# Patient Record
Sex: Female | Born: 1950
Health system: Southern US, Community
[De-identification: ages and names within clinical notes are randomized; demographics above are authoritative.]

## PROBLEM LIST (undated history)

## (undated) DIAGNOSIS — Z973 Presence of spectacles and contact lenses: Secondary | ICD-10-CM

## (undated) DIAGNOSIS — J84112 Idiopathic pulmonary fibrosis: Secondary | ICD-10-CM

## (undated) DIAGNOSIS — E039 Hypothyroidism, unspecified: Secondary | ICD-10-CM

## (undated) DIAGNOSIS — M1990 Unspecified osteoarthritis, unspecified site: Secondary | ICD-10-CM

## (undated) DIAGNOSIS — F419 Anxiety disorder, unspecified: Secondary | ICD-10-CM

## (undated) HISTORY — PX: OTHER SURGICAL HISTORY: SHX169

---

## 2001-06-20 HISTORY — PX: VAGINAL HYSTERECTOMY: SUR661

## 2015-08-25 DIAGNOSIS — Z1231 Encounter for screening mammogram for malignant neoplasm of breast: Secondary | ICD-10-CM | POA: Diagnosis not present

## 2016-04-11 DIAGNOSIS — Z23 Encounter for immunization: Secondary | ICD-10-CM | POA: Diagnosis not present

## 2016-05-25 DIAGNOSIS — Z23 Encounter for immunization: Secondary | ICD-10-CM | POA: Diagnosis not present

## 2016-05-25 DIAGNOSIS — Z0000 Encounter for general adult medical examination without abnormal findings: Secondary | ICD-10-CM | POA: Diagnosis not present

## 2016-05-25 DIAGNOSIS — F329 Major depressive disorder, single episode, unspecified: Secondary | ICD-10-CM | POA: Diagnosis not present

## 2016-05-25 DIAGNOSIS — M5030 Other cervical disc degeneration, unspecified cervical region: Secondary | ICD-10-CM | POA: Diagnosis not present

## 2016-05-25 DIAGNOSIS — E7800 Pure hypercholesterolemia, unspecified: Secondary | ICD-10-CM | POA: Diagnosis not present

## 2016-05-25 DIAGNOSIS — F411 Generalized anxiety disorder: Secondary | ICD-10-CM | POA: Diagnosis not present

## 2016-05-25 DIAGNOSIS — E039 Hypothyroidism, unspecified: Secondary | ICD-10-CM | POA: Diagnosis not present

## 2016-05-25 DIAGNOSIS — Z6826 Body mass index (BMI) 26.0-26.9, adult: Secondary | ICD-10-CM | POA: Diagnosis not present

## 2016-08-01 DIAGNOSIS — H6692 Otitis media, unspecified, left ear: Secondary | ICD-10-CM | POA: Diagnosis not present

## 2016-08-01 DIAGNOSIS — H7292 Unspecified perforation of tympanic membrane, left ear: Secondary | ICD-10-CM | POA: Diagnosis not present

## 2016-08-05 DIAGNOSIS — E7800 Pure hypercholesterolemia, unspecified: Secondary | ICD-10-CM | POA: Diagnosis not present

## 2016-09-01 ENCOUNTER — Ambulatory Visit (INDEPENDENT_AMBULATORY_CARE_PROVIDER_SITE_OTHER): Payer: Medicare Other | Admitting: Otolaryngology

## 2016-09-01 DIAGNOSIS — H6983 Other specified disorders of Eustachian tube, bilateral: Secondary | ICD-10-CM

## 2016-09-01 DIAGNOSIS — H903 Sensorineural hearing loss, bilateral: Secondary | ICD-10-CM

## 2017-03-21 DIAGNOSIS — Z1231 Encounter for screening mammogram for malignant neoplasm of breast: Secondary | ICD-10-CM | POA: Diagnosis not present

## 2017-04-05 DIAGNOSIS — Z23 Encounter for immunization: Secondary | ICD-10-CM | POA: Diagnosis not present

## 2017-04-26 DIAGNOSIS — R194 Change in bowel habit: Secondary | ICD-10-CM | POA: Diagnosis not present

## 2017-04-26 DIAGNOSIS — M25552 Pain in left hip: Secondary | ICD-10-CM | POA: Diagnosis not present

## 2017-04-26 DIAGNOSIS — M25551 Pain in right hip: Secondary | ICD-10-CM | POA: Diagnosis not present

## 2017-04-26 DIAGNOSIS — R1084 Generalized abdominal pain: Secondary | ICD-10-CM | POA: Diagnosis not present

## 2017-04-26 DIAGNOSIS — E039 Hypothyroidism, unspecified: Secondary | ICD-10-CM | POA: Diagnosis not present

## 2017-05-05 DIAGNOSIS — K921 Melena: Secondary | ICD-10-CM | POA: Diagnosis not present

## 2017-05-31 DIAGNOSIS — M76899 Other specified enthesopathies of unspecified lower limb, excluding foot: Secondary | ICD-10-CM | POA: Insufficient documentation

## 2017-05-31 DIAGNOSIS — M160 Bilateral primary osteoarthritis of hip: Secondary | ICD-10-CM | POA: Diagnosis not present

## 2017-05-31 DIAGNOSIS — M7061 Trochanteric bursitis, right hip: Secondary | ICD-10-CM | POA: Diagnosis not present

## 2017-05-31 DIAGNOSIS — M25552 Pain in left hip: Secondary | ICD-10-CM | POA: Diagnosis not present

## 2017-05-31 DIAGNOSIS — M25551 Pain in right hip: Secondary | ICD-10-CM | POA: Diagnosis not present

## 2017-07-14 DIAGNOSIS — M7062 Trochanteric bursitis, left hip: Secondary | ICD-10-CM | POA: Diagnosis not present

## 2017-07-14 DIAGNOSIS — M7061 Trochanteric bursitis, right hip: Secondary | ICD-10-CM | POA: Diagnosis not present

## 2017-08-30 DIAGNOSIS — M25551 Pain in right hip: Secondary | ICD-10-CM | POA: Diagnosis not present

## 2017-08-30 DIAGNOSIS — M160 Bilateral primary osteoarthritis of hip: Secondary | ICD-10-CM | POA: Diagnosis not present

## 2017-08-30 DIAGNOSIS — M7062 Trochanteric bursitis, left hip: Secondary | ICD-10-CM | POA: Diagnosis not present

## 2017-08-30 DIAGNOSIS — M5410 Radiculopathy, site unspecified: Secondary | ICD-10-CM | POA: Diagnosis not present

## 2017-08-30 DIAGNOSIS — M25552 Pain in left hip: Secondary | ICD-10-CM | POA: Diagnosis not present

## 2017-08-30 DIAGNOSIS — M7061 Trochanteric bursitis, right hip: Secondary | ICD-10-CM | POA: Diagnosis not present

## 2017-10-18 DIAGNOSIS — M25552 Pain in left hip: Secondary | ICD-10-CM | POA: Diagnosis not present

## 2017-10-18 DIAGNOSIS — M7061 Trochanteric bursitis, right hip: Secondary | ICD-10-CM | POA: Diagnosis not present

## 2017-10-18 DIAGNOSIS — M25551 Pain in right hip: Secondary | ICD-10-CM | POA: Diagnosis not present

## 2017-10-18 DIAGNOSIS — M160 Bilateral primary osteoarthritis of hip: Secondary | ICD-10-CM | POA: Diagnosis not present

## 2017-10-30 DIAGNOSIS — K5900 Constipation, unspecified: Secondary | ICD-10-CM | POA: Diagnosis not present

## 2017-10-30 DIAGNOSIS — Z780 Asymptomatic menopausal state: Secondary | ICD-10-CM | POA: Diagnosis not present

## 2017-10-30 DIAGNOSIS — E039 Hypothyroidism, unspecified: Secondary | ICD-10-CM | POA: Diagnosis not present

## 2017-10-30 DIAGNOSIS — F418 Other specified anxiety disorders: Secondary | ICD-10-CM | POA: Diagnosis not present

## 2017-10-30 DIAGNOSIS — E7800 Pure hypercholesterolemia, unspecified: Secondary | ICD-10-CM | POA: Diagnosis not present

## 2017-10-30 DIAGNOSIS — G479 Sleep disorder, unspecified: Secondary | ICD-10-CM | POA: Diagnosis not present

## 2017-10-30 DIAGNOSIS — M25552 Pain in left hip: Secondary | ICD-10-CM | POA: Diagnosis not present

## 2017-10-30 DIAGNOSIS — Z1159 Encounter for screening for other viral diseases: Secondary | ICD-10-CM | POA: Diagnosis not present

## 2017-10-30 DIAGNOSIS — Z23 Encounter for immunization: Secondary | ICD-10-CM | POA: Diagnosis not present

## 2017-10-30 DIAGNOSIS — Z13228 Encounter for screening for other metabolic disorders: Secondary | ICD-10-CM | POA: Diagnosis not present

## 2017-10-30 DIAGNOSIS — M25551 Pain in right hip: Secondary | ICD-10-CM | POA: Diagnosis not present

## 2017-10-30 DIAGNOSIS — Z130 Encounter for screening for diseases of the blood and blood-forming organs and certain disorders involving the immune mechanism: Secondary | ICD-10-CM | POA: Diagnosis not present

## 2017-11-16 DIAGNOSIS — R0602 Shortness of breath: Secondary | ICD-10-CM | POA: Diagnosis not present

## 2017-11-16 DIAGNOSIS — R05 Cough: Secondary | ICD-10-CM | POA: Diagnosis not present

## 2017-11-16 DIAGNOSIS — J40 Bronchitis, not specified as acute or chronic: Secondary | ICD-10-CM | POA: Diagnosis not present

## 2017-11-16 DIAGNOSIS — H10023 Other mucopurulent conjunctivitis, bilateral: Secondary | ICD-10-CM | POA: Diagnosis not present

## 2017-11-16 DIAGNOSIS — R079 Chest pain, unspecified: Secondary | ICD-10-CM | POA: Diagnosis not present

## 2017-11-16 DIAGNOSIS — R062 Wheezing: Secondary | ICD-10-CM | POA: Diagnosis not present

## 2017-11-29 DIAGNOSIS — M160 Bilateral primary osteoarthritis of hip: Secondary | ICD-10-CM | POA: Diagnosis not present

## 2017-11-29 DIAGNOSIS — Z6825 Body mass index (BMI) 25.0-25.9, adult: Secondary | ICD-10-CM | POA: Diagnosis not present

## 2017-11-29 DIAGNOSIS — M25551 Pain in right hip: Secondary | ICD-10-CM | POA: Diagnosis not present

## 2017-11-29 DIAGNOSIS — E039 Hypothyroidism, unspecified: Secondary | ICD-10-CM | POA: Diagnosis not present

## 2017-11-29 DIAGNOSIS — G4700 Insomnia, unspecified: Secondary | ICD-10-CM | POA: Diagnosis not present

## 2017-11-29 DIAGNOSIS — R079 Chest pain, unspecified: Secondary | ICD-10-CM | POA: Diagnosis not present

## 2017-11-29 DIAGNOSIS — M7061 Trochanteric bursitis, right hip: Secondary | ICD-10-CM | POA: Diagnosis not present

## 2017-11-29 DIAGNOSIS — M7062 Trochanteric bursitis, left hip: Secondary | ICD-10-CM | POA: Diagnosis not present

## 2017-11-29 DIAGNOSIS — F329 Major depressive disorder, single episode, unspecified: Secondary | ICD-10-CM | POA: Diagnosis not present

## 2017-11-29 DIAGNOSIS — R942 Abnormal results of pulmonary function studies: Secondary | ICD-10-CM | POA: Diagnosis not present

## 2017-12-14 DIAGNOSIS — R918 Other nonspecific abnormal finding of lung field: Secondary | ICD-10-CM | POA: Diagnosis not present

## 2018-01-08 DIAGNOSIS — R911 Solitary pulmonary nodule: Secondary | ICD-10-CM | POA: Diagnosis not present

## 2018-01-08 DIAGNOSIS — R942 Abnormal results of pulmonary function studies: Secondary | ICD-10-CM | POA: Diagnosis not present

## 2018-01-12 DIAGNOSIS — R0989 Other specified symptoms and signs involving the circulatory and respiratory systems: Secondary | ICD-10-CM | POA: Diagnosis not present

## 2018-01-12 DIAGNOSIS — R0602 Shortness of breath: Secondary | ICD-10-CM | POA: Diagnosis not present

## 2018-01-12 LAB — PULMONARY FUNCTION TEST

## 2018-01-29 DIAGNOSIS — M7061 Trochanteric bursitis, right hip: Secondary | ICD-10-CM | POA: Diagnosis not present

## 2018-03-12 DIAGNOSIS — L03116 Cellulitis of left lower limb: Secondary | ICD-10-CM | POA: Diagnosis not present

## 2018-03-12 DIAGNOSIS — Z23 Encounter for immunization: Secondary | ICD-10-CM | POA: Diagnosis not present

## 2018-03-12 DIAGNOSIS — S81802A Unspecified open wound, left lower leg, initial encounter: Secondary | ICD-10-CM | POA: Diagnosis not present

## 2018-03-19 DIAGNOSIS — E039 Hypothyroidism, unspecified: Secondary | ICD-10-CM | POA: Diagnosis not present

## 2018-03-19 DIAGNOSIS — R739 Hyperglycemia, unspecified: Secondary | ICD-10-CM | POA: Diagnosis not present

## 2018-03-19 DIAGNOSIS — R942 Abnormal results of pulmonary function studies: Secondary | ICD-10-CM | POA: Diagnosis not present

## 2018-03-23 DIAGNOSIS — E039 Hypothyroidism, unspecified: Secondary | ICD-10-CM | POA: Diagnosis not present

## 2018-03-23 DIAGNOSIS — Z23 Encounter for immunization: Secondary | ICD-10-CM | POA: Diagnosis not present

## 2018-03-23 DIAGNOSIS — F329 Major depressive disorder, single episode, unspecified: Secondary | ICD-10-CM | POA: Diagnosis not present

## 2018-03-23 DIAGNOSIS — R942 Abnormal results of pulmonary function studies: Secondary | ICD-10-CM | POA: Diagnosis not present

## 2018-03-23 DIAGNOSIS — G4700 Insomnia, unspecified: Secondary | ICD-10-CM | POA: Diagnosis not present

## 2018-03-23 DIAGNOSIS — Z6825 Body mass index (BMI) 25.0-25.9, adult: Secondary | ICD-10-CM | POA: Diagnosis not present

## 2018-03-29 DIAGNOSIS — M7062 Trochanteric bursitis, left hip: Secondary | ICD-10-CM | POA: Diagnosis not present

## 2018-03-29 DIAGNOSIS — M7061 Trochanteric bursitis, right hip: Secondary | ICD-10-CM | POA: Diagnosis not present

## 2018-05-08 DIAGNOSIS — E039 Hypothyroidism, unspecified: Secondary | ICD-10-CM | POA: Diagnosis not present

## 2018-05-27 DIAGNOSIS — M7062 Trochanteric bursitis, left hip: Secondary | ICD-10-CM | POA: Insufficient documentation

## 2018-05-27 DIAGNOSIS — M7061 Trochanteric bursitis, right hip: Secondary | ICD-10-CM | POA: Insufficient documentation

## 2018-05-28 DIAGNOSIS — M7062 Trochanteric bursitis, left hip: Secondary | ICD-10-CM | POA: Diagnosis not present

## 2018-05-28 DIAGNOSIS — M1611 Unilateral primary osteoarthritis, right hip: Secondary | ICD-10-CM | POA: Diagnosis not present

## 2018-05-28 DIAGNOSIS — M7061 Trochanteric bursitis, right hip: Secondary | ICD-10-CM | POA: Diagnosis not present

## 2018-05-28 DIAGNOSIS — M169 Osteoarthritis of hip, unspecified: Secondary | ICD-10-CM | POA: Insufficient documentation

## 2018-06-22 NOTE — H&P (Signed)
TOTAL HIP ADMISSION H&P  Patient is admitted for right total hip arthroplasty.  Subjective:  Chief Complaint: right hip pain  HPI: Debra Jimenez, 68 y.o. female, has a history of pain and functional disability in the right hip(s) due to arthritis and patient has failed non-surgical conservative treatments for greater than 12 weeks to include NSAID's and/or analgesics and activity modification.  Onset of symptoms was gradual starting 1 years ago with gradually worsening course since that time.The patient noted no past surgery on the right hip(s).  Patient currently rates pain in the right hip at 10 out of 10 with activity. Patient has worsening of pain with activity and weight bearing and pain that interfers with activities of daily living.  BILATERAL HIP X-RAY images were ordered, obtained, and interpreted on 05/28/2018 by Dr. Charlann Boxer. Images demonstrate no fracture, no dislocation and advanced degenerative changes with complete loss of joint space in the right hip with periarticular osteophytes and subchondral cystic changes. Her left hip joint space is relatively better preserved consistent with her examination.  This condition presents safety issues increasing the risk of falls.  There is no current active infection.  There are no active problems to display for this patient.  History reviewed. No pertinent past medical history.  History reviewed. No pertinent surgical history.  No current facility-administered medications for this encounter.    Current Outpatient Medications  Medication Sig Dispense Refill Last Dose  . escitalopram (LEXAPRO) 10 MG tablet Take 10 mg by mouth daily.     Marland Kitchen ibuprofen (ADVIL,MOTRIN) 800 MG tablet Take 800 mg by mouth 2 (two) times daily as needed for moderate pain.     Marland Kitchen levothyroxine (SYNTHROID, LEVOTHROID) 88 MCG tablet Take 88 mcg by mouth daily before breakfast.     . zolpidem (AMBIEN) 10 MG tablet Take 10 mg by mouth at bedtime.      No Known Allergies    Social History   Tobacco Use  . Smoking status: Not on file  Substance Use Topics  . Alcohol use: Not on file    History reviewed. No pertinent family history.   ROS  Objective:  Physical Exam  Vital signs in last 24 hours: BP: 132/85 mmHg Pulse: 72 bpm  Labs:   There is no height or weight on file to calculate BMI.   Imaging Review Plain radiographs demonstrate severe degenerative joint disease of the right hip(s). The bone quality appears to be adequate for age and reported activity level.    Preoperative templating of the joint replacement has been completed, documented, and submitted to the Operating Room personnel in order to optimize intra-operative equipment management.     Assessment/Plan:  End stage arthritis, right hip(s)  The patient history, physical examination, clinical judgement of the provider and imaging studies are consistent with end stage degenerative joint disease of the right hip(s) and total hip arthroplasty is deemed medically necessary. The treatment options including medical management, injection therapy, arthroscopy and arthroplasty were discussed at length. The risks and benefits of total hip arthroplasty were presented and reviewed. The risks due to aseptic loosening, infection, stiffness, dislocation/subluxation,  thromboembolic complications and other imponderables were discussed.  The patient acknowledged the explanation, agreed to proceed with the plan and consent was signed. Patient is being admitted for inpatient treatment for surgery, pain control, PT, OT, prophylactic antibiotics, VTE prophylaxis, progressive ambulation and ADL's and discharge planning.The patient is planning to be discharged home.  Dennie Bible, PA-C Orthopedic Surgery [facility] [place]

## 2018-06-25 DIAGNOSIS — Z6825 Body mass index (BMI) 25.0-25.9, adult: Secondary | ICD-10-CM | POA: Diagnosis not present

## 2018-06-25 DIAGNOSIS — H6092 Unspecified otitis externa, left ear: Secondary | ICD-10-CM | POA: Diagnosis not present

## 2018-07-02 DIAGNOSIS — Z1231 Encounter for screening mammogram for malignant neoplasm of breast: Secondary | ICD-10-CM | POA: Diagnosis not present

## 2018-07-09 ENCOUNTER — Encounter: Payer: Self-pay | Admitting: Student

## 2018-07-11 ENCOUNTER — Encounter (HOSPITAL_COMMUNITY): Payer: Self-pay

## 2018-07-11 ENCOUNTER — Encounter (HOSPITAL_COMMUNITY)
Admission: RE | Admit: 2018-07-11 | Discharge: 2018-07-11 | Disposition: A | Discharge status: Home / Self Care | Payer: Medicare Other | Source: Ambulatory Visit | Attending: Orthopedic Surgery | Admitting: Orthopedic Surgery

## 2018-07-11 ENCOUNTER — Other Ambulatory Visit: Payer: Self-pay

## 2018-07-11 DIAGNOSIS — Z01812 Encounter for preprocedural laboratory examination: Secondary | ICD-10-CM | POA: Diagnosis not present

## 2018-07-11 HISTORY — DX: Hypothyroidism, unspecified: E03.9

## 2018-07-11 HISTORY — DX: Presence of spectacles and contact lenses: Z97.3

## 2018-07-11 HISTORY — DX: Unspecified osteoarthritis, unspecified site: M19.90

## 2018-07-11 LAB — SURGICAL PCR SCREEN
MRSA, PCR: NEGATIVE
Staphylococcus aureus: POSITIVE — AB

## 2018-07-11 LAB — CBC
HCT: 40 % (ref 36.0–46.0)
Hemoglobin: 12.8 g/dL (ref 12.0–15.0)
MCH: 30.3 pg (ref 26.0–34.0)
MCHC: 32 g/dL (ref 30.0–36.0)
MCV: 94.6 fL (ref 80.0–100.0)
Platelets: 259 10*3/uL (ref 150–400)
RBC: 4.23 MIL/uL (ref 3.87–5.11)
RDW: 12.2 % (ref 11.5–15.5)
WBC: 8.5 10*3/uL (ref 4.0–10.5)
nRBC: 0 % (ref 0.0–0.2)

## 2018-07-11 LAB — ABO/RH: ABO/RH(D): A NEG

## 2018-07-11 NOTE — Patient Instructions (Signed)
Debra Jimenez  Feb 20, 1951    Your procedure is scheduled on:  07-17-2018   Report to [facility] Main  Entrance,  Report to admitting at  11:45 AM    Call this number if you have problems the morning of surgery [phone]        Remember: Do not eat food After Midnight.   Clear liquid diet from midnight until 8:15 AM day of surgery.  Nothing by mouth after 8:15 AM including water,candy,gum,mints.  BRUSH YOUR TEETH MORNING OF SURGERY AND RINSE YOUR MOUTH OUT       Take these medicines the morning of surgery with A SIP OF WATER:   Levothyroxine,  Escitalopram (lexapro)                                    You may not have any metal on your body including hair pins and               piercings  Do not wear jewelry, make-up, lotions, powders or perfumes, deodorant              Do not wear nail polish.  Do not shave  48 hours prior to surgery.                Do not bring valuables to the hospital. [facility] IS NOT             RESPONSIBLE   FOR VALUABLES.  Contacts, dentures or bridgework may not be worn into surgery.  Leave suitcase in the car. After surgery it may be brought to your room.    _____________________________________________________________________      CLEAR LIQUID DIET   Foods Allowed                                                                     Foods Excluded  Coffee and tea, regular and decaf                             liquids that you cannot  Plain Jell-O in any flavor                                             see through such as: Fruit ices (not with fruit pulp)                                     milk, soups, orange juice  Iced Popsicles                                    All solid food Carbonated beverages, regular and diet  Cranberry, grape and apple juices Sports drinks like Gatorade Lightly seasoned clear broth or consume(fat free) Sugar, honey syrup  Sample  Menu Breakfast                                Lunch                                     Supper Cranberry juice                    Beef broth                            Chicken broth Jell-O                                     Grape juice                           Apple juice Coffee or tea                        Jell-O                                      Popsicle                                                Coffee or tea                        Coffee or tea  _____________________________________________________________________            [facility] - Preparing for Surgery Before surgery, you can play an important role.  Because skin is not sterile, your skin needs to be as free of germs as possible.  You can reduce the number of germs on your skin by washing with CHG (chlorahexidine gluconate) soap before surgery.  CHG is an antiseptic cleaner which kills germs and bonds with the skin to continue killing germs even after washing. Please DO NOT use if you have an allergy to CHG or antibacterial soaps.  If your skin becomes reddened/irritated stop using the CHG and inform your nurse when you arrive at Short Stay. Do not shave (including legs and underarms) for at least 48 hours prior to the first CHG shower.  You may shave your face/neck. Please follow these instructions carefully:  1.  Shower with CHG Soap the night before surgery and the  morning of Surgery.  2.  If you choose to wash your hair, wash your hair first as usual with your  normal  shampoo.  3.  After you shampoo, rinse your hair and body thoroughly to remove the  shampoo.                            4.  Use CHG as you would any other liquid soap.  You can apply chg  directly  to the skin and wash                       Gently with a scrungie or clean washcloth.  5.  Apply the CHG Soap to your body ONLY FROM THE NECK DOWN.   Do not use on face/ open                           Wound or open sores. Avoid contact with eyes, ears mouth  and genitals (private parts).                       Wash face,  Genitals (private parts) with your normal soap.             6.  Wash thoroughly, paying special attention to the area where your surgery  will be performed.  7.  Thoroughly rinse your body with warm water from the neck down.  8.  DO NOT shower/wash with your normal soap after using and rinsing off  the CHG Soap.             9.  Pat yourself dry with a clean towel.            10.  Wear clean pajamas.            11.  Place clean sheets on your bed the night of your first shower and do not  sleep with pets. Day of Surgery : Do not apply any lotions/deodorants the morning of surgery.  Please wear clean clothes to the hospital/surgery center.  FAILURE TO FOLLOW THESE INSTRUCTIONS MAY RESULT IN THE CANCELLATION OF YOUR SURGERY PATIENT SIGNATURE_________________________________  NURSE SIGNATURE__________________________________  ________________________________________________________________________   Debra Jimenez  An incentive spirometer is a tool that can help keep your lungs clear and active. This tool measures how well you are filling your lungs with each breath. Taking long deep breaths may help reverse or decrease the chance of developing breathing (pulmonary) problems (especially infection) following:  A long period of time when you are unable to move or be active. BEFORE THE PROCEDURE   If the spirometer includes an indicator to show your best effort, your nurse or respiratory therapist will set it to a desired goal.  If possible, sit up straight or lean slightly forward. Try not to slouch.  Hold the incentive spirometer in an upright position. INSTRUCTIONS FOR USE  1. Sit on the edge of your bed if possible, or sit up as far as you can in bed or on a chair. 2. Hold the incentive spirometer in an upright position. 3. Breathe out normally. 4. Place the mouthpiece in your mouth and seal your lips tightly around  it. 5. Breathe in slowly and as deeply as possible, raising the piston or the ball toward the top of the column. 6. Hold your breath for 3-5 seconds or for as long as possible. Allow the piston or ball to fall to the bottom of the column. 7. Remove the mouthpiece from your mouth and breathe out normally. 8. Rest for a few seconds and repeat Steps 1 through 7 at least 10 times every 1-2 hours when you are awake. Take your time and take a few normal breaths between deep breaths. 9. The spirometer may include an indicator to show your best effort. Use the indicator as a goal to work toward during each repetition. 10. After each  set of 10 deep breaths, practice coughing to be sure your lungs are clear. If you have an incision (the cut made at the time of surgery), support your incision when coughing by placing a pillow or rolled up towels firmly against it. Once you are able to get out of bed, walk around indoors and cough well. You may stop using the incentive spirometer when instructed by your caregiver.  RISKS AND COMPLICATIONS  Take your time so you do not get dizzy or light-headed.  If you are in pain, you may need to take or ask for pain medication before doing incentive spirometry. It is harder to take a deep breath if you are having pain. AFTER USE  Rest and breathe slowly and easily.  It can be helpful to keep track of a log of your progress. Your caregiver can provide you with a simple table to help with this. If you are using the spirometer at home, follow these instructions: SEEK MEDICAL CARE IF:   You are having difficultly using the spirometer.  You have trouble using the spirometer as often as instructed.  Your pain medication is not giving enough relief while using the spirometer.  You develop fever of 100.5 F (38.1 C) or higher. SEEK IMMEDIATE MEDICAL CARE IF:   You cough up bloody sputum that had not been present before.  You develop fever of 102 F (38.9 C) or  greater.  You develop worsening pain at or near the incision site. MAKE SURE YOU:   Understand these instructions.  Will watch your condition.  Will get help right away if you are not doing well or get worse. Document Released: 10/17/2006 Document Revised: 08/29/2011 Document Reviewed: 12/18/2006 ExitCare Patient Information 2014 [employer], [place].   ________________________________________________________________________  WHAT IS A BLOOD TRANSFUSION? Blood Transfusion Information  A transfusion is the replacement of blood or some of its parts. Blood is made up of multiple cells which provide different functions.  Red blood cells carry oxygen and are used for blood loss replacement.  White blood cells fight against infection.  Platelets control bleeding.  Plasma helps clot blood.  Other blood products are available for specialized needs, such as hemophilia or other clotting disorders. BEFORE THE TRANSFUSION  Who gives blood for transfusions?   Healthy volunteers who are fully evaluated to make sure their blood is safe. This is blood bank blood. Transfusion therapy is the safest it has ever been in the practice of medicine. Before blood is taken from a donor, a complete history is taken to make sure that person has no history of diseases nor engages in risky social behavior (examples are intravenous drug use or sexual activity with multiple partners). The donor's travel history is screened to minimize risk of transmitting infections, such as malaria. The donated blood is tested for signs of infectious diseases, such as HIV and hepatitis. The blood is then tested to be sure it is compatible with you in order to minimize the chance of a transfusion reaction. If you or a relative donates blood, this is often done in anticipation of surgery and is not appropriate for emergency situations. It takes many days to process the donated blood. RISKS AND COMPLICATIONS Although transfusion therapy  is very safe and saves many lives, the main dangers of transfusion include:   Getting an infectious disease.  Developing a transfusion reaction. This is an allergic reaction to something in the blood you were given. Every precaution is taken to prevent this. The decision to have  a blood transfusion has been considered carefully by your caregiver before blood is given. Blood is not given unless the benefits outweigh the risks. AFTER THE TRANSFUSION  Right after receiving a blood transfusion, you will usually feel much better and more energetic. This is especially true if your red blood cells have gotten low (anemic). The transfusion raises the level of the red blood cells which carry oxygen, and this usually causes an energy increase.  The nurse administering the transfusion will monitor you carefully for complications. HOME CARE INSTRUCTIONS  No special instructions are needed after a transfusion. You may find your energy is better. Speak with your caregiver about any limitations on activity for underlying diseases you may have. SEEK MEDICAL CARE IF:   Your condition is not improving after your transfusion.  You develop redness or irritation at the intravenous (IV) site. SEEK IMMEDIATE MEDICAL CARE IF:  Any of the following symptoms occur over the next 12 hours:  Shaking chills.  You have a temperature by mouth above 102 F (38.9 C), not controlled by medicine.  Chest, back, or muscle pain.  People around you feel you are not acting correctly or are confused.  Shortness of breath or difficulty breathing.  Dizziness and fainting.  You get a rash or develop hives.  You have a decrease in urine output.  Your urine turns a dark color or changes to pink, red, or brown. Any of the following symptoms occur over the next 10 days:  You have a temperature by mouth above 102 F (38.9 C), not controlled by medicine.  Shortness of breath.  Weakness after normal activity.  The white  part of the eye turns yellow (jaundice).  You have a decrease in the amount of urine or are urinating less often.  Your urine turns a dark color or changes to pink, red, or brown. Document Released: 06/03/2000 Document Revised: 08/29/2011 Document Reviewed: 01/21/2008 [facility] Patient Information 2014 [place], [place].  _______________________________________________________________________

## 2018-07-12 NOTE — Progress Notes (Signed)
PCR result routed to dr Charlann Boxer in epic.

## 2018-07-17 ENCOUNTER — Observation Stay (HOSPITAL_COMMUNITY): Payer: Medicare Other

## 2018-07-17 ENCOUNTER — Inpatient Hospital Stay (HOSPITAL_COMMUNITY): Payer: Medicare Other | Admitting: Certified Registered"

## 2018-07-17 ENCOUNTER — Other Ambulatory Visit: Payer: Self-pay

## 2018-07-17 ENCOUNTER — Observation Stay (HOSPITAL_COMMUNITY)
Admission: RE | Admit: 2018-07-17 | Discharge: 2018-07-18 | Disposition: A | Discharge status: Home / Self Care | Payer: Medicare Other | Attending: Orthopedic Surgery | Admitting: Orthopedic Surgery

## 2018-07-17 ENCOUNTER — Inpatient Hospital Stay (HOSPITAL_COMMUNITY): Payer: Medicare Other

## 2018-07-17 ENCOUNTER — Encounter (HOSPITAL_COMMUNITY): Payer: Self-pay | Admitting: *Deleted

## 2018-07-17 ENCOUNTER — Inpatient Hospital Stay (HOSPITAL_COMMUNITY): Payer: Medicare Other | Admitting: Physician Assistant

## 2018-07-17 ENCOUNTER — Encounter (HOSPITAL_COMMUNITY)
Admission: RE | Disposition: A | Discharge status: Home / Self Care | Payer: Self-pay | Source: Home / Self Care | Attending: Orthopedic Surgery

## 2018-07-17 DIAGNOSIS — E039 Hypothyroidism, unspecified: Secondary | ICD-10-CM | POA: Diagnosis not present

## 2018-07-17 DIAGNOSIS — Z79890 Hormone replacement therapy (postmenopausal): Secondary | ICD-10-CM | POA: Diagnosis not present

## 2018-07-17 DIAGNOSIS — Z79899 Other long term (current) drug therapy: Secondary | ICD-10-CM | POA: Insufficient documentation

## 2018-07-17 DIAGNOSIS — Z419 Encounter for procedure for purposes other than remedying health state, unspecified: Secondary | ICD-10-CM

## 2018-07-17 DIAGNOSIS — Z87891 Personal history of nicotine dependence: Secondary | ICD-10-CM | POA: Diagnosis not present

## 2018-07-17 DIAGNOSIS — Z96641 Presence of right artificial hip joint: Secondary | ICD-10-CM

## 2018-07-17 DIAGNOSIS — M1611 Unilateral primary osteoarthritis, right hip: Secondary | ICD-10-CM | POA: Diagnosis not present

## 2018-07-17 DIAGNOSIS — Z96643 Presence of artificial hip joint, bilateral: Secondary | ICD-10-CM | POA: Diagnosis not present

## 2018-07-17 DIAGNOSIS — Z471 Aftercare following joint replacement surgery: Secondary | ICD-10-CM | POA: Diagnosis not present

## 2018-07-17 DIAGNOSIS — Z96649 Presence of unspecified artificial hip joint: Secondary | ICD-10-CM

## 2018-07-17 HISTORY — PX: TOTAL HIP ARTHROPLASTY: SHX124

## 2018-07-17 LAB — TYPE AND SCREEN
ABO/RH(D): A NEG
Antibody Screen: NEGATIVE

## 2018-07-17 SURGERY — ARTHROPLASTY, HIP, TOTAL, ANTERIOR APPROACH
Anesthesia: Monitor Anesthesia Care | Site: Hip | Laterality: Right

## 2018-07-17 MED ORDER — HYDROMORPHONE HCL 1 MG/ML IJ SOLN
INTRAMUSCULAR | Status: AC
  Filled 2018-07-17: qty 1

## 2018-07-17 MED ORDER — ACETAMINOPHEN 500 MG PO TABS
1000.0000 mg | ORAL_TABLET | Freq: Once | ORAL | Status: AC
  Administered 2018-07-17: 1000 mg via ORAL
  Filled 2018-07-17: qty 2

## 2018-07-17 MED ORDER — METOCLOPRAMIDE HCL 5 MG/ML IJ SOLN: 5.0000 mg | Freq: Three times a day (TID) | INTRAMUSCULAR | Status: DC | PRN

## 2018-07-17 MED ORDER — SODIUM CHLORIDE 0.9 % IV SOLN
INTRAVENOUS | Status: DC
  Administered 2018-07-17 – 2018-07-18 (×2): via INTRAVENOUS

## 2018-07-17 MED ORDER — DEXAMETHASONE SODIUM PHOSPHATE 10 MG/ML IJ SOLN
10.0000 mg | Freq: Once | INTRAMUSCULAR | Status: AC
  Administered 2018-07-17: 10 mg via INTRAVENOUS

## 2018-07-17 MED ORDER — PROPOFOL 10 MG/ML IV BOLUS
INTRAVENOUS | Status: AC
  Filled 2018-07-17: qty 40

## 2018-07-17 MED ORDER — SODIUM CHLORIDE 0.9 % IR SOLN
Status: DC | PRN
  Administered 2018-07-17: 1000 mL

## 2018-07-17 MED ORDER — FENTANYL CITRATE (PF) 100 MCG/2ML IJ SOLN
INTRAMUSCULAR | Status: DC | PRN
  Administered 2018-07-17 (×2): 25 ug via INTRAVENOUS

## 2018-07-17 MED ORDER — LACTATED RINGERS IV SOLN
INTRAVENOUS | Status: DC
  Administered 2018-07-17 (×3): via INTRAVENOUS

## 2018-07-17 MED ORDER — STERILE WATER FOR IRRIGATION IR SOLN
Status: DC | PRN
  Administered 2018-07-17: 2000 mL

## 2018-07-17 MED ORDER — METOCLOPRAMIDE HCL 5 MG PO TABS: 5.0000 mg | ORAL_TABLET | Freq: Three times a day (TID) | ORAL | Status: DC | PRN

## 2018-07-17 MED ORDER — CEFAZOLIN SODIUM-DEXTROSE 2-4 GM/100ML-% IV SOLN
2.0000 g | Freq: Four times a day (QID) | INTRAVENOUS | Status: AC
  Administered 2018-07-17 – 2018-07-18 (×2): 2 g via INTRAVENOUS
  Filled 2018-07-17 (×2): qty 100

## 2018-07-17 MED ORDER — ONDANSETRON HCL 4 MG/2ML IJ SOLN: 4.0000 mg | Freq: Four times a day (QID) | INTRAMUSCULAR | Status: DC | PRN

## 2018-07-17 MED ORDER — ZOLPIDEM TARTRATE 5 MG PO TABS
5.0000 mg | ORAL_TABLET | Freq: Every day | ORAL | Status: DC
  Administered 2018-07-17: 5 mg via ORAL
  Filled 2018-07-17: qty 1

## 2018-07-17 MED ORDER — METHOCARBAMOL 500 MG PO TABS
500.0000 mg | ORAL_TABLET | Freq: Four times a day (QID) | ORAL | Status: DC | PRN
  Administered 2018-07-17: 500 mg via ORAL
  Filled 2018-07-17: qty 1

## 2018-07-17 MED ORDER — ONDANSETRON HCL 4 MG PO TABS: 4.0000 mg | ORAL_TABLET | Freq: Four times a day (QID) | ORAL | Status: DC | PRN

## 2018-07-17 MED ORDER — DEXAMETHASONE SODIUM PHOSPHATE 10 MG/ML IJ SOLN: INTRAMUSCULAR | Status: DC | PRN

## 2018-07-17 MED ORDER — HYDROMORPHONE HCL 1 MG/ML IJ SOLN
0.5000 mg | INTRAMUSCULAR | Status: DC | PRN
  Administered 2018-07-17: 1 mg via INTRAVENOUS
  Filled 2018-07-17: qty 1

## 2018-07-17 MED ORDER — BISACODYL 10 MG RE SUPP: 10.0000 mg | Freq: Every day | RECTAL | Status: DC | PRN

## 2018-07-17 MED ORDER — MAGNESIUM CITRATE PO SOLN: 1.0000 | Freq: Once | ORAL | Status: DC | PRN

## 2018-07-17 MED ORDER — DEXAMETHASONE SODIUM PHOSPHATE 10 MG/ML IJ SOLN
10.0000 mg | Freq: Once | INTRAMUSCULAR | Status: AC
  Administered 2018-07-18: 10 mg via INTRAVENOUS
  Filled 2018-07-17: qty 1

## 2018-07-17 MED ORDER — ACETAMINOPHEN 325 MG PO TABS: 325.0000 mg | ORAL_TABLET | Freq: Four times a day (QID) | ORAL | Status: DC | PRN

## 2018-07-17 MED ORDER — METHOCARBAMOL 500 MG IVPB - SIMPLE MED
INTRAVENOUS | Status: AC
  Administered 2018-07-17: 500 mg via INTRAVENOUS
  Filled 2018-07-17: qty 50

## 2018-07-17 MED ORDER — MIDAZOLAM HCL 2 MG/2ML IJ SOLN
INTRAMUSCULAR | Status: DC | PRN
  Administered 2018-07-17: 2 mg via INTRAVENOUS

## 2018-07-17 MED ORDER — HYDROCODONE-ACETAMINOPHEN 5-325 MG PO TABS
1.0000 | ORAL_TABLET | ORAL | Status: DC | PRN
  Administered 2018-07-17 – 2018-07-18 (×5): 2 via ORAL
  Filled 2018-07-17 (×5): qty 2

## 2018-07-17 MED ORDER — PROPOFOL 500 MG/50ML IV EMUL
INTRAVENOUS | Status: DC | PRN
  Administered 2018-07-17: 50 ug/kg/min via INTRAVENOUS

## 2018-07-17 MED ORDER — METHOCARBAMOL 500 MG IVPB - SIMPLE MED
500.0000 mg | Freq: Four times a day (QID) | INTRAVENOUS | Status: DC | PRN
  Administered 2018-07-17: 500 mg via INTRAVENOUS
  Filled 2018-07-17: qty 50

## 2018-07-17 MED ORDER — CEFAZOLIN SODIUM-DEXTROSE 2-4 GM/100ML-% IV SOLN
2.0000 g | INTRAVENOUS | Status: AC
  Administered 2018-07-17: 2 g via INTRAVENOUS
  Filled 2018-07-17: qty 100

## 2018-07-17 MED ORDER — HYDROCODONE-ACETAMINOPHEN 7.5-325 MG PO TABS: 1.0000 | ORAL_TABLET | ORAL | Status: DC | PRN

## 2018-07-17 MED ORDER — PROPOFOL 10 MG/ML IV BOLUS
INTRAVENOUS | Status: DC | PRN
  Administered 2018-07-17 (×2): 20 mg via INTRAVENOUS

## 2018-07-17 MED ORDER — DOCUSATE SODIUM 100 MG PO CAPS
100.0000 mg | ORAL_CAPSULE | Freq: Two times a day (BID) | ORAL | Status: DC
  Administered 2018-07-17 – 2018-07-18 (×2): 100 mg via ORAL
  Filled 2018-07-17 (×2): qty 1

## 2018-07-17 MED ORDER — ONDANSETRON HCL 4 MG/2ML IJ SOLN
INTRAMUSCULAR | Status: AC
  Filled 2018-07-17: qty 2

## 2018-07-17 MED ORDER — CELECOXIB 200 MG PO CAPS
200.0000 mg | ORAL_CAPSULE | Freq: Two times a day (BID) | ORAL | Status: DC
  Administered 2018-07-17 – 2018-07-18 (×2): 200 mg via ORAL
  Filled 2018-07-17 (×2): qty 1

## 2018-07-17 MED ORDER — ALUM & MAG HYDROXIDE-SIMETH 200-200-20 MG/5ML PO SUSP: 15.0000 mL | ORAL | Status: DC | PRN

## 2018-07-17 MED ORDER — MENTHOL 3 MG MT LOZG: 1.0000 | LOZENGE | OROMUCOSAL | Status: DC | PRN

## 2018-07-17 MED ORDER — SODIUM CHLORIDE 0.9 % IV SOLN
INTRAVENOUS | Status: DC | PRN
  Administered 2018-07-17: 5 ug/min via INTRAVENOUS

## 2018-07-17 MED ORDER — POLYETHYLENE GLYCOL 3350 17 G PO PACK
17.0000 g | PACK | Freq: Two times a day (BID) | ORAL | Status: DC
  Administered 2018-07-18: 17 g via ORAL
  Filled 2018-07-17 (×2): qty 1

## 2018-07-17 MED ORDER — ONDANSETRON HCL 4 MG/2ML IJ SOLN
INTRAMUSCULAR | Status: DC | PRN
  Administered 2018-07-17: 4 mg via INTRAVENOUS

## 2018-07-17 MED ORDER — TRANEXAMIC ACID-NACL 1000-0.7 MG/100ML-% IV SOLN
1000.0000 mg | INTRAVENOUS | Status: AC
  Administered 2018-07-17: 1000 mg via INTRAVENOUS
  Filled 2018-07-17: qty 100

## 2018-07-17 MED ORDER — PHENOL 1.4 % MT LIQD
1.0000 | OROMUCOSAL | Status: DC | PRN
  Filled 2018-07-17: qty 177

## 2018-07-17 MED ORDER — CHLORHEXIDINE GLUCONATE 4 % EX LIQD: 60.0000 mL | Freq: Once | CUTANEOUS | Status: DC

## 2018-07-17 MED ORDER — FERROUS SULFATE 325 (65 FE) MG PO TABS
325.0000 mg | ORAL_TABLET | Freq: Three times a day (TID) | ORAL | Status: DC
  Administered 2018-07-18 (×2): 325 mg via ORAL
  Filled 2018-07-17 (×2): qty 1

## 2018-07-17 MED ORDER — LEVOTHYROXINE SODIUM 88 MCG PO TABS
88.0000 ug | ORAL_TABLET | Freq: Every day | ORAL | Status: DC
  Administered 2018-07-18: 88 ug via ORAL
  Filled 2018-07-17: qty 1

## 2018-07-17 MED ORDER — TRANEXAMIC ACID-NACL 1000-0.7 MG/100ML-% IV SOLN
1000.0000 mg | Freq: Once | INTRAVENOUS | Status: AC
  Administered 2018-07-17: 1000 mg via INTRAVENOUS
  Filled 2018-07-17: qty 100

## 2018-07-17 MED ORDER — HYDROMORPHONE HCL 1 MG/ML IJ SOLN
0.2500 mg | INTRAMUSCULAR | Status: DC | PRN
  Administered 2018-07-17: 0.5 mg via INTRAVENOUS

## 2018-07-17 MED ORDER — PHENYLEPHRINE HCL 10 MG/ML IJ SOLN
INTRAMUSCULAR | Status: DC | PRN
  Administered 2018-07-17: 60 ug via INTRAVENOUS

## 2018-07-17 MED ORDER — MIDAZOLAM HCL 2 MG/2ML IJ SOLN
INTRAMUSCULAR | Status: AC
  Filled 2018-07-17: qty 2

## 2018-07-17 MED ORDER — ASPIRIN 81 MG PO CHEW
81.0000 mg | CHEWABLE_TABLET | Freq: Two times a day (BID) | ORAL | Status: DC
  Administered 2018-07-17 – 2018-07-18 (×2): 81 mg via ORAL
  Filled 2018-07-17 (×2): qty 1

## 2018-07-17 MED ORDER — LIDOCAINE 2% (20 MG/ML) 5 ML SYRINGE
INTRAMUSCULAR | Status: DC | PRN
  Administered 2018-07-17: 40 mg via INTRAVENOUS

## 2018-07-17 MED ORDER — ESCITALOPRAM OXALATE 10 MG PO TABS
10.0000 mg | ORAL_TABLET | Freq: Every day | ORAL | Status: DC
  Administered 2018-07-18: 10 mg via ORAL
  Filled 2018-07-17: qty 1

## 2018-07-17 MED ORDER — DIPHENHYDRAMINE HCL 12.5 MG/5ML PO ELIX: 12.5000 mg | ORAL_SOLUTION | ORAL | Status: DC | PRN

## 2018-07-17 MED ORDER — FENTANYL CITRATE (PF) 100 MCG/2ML IJ SOLN
INTRAMUSCULAR | Status: AC
  Filled 2018-07-17: qty 2

## 2018-07-17 SURGICAL SUPPLY — 42 items
BAG ZIPLOCK 12X15 (MISCELLANEOUS) ×3 IMPLANT
BLADE SAG 18X100X1.27 (BLADE) ×3 IMPLANT
BLADE SURG SZ10 CARB STEEL (BLADE) ×6 IMPLANT
COVER PERINEAL POST (MISCELLANEOUS) ×3 IMPLANT
COVER SURGICAL LIGHT HANDLE (MISCELLANEOUS) ×3 IMPLANT
CUP ACETBLR 52 OD PINNACLE (Hips) ×3 IMPLANT
DERMABOND ADVANCED (GAUZE/BANDAGES/DRESSINGS) ×2
DERMABOND ADVANCED .7 DNX12 (GAUZE/BANDAGES/DRESSINGS) ×1 IMPLANT
DRAPE STERI IOBAN 125X83 (DRAPES) ×3 IMPLANT
DRAPE U-SHAPE 47X51 STRL (DRAPES) ×6 IMPLANT
DRESSING AQUACEL AG SP 3.5X10 (GAUZE/BANDAGES/DRESSINGS) ×1 IMPLANT
DRSG AQUACEL AG SP 3.5X10 (GAUZE/BANDAGES/DRESSINGS) ×3
DURAPREP 26ML APPLICATOR (WOUND CARE) ×3 IMPLANT
ELECT REM PT RETURN 15FT ADLT (MISCELLANEOUS) ×3 IMPLANT
ELIMINATOR HOLE APEX DEPUY (Hips) ×3 IMPLANT
GLOVE BIO SURGEON STRL SZ 6.5 (GLOVE) ×2 IMPLANT
GLOVE BIO SURGEONS STRL SZ 6.5 (GLOVE) ×1
GLOVE BIOGEL PI IND STRL 6.5 (GLOVE) ×1 IMPLANT
GLOVE BIOGEL PI IND STRL 7.5 (GLOVE) ×4 IMPLANT
GLOVE BIOGEL PI IND STRL 8.5 (GLOVE) ×1 IMPLANT
GLOVE BIOGEL PI INDICATOR 6.5 (GLOVE) ×2
GLOVE BIOGEL PI INDICATOR 7.5 (GLOVE) ×8
GLOVE BIOGEL PI INDICATOR 8.5 (GLOVE) ×2
GLOVE ECLIPSE 8.0 STRL XLNG CF (GLOVE) ×6 IMPLANT
GLOVE ORTHO TXT STRL SZ7.5 (GLOVE) ×3 IMPLANT
GLOVE SURG SS PI 7.0 STRL IVOR (GLOVE) ×3 IMPLANT
GOWN STRL REUS W/TWL 2XL LVL3 (GOWN DISPOSABLE) ×3 IMPLANT
GOWN STRL REUS W/TWL LRG LVL3 (GOWN DISPOSABLE) ×9 IMPLANT
HEAD CERAMIC 36 PLUS5 (Hips) ×3 IMPLANT
HOLDER FOLEY CATH W/STRAP (MISCELLANEOUS) ×3 IMPLANT
LINER NEUTRAL 52X36MM PLUS 4 (Liner) ×3 IMPLANT
PACK ANTERIOR HIP CUSTOM (KITS) ×3 IMPLANT
SCREW PINN CAN BONE 6.5MMX15MM (Screw) ×3 IMPLANT
STEM FEM SZ3 STD ACTIS (Stem) ×3 IMPLANT
SUT MNCRL AB 4-0 PS2 18 (SUTURE) ×3 IMPLANT
SUT STRATAFIX 0 PDS 27 VIOLET (SUTURE) ×3
SUT VIC AB 1 CT1 36 (SUTURE) ×9 IMPLANT
SUT VIC AB 2-0 CT1 27 (SUTURE) ×4
SUT VIC AB 2-0 CT1 TAPERPNT 27 (SUTURE) ×2 IMPLANT
SUTURE STRATFX 0 PDS 27 VIOLET (SUTURE) ×1 IMPLANT
TRAY FOLEY MTR SLVR 14FR STAT (SET/KITS/TRAYS/PACK) ×3 IMPLANT
YANKAUER SUCT BULB TIP 10FT TU (MISCELLANEOUS) ×3 IMPLANT

## 2018-07-17 NOTE — Anesthesia Postprocedure Evaluation (Signed)
Anesthesia Post Note  Patient: Debra Jimenez  Procedure(s) Performed: TOTAL HIP ARTHROPLASTY ANTERIOR APPROACH (Right Hip)     Patient location during evaluation: PACU Anesthesia Type: MAC and Spinal Level of consciousness: oriented and awake and alert Pain management: pain level controlled Vital Signs Assessment: post-procedure vital signs reviewed and stable Respiratory status: spontaneous breathing, respiratory function stable and patient connected to nasal cannula oxygen Cardiovascular status: blood pressure returned to baseline and stable Postop Assessment: no headache, no backache, no apparent nausea or vomiting, spinal receding and patient able to bend at knees Anesthetic complications: no    Last Vitals:  Vitals:   07/17/18 1545 07/17/18 1600  BP: 116/68 131/73  Pulse: 79 70  Resp: 17 16  Temp: 36.8 C 36.7 C  SpO2: 100% 99%    Last Pain:  Vitals:   07/17/18 1600  TempSrc: Oral  PainSc: 0-No pain                 Melven Stockard,W. EDMOND

## 2018-07-17 NOTE — Transfer of Care (Signed)
Immediate Anesthesia Transfer of Care Note  Patient: [name]  Procedure(s) Performed: TOTAL HIP ARTHROPLASTY ANTERIOR APPROACH (Right Hip)  Patient Location: PACU  Anesthesia Type:MAC and Spinal  Level of Consciousness: awake, alert , oriented and patient cooperative  Airway & Oxygen Therapy: Patient Spontanous Breathing and Patient connected to face mask oxygen  Post-op Assessment: Report given to RN and Post -op Vital signs reviewed and stable  Post vital signs: Reviewed and stable  Last Vitals:  Vitals Value Taken Time  BP 129/85 07/17/2018  3:04 PM  Temp    Pulse 96 07/17/2018  3:07 PM  Resp 18 07/17/2018  3:07 PM  SpO2 100 % 07/17/2018  3:07 PM  Vitals shown include unvalidated device data.  Last Pain:  Vitals:   07/17/18 1154  TempSrc: Oral  PainSc: 7       Patients Stated Pain Goal: 3 (62/13/08 6578)  Complications: No apparent anesthesia complications

## 2018-07-17 NOTE — Discharge Instructions (Signed)

## 2018-07-17 NOTE — Anesthesia Preprocedure Evaluation (Addendum)
Anesthesia Evaluation  Patient identified by MRN, date of birth, ID band Patient awake    Reviewed: Allergy & Precautions, H&P , NPO status , Patient's Chart, lab work & pertinent test results  Airway Mallampati: II  TM Distance: >3 FB Neck ROM: Full    Dental no notable dental hx. (+) Teeth Intact, Dental Advisory Given   Pulmonary neg pulmonary ROS, former smoker,    Pulmonary exam normal breath sounds clear to auscultation       Cardiovascular negative cardio ROS   Rhythm:Regular Rate:Normal     Neuro/Psych negative neurological ROS  negative psych ROS   GI/Hepatic negative GI ROS, Neg liver ROS,   Endo/Other  Hypothyroidism   Renal/GU negative Renal ROS  negative genitourinary   Musculoskeletal  (+) Arthritis , Osteoarthritis,    Abdominal   Peds  Hematology negative hematology ROS (+)   Anesthesia Other Findings   Reproductive/Obstetrics negative OB ROS                            Anesthesia Physical Anesthesia Plan  ASA: II  Anesthesia Plan: Spinal and MAC   Post-op Pain Management:    Induction: Intravenous  PONV Risk Score and Plan: 3 and Ondansetron, Dexamethasone, Propofol infusion and Midazolam  Airway Management Planned: Simple Face Mask  Additional Equipment:   Intra-op Plan:   Post-operative Plan:   Informed Consent: I have reviewed the patients History and Physical, chart, labs and discussed the procedure including the risks, benefits and alternatives for the proposed anesthesia with the patient or authorized representative who has indicated his/her understanding and acceptance.     Dental advisory given  Plan Discussed with: CRNA  Anesthesia Plan Comments:         Anesthesia Quick Evaluation

## 2018-07-17 NOTE — Op Note (Signed)
NAME:  Debra MinksLinda Arambula                ACCOUNT NO.: [id number]      MEDICAL RECORD NO.: [id number]      FACILITY:  [facility]      PHYSICIAN:  Shelda PalMatthew D [employer] Southers  DATE OF BIRTH:  [birth year]     DATE OF PROCEDURE:  07/17/2018                                 OPERATIVE REPORT         PREOPERATIVE DIAGNOSIS: Right  hip osteoarthritis.      POSTOPERATIVE DIAGNOSIS:  Right hip osteoarthritis.      PROCEDURE:  Right total hip replacement through an anterior approach   utilizing DePuy THR system, component size 52mm pinnacle cup, a size 36+4 neutral   Altrex liner, a size 3 standard Actis stem with a 36+5 delta ceramic   ball.      SURGEON:  Madlyn FrankelMatthew D. Charlann Boxerlin, M.D.      ASSISTANT:  Lanney GinsMatthew Babish, PA-C     ANESTHESIA:  Spinal.      SPECIMENS:  None.      COMPLICATIONS:  None.      BLOOD LOSS:  300 cc     DRAINS:  None.      INDICATION OF THE PROCEDURE:  Debra MinksLinda Jimenez is a 68 y.o. female who had   presented to office for evaluation of right hip pain.  Radiographs revealed   progressive degenerative changes with bone-on-bone   articulation of the  hip joint, including subchondral cystic changes and osteophytes.  The patient had painful limited range of   motion significantly affecting their overall quality of life and function.  The patient was failing to    respond to conservative measures including medications and/or injections and activity modification and at this point was ready   to proceed with more definitive measures.  Consent was obtained for   benefit of pain relief.  Specific risks of infection, DVT, component   failure, dislocation, neurovascular injury, and need for revision surgery were reviewed in the office as well discussion of   the anterior versus posterior approach were reviewed.     PROCEDURE IN DETAIL:  The patient was brought to operative theater.   Once adequate anesthesia, preoperative antibiotics, 2 gm of Ancef, 1 gm of Tranexamic Acid, and  10 mg of Decadron were administered, the patient was positioned supine on the [employer] table.  Once the patient was safely positioned with adequate padding of boney prominences we predraped out the hip, and used fluoroscopy to confirm orientation of the pelvis.      The right hip was then prepped and draped from proximal iliac crest to   mid thigh with a shower curtain technique.      Time-out was performed identifying the patient, planned procedure, and the appropriate extremity.     An incision was then made 2 cm lateral to the   anterior superior iliac spine extending over the orientation of the   tensor fascia lata muscle and sharp dissection was carried down to the   fascia of the muscle.      The fascia was then incised.  The muscle belly was identified and swept   laterally and retractor placed along the superior neck.  Following   cauterization of the circumflex vessels and removing some pericapsular  fat, a second cobra retractor was placed on the inferior neck.  A T-capsulotomy was made along the line of the   superior neck to the trochanteric fossa, then extended proximally and   distally.  Tag sutures were placed and the retractors were then placed   intracapsular.  We then identified the trochanteric fossa and   orientation of my neck cut and then made a neck osteotomy with the femur on traction.  The femoral   head was removed without difficulty or complication.  Traction was let   off and retractors were placed posterior and anterior around the   acetabulum.      The labrum and foveal tissue were debrided.  I began reaming with a 46 mm   reamer and reamed up to 51 mm reamer with good bony bed preparation and a 52 mm  cup was chosen.  The final 52 mm Pinnacle cup was then impacted under fluoroscopy to confirm the depth of penetration and orientation with respect to   Abduction and forward flexion.  A screw was placed into the ilium followed by the hole eliminator.  The  final   36+4 neutral Altrex liner was impacted with good visualized rim fit.  The cup was positioned anatomically within the acetabular portion of the pelvis.      At this point, the femur was rolled to 100 degrees.  Further capsule was   released off the inferior aspect of the femoral neck.  I then   released the superior capsule proximally.  With the leg in a neutral position the hook was placed laterally   along the femur under the vastus lateralis origin and elevated manually and then held in position using the hook attachment on the bed.  The leg was then extended and adducted with the leg rolled to 100   degrees of external rotation.  Retractors were placed along the medial calcar and posteriorly over the greater trochanter.  Once the proximal femur was fully   exposed, I used a box osteotome to set orientation.  I then began   broaching with the starting chili pepper broach and passed this by hand and then broached up to 3.  With the 3 broach in place I chose a standard offset neck and did several trial reductions.  The offset was appropriate, leg lengths   appeared to be equal best matched with the +5 head ball trial confirmed radiographically.   Given these findings, I went ahead and dislocated the hip, repositioned all   retractors and positioned the right hip in the extended and abducted position.  The final 3 standard Actis femoral stem was   chosen and it was impacted down to the level of neck cut.  Based on this   and the trial reductions, a final 36+5 delta ceramic ball was chosen and   impacted onto a clean and dry trunnion, and the hip was reduced.  The   hip had been irrigated throughout the case again at this point.  I did   reapproximate the superior capsular leaflet to the anterior leaflet   using #1 Vicryl.  The fascia of the   tensor fascia lata muscle was then reapproximated using #1 Vicryl and #0 Stratafix sutures.  The   remaining wound was closed with 2-0 Vicryl and  running 4-0 Monocryl.   The hip was cleaned, dried, and dressed sterilely using Dermabond and   Aquacel dressing.  The patient was then brought   to recovery room  in stable condition tolerating the procedure well.    Lanney Gins, PA-C was present for the entirety of the case involved from   preoperative positioning, perioperative retractor management, general   facilitation of the case, as well as primary wound closure as assistant.            Madlyn Frankel Charlann Boxer, M.D.        07/17/2018 1:50 PM

## 2018-07-17 NOTE — Anesthesia Procedure Notes (Signed)
Spinal  Patient location during procedure: OR Start time: 07/17/2018 1:34 PM End time: 07/17/2018 1:39 PM Staffing Anesthesiologist: Gaynelle Adu, MD Resident/CRNA: Wilder Glade, CRNA Performed: resident/CRNA  Preanesthetic Checklist Completed: patient identified, site marked, surgical consent, pre-op evaluation, timeout performed, IV checked, risks and benefits discussed and monitors and equipment checked Spinal Block Patient position: sitting Prep: ChloraPrep, site prepped and draped, DuraPrep and alcohol swabs Patient monitoring: heart rate, cardiac monitor, continuous pulse ox and blood pressure Approach: midline Location: L4-5 Injection technique: single-shot Needle Needle type: Pencan  Needle gauge: 24 G Needle length: 10 cm Needle insertion depth: 7.5 cm Assessment Sensory level: T10 Additional Notes Risks benefits alternatives and plan discussed with patient and MD CRNA all questions answered patient verbalizes desire to proceed as planned, to OR in stretcher positioned on stretcher for SAB MD Sampson Goon present throughout the entire block, sterile procedure prep drape and glove with FM worn throughout, choraprep applied and allowed to dry for 3 minutes, lidocaine 1% MPF 4 mL, L4-5 pencan 24G via introducer one attempt brief, successful positive CSF placed 1.70mL or 12.5 mg of 0.75% spinal bupivicaine easily barbotage at beginning and end negative paresthesia, block finished and immediately positioned on back and patient verbalizes legs getting "tingly and warm" and decreased motor, sedation initiated and patient transferred to OR bed easily with head maintained midline neutral throughout.

## 2018-07-17 NOTE — Interval H&P Note (Signed)
History and Physical Interval Note:  07/17/2018 12:34 PM  Debra MinksLinda Boffa  has presented today for surgery, with the diagnosis of Right hip osteoarthritis  The various methods of treatment have been discussed with the patient and family. After consideration of risks, benefits and other options for treatment, the patient has consented to  Procedure(s) with comments: TOTAL HIP ARTHROPLASTY ANTERIOR APPROACH (Right) - 70 mins as a surgical intervention .  The patient's history has been reviewed, patient examined, no change in status, stable for surgery.  I have reviewed the patient's chart and labs.  Questions were answered to the patient's satisfaction.     Shelda PalMatthew D [place] Pelto

## 2018-07-18 ENCOUNTER — Other Ambulatory Visit (HOSPITAL_COMMUNITY): Payer: Self-pay | Admitting: Orthopedic Surgery

## 2018-07-18 ENCOUNTER — Encounter (HOSPITAL_COMMUNITY): Payer: Self-pay | Admitting: Orthopedic Surgery

## 2018-07-18 DIAGNOSIS — M1611 Unilateral primary osteoarthritis, right hip: Secondary | ICD-10-CM | POA: Diagnosis not present

## 2018-07-18 DIAGNOSIS — Z79899 Other long term (current) drug therapy: Secondary | ICD-10-CM | POA: Diagnosis not present

## 2018-07-18 DIAGNOSIS — E039 Hypothyroidism, unspecified: Secondary | ICD-10-CM | POA: Diagnosis not present

## 2018-07-18 DIAGNOSIS — Z79890 Hormone replacement therapy (postmenopausal): Secondary | ICD-10-CM | POA: Diagnosis not present

## 2018-07-18 DIAGNOSIS — Z87891 Personal history of nicotine dependence: Secondary | ICD-10-CM | POA: Diagnosis not present

## 2018-07-18 LAB — CBC
HCT: 34.1 % — ABNORMAL LOW (ref 36.0–46.0)
Hemoglobin: 10.6 g/dL — ABNORMAL LOW (ref 12.0–15.0)
MCH: 29.4 pg (ref 26.0–34.0)
MCHC: 31.1 g/dL (ref 30.0–36.0)
MCV: 94.7 fL (ref 80.0–100.0)
Platelets: 226 10*3/uL (ref 150–400)
RBC: 3.6 MIL/uL — ABNORMAL LOW (ref 3.87–5.11)
RDW: 12 % (ref 11.5–15.5)
WBC: 10.6 10*3/uL — ABNORMAL HIGH (ref 4.0–10.5)
nRBC: 0 % (ref 0.0–0.2)

## 2018-07-18 LAB — BASIC METABOLIC PANEL
Anion gap: 7 (ref 5–15)
BUN: 12 mg/dL (ref 8–23)
CO2: 25 mmol/L (ref 22–32)
Calcium: 9.3 mg/dL (ref 8.9–10.3)
Chloride: 104 mmol/L (ref 98–111)
Creatinine, Ser: 0.64 mg/dL (ref 0.44–1.00)
GFR calc Af Amer: >60 mL/min (ref >60)
GFR calc non Af Amer: >60 mL/min (ref >60)
Glucose, Bld: 178 mg/dL — ABNORMAL HIGH (ref 70–99)
POTASSIUM: 4 mmol/L (ref 3.5–5.1)
Sodium: 136 mmol/L (ref 135–145)

## 2018-07-18 MED ORDER — METHOCARBAMOL 500 MG PO TABS: 500.0000 mg | ORAL_TABLET | Freq: Four times a day (QID) | ORAL | 0 refills | Status: DC | PRN

## 2018-07-18 MED ORDER — ASPIRIN 81 MG PO CHEW: 81.0000 mg | CHEWABLE_TABLET | Freq: Two times a day (BID) | ORAL | 0 refills | Status: AC

## 2018-07-18 MED ORDER — HYDROCODONE-ACETAMINOPHEN 7.5-325 MG PO TABS: 1.0000 | ORAL_TABLET | ORAL | 0 refills | Status: DC | PRN

## 2018-07-18 MED ORDER — FERROUS SULFATE 325 (65 FE) MG PO TABS: 325.0000 mg | ORAL_TABLET | Freq: Three times a day (TID) | ORAL | 3 refills | Status: DC

## 2018-07-18 MED ORDER — POLYETHYLENE GLYCOL 3350 17 G PO PACK: 17.0000 g | PACK | Freq: Two times a day (BID) | ORAL | 0 refills | Status: DC

## 2018-07-18 MED ORDER — DOCUSATE SODIUM 100 MG PO CAPS: 100.0000 mg | ORAL_CAPSULE | Freq: Two times a day (BID) | ORAL | 0 refills | Status: DC

## 2018-07-18 NOTE — Progress Notes (Signed)
     Subjective: 1 Day Post-Op Procedure(s) (LRB): TOTAL HIP ARTHROPLASTY ANTERIOR APPROACH (Right)   Patient reports pain as mild, pain controlled. No events throughout the night.  Feels that she is doing well and ready to improve.  Ready to be discharged home.    Objective:   VITALS:   Vitals:   07/18/18 0120 07/18/18 0458  BP: 127/81 129/67  Pulse: 80 76  Resp: 20 16  Temp: 97.8 F (36.6 C) 97.9 F (36.6 C)  SpO2: 100% 96%    Dorsiflexion/Plantar flexion intact Incision: dressing C/D/I No cellulitis present Compartment soft  LABS Recent Labs    07/18/18 0552  HGB 10.6*  HCT 34.1*  WBC 10.6*  PLT 226    Recent Labs    07/18/18 0552  NA 136  K 4.0  BUN 12  CREATININE 0.64  GLUCOSE 178*     Assessment/Plan: 1 Day Post-Op Procedure(s) (LRB): TOTAL HIP ARTHROPLASTY ANTERIOR APPROACH (Right) Foley cath d/c'ed Advance diet Up with therapy D/C IV fluids Discharge home Follow up in 2 weeks at [facility]). Follow up with OLIN,Nyron Mozer D in 2 weeks.  Contact information:  [facility]) [address] [place] [address] [phone]         Anastasio Auerbach. Artha Chiasson   PAC  07/18/2018, 8:31 AM

## 2018-07-18 NOTE — Progress Notes (Signed)
Physical Therapy Treatment Patient Details Name: Debra Jimenez MRN: [id number] DOB: 1951-01-20 Today's Date: 07/18/2018    History of Present Illness Pt s/p R THR    PT Comments    Pt progressing well with mobility and eager for dc home.  Spouse present to review stairs, shower transfers, lower body dressing, car transfers and home therex program with progression and written instruction provided.   Follow Up Recommendations  Follow surgeon's recommendation for DC plan and follow-up therapies     Equipment Recommendations  None recommended by PT    Recommendations for Other Services       Precautions / Restrictions Precautions Precautions: Fall Restrictions Weight Bearing Restrictions: No RLE Weight Bearing: Weight bearing as tolerated    Mobility  Bed Mobility               General bed mobility comments: Pt up in chair and requests back to same.  Pt reports min difficulty with OOB transfers this am  Transfers Overall transfer level: Needs assistance Equipment used: Rolling walker (2 wheeled) Transfers: Sit to/from Stand Sit to Stand: Min guard;Supervision         General transfer comment: cues for LE management and use of UEs to self assist  Ambulation/Gait Ambulation/Gait assistance: Min guard;Supervision Gait Distance (Feet): 100 Feet Assistive device: Rolling walker (2 wheeled) Gait Pattern/deviations: Step-to pattern;Step-through pattern;Shuffle;Trunk flexed Gait velocity: decr   General Gait Details: cues for posture, position from RW and initial sequence   Stairs Stairs: Yes Stairs assistance: Min assist Stair Management: No rails;Step to pattern;Forwards;With walker Number of Stairs: 2 General stair comments: cues for sequence and foot/RW placement.  RW at top of stairs and pt utilizing RW and door frame to ascend   Wheelchair Mobility    Modified Rankin (Stroke Patients Only)       Balance Overall balance assessment: Mild deficits  observed, not formally tested                                          Cognition Arousal/Alertness: Awake/alert Behavior During Therapy: WFL for tasks assessed/performed Overall Cognitive Status: Within Functional Limits for tasks assessed                                        Exercises Total Joint Exercises Ankle Circles/Pumps: AROM;Both;20 reps;Supine Quad Sets: AROM;Both;10 reps;Supine Heel Slides: AAROM;Right;20 reps;Supine Hip ABduction/ADduction: AAROM;Right;15 reps;Supine Long Arc Quad: AROM;Right;10 reps;Supine    General Comments        Pertinent Vitals/Pain Pain Assessment: 0-10 Pain Score: 4  Pain Location: R hip Pain Descriptors / Indicators: Aching;Sore Pain Intervention(s): Limited activity within patient's tolerance;Monitored during session;Premedicated before session;Ice applied    Home Living Family/patient expects to be discharged to:: Private residence Living Arrangements: Spouse/significant other Available Help at Discharge: Family Type of Home: House Home Access: Stairs to enter Entrance Stairs-Rails: None Home Layout: One level Home Equipment: Environmental consultant - 2 wheels;Bedside commode      Prior Function Level of Independence: Independent          PT Goals (current goals can now be found in the care plan section) Acute Rehab PT Goals Patient Stated Goal: Regain IND PT Goal Formulation: With patient Time For Goal Achievement: 07/25/18 Potential to Achieve Goals: Good Progress towards PT goals: Progressing toward goals  Frequency    7X/week      PT Plan Current plan remains appropriate    Co-evaluation              AM-PAC PT "6 Clicks" Mobility   Outcome Measure  Help needed turning from your back to your side while in a flat bed without using bedrails?: A Little Help needed moving from lying on your back to sitting on the side of a flat bed without using bedrails?: A Little Help needed moving  to and from a bed to a chair (including a wheelchair)?: A Little Help needed standing up from a chair using your arms (e.g., wheelchair or bedside chair)?: A Little Help needed to walk in hospital room?: A Little Help needed climbing 3-5 steps with a railing? : A Little 6 Click Score: 18    End of Session Equipment Utilized During Treatment: Gait belt Activity Tolerance: Patient tolerated treatment well Patient left: in chair;with call bell/phone within reach;with family/visitor present Nurse Communication: Mobility status PT Visit Diagnosis: Difficulty in walking, not elsewhere classified (R26.2)     Time: 7867-6720 PT Time Calculation (min) (ACUTE ONLY): 38 min  Charges:  $Gait Training: 8-22 mins $Therapeutic Exercise: 8-22 mins $Therapeutic Activity: 8-22 mins                     Mauro Kaufmann PT Acute Rehabilitation Services Pager [phone] Office [phone]    Debra Jimenez 07/18/2018, 1:03 PM

## 2018-07-18 NOTE — Evaluation (Signed)
Physical Therapy Evaluation Patient Details Name: Debra Jimenez MRN: [id number] DOB: [birth year] Today's Date: 07/18/2018   History of Present Illness  Pt s/p R THR  Clinical Impression  Pt s/p R THR and presents with decreased R LE strength/ROM and post op pain limiting functional mobility.  Pt should progress to dc home with family assist.    Follow Up Recommendations Follow surgeon's recommendation for DC plan and follow-up therapies    Equipment Recommendations  None recommended by PT    Recommendations for Other Services       Precautions / Restrictions Precautions Precautions: Fall Restrictions Weight Bearing Restrictions: No RLE Weight Bearing: Weight bearing as tolerated      Mobility  Bed Mobility               General bed mobility comments: Pt up in chair and requests back to same.  Pt reports min difficulty with OOB transfers this am  Transfers Overall transfer level: Needs assistance Equipment used: Rolling walker (2 wheeled) Transfers: Sit to/from Stand Sit to Stand: Min assist;Min guard         General transfer comment: cues for LE management and use of UEs to self assist  Ambulation/Gait Ambulation/Gait assistance: Min assist;Min guard Gait Distance (Feet): 111 Feet Assistive device: Rolling walker (2 wheeled) Gait Pattern/deviations: Step-to pattern;Step-through pattern;Shuffle;Trunk flexed Gait velocity: decr   General Gait Details: cues for posture, position from RW and initial sequence  Stairs            Wheelchair Mobility    Modified Rankin (Stroke Patients Only)       Balance Overall balance assessment: Mild deficits observed, not formally tested                                           Pertinent Vitals/Pain Pain Assessment: 0-10 Pain Score: 5  Pain Location: R hip Pain Descriptors / Indicators: Aching;Sore Pain Intervention(s): Limited activity within patient's tolerance;Monitored during  session;Premedicated before session;Ice applied    Home Living Family/patient expects to be discharged to:: Private residence Living Arrangements: Spouse/significant other Available Help at Discharge: Family Type of Home: House Home Access: Stairs to enter Entrance Stairs-Rails: None Entrance Stairs-Number of Steps: 2 Home Layout: One level Home Equipment: Environmental consultant - 2 wheels;Bedside commode      Prior Function Level of Independence: Independent               Hand Dominance        Extremity/Trunk Assessment   Upper Extremity Assessment Upper Extremity Assessment: Overall WFL for tasks assessed    Lower Extremity Assessment Lower Extremity Assessment: RLE deficits/detail RLE Deficits / Details: 2+/5 strength at hip with AAROM at hip to 85 flex and 20 abd    Cervical / Trunk Assessment Cervical / Trunk Assessment: Normal  Communication   Communication: No difficulties  Cognition Arousal/Alertness: Awake/alert Behavior During Therapy: WFL for tasks assessed/performed Overall Cognitive Status: Within Functional Limits for tasks assessed                                        General Comments      Exercises Total Joint Exercises Ankle Circles/Pumps: AROM;Both;20 reps;Supine Quad Sets: AROM;Both;10 reps;Supine Heel Slides: AAROM;Right;20 reps;Supine Hip ABduction/ADduction: AAROM;Right;15 reps;Supine Long Arc Quad: AROM;Right;10 reps;Supine   Assessment/Plan  PT Assessment Patient needs continued PT services  PT Problem List Decreased strength;Decreased range of motion;Decreased activity tolerance;Decreased mobility;Decreased knowledge of use of DME;Pain       PT Treatment Interventions DME instruction;Gait training;Stair training;Functional mobility training;Therapeutic activities;Therapeutic exercise;Patient/family education    PT Goals (Current goals can be found in the Care Plan section)  Acute Rehab PT Goals Patient Stated Goal:  Regain IND PT Goal Formulation: With patient Time For Goal Achievement: 07/25/18 Potential to Achieve Goals: Good    Frequency 7X/week   Barriers to discharge        Co-evaluation               AM-PAC PT "6 Clicks" Mobility  Outcome Measure Help needed turning from your back to your side while in a flat bed without using bedrails?: A Little Help needed moving from lying on your back to sitting on the side of a flat bed without using bedrails?: A Little Help needed moving to and from a bed to a chair (including a wheelchair)?: A Little Help needed standing up from a chair using your arms (e.g., wheelchair or bedside chair)?: A Little Help needed to walk in hospital room?: A Little Help needed climbing 3-5 steps with a railing? : A Little 6 Click Score: 18    End of Session Equipment Utilized During Treatment: Gait belt Activity Tolerance: Patient tolerated treatment well Patient left: in chair;with call bell/phone within reach;with family/visitor present Nurse Communication: Mobility status PT Visit Diagnosis: Difficulty in walking, not elsewhere classified (R26.2)    Time: 6384-6659 PT Time Calculation (min) (ACUTE ONLY): 30 min   Charges:   PT Evaluation $PT Eval Low Complexity: 1 Low PT Treatments $Therapeutic Exercise: 8-22 mins        Mauro Kaufmann PT Acute Rehabilitation Services Pager [phone] Office [phone]   Debra Jimenez 07/18/2018, 12:56 PM

## 2018-07-19 NOTE — Discharge Summary (Signed)
Physician Discharge Summary  Patient ID: Debra Jimenez MRN: [id number] DOB/AGE: 1951-03-09 68 y.o.  Admit date: 07/17/2018 Discharge date: 07/18/2018   Procedures:  Procedure(s) (LRB): TOTAL HIP ARTHROPLASTY ANTERIOR APPROACH (Right)  Attending Physician:  Dr. Durene RomansMatthew Olin   Admission Diagnoses:   Right hip primary OA / pain  Discharge Diagnoses:  Principal Problem:   S/P right THA, AA  Past Medical History:  Diagnosis Date  . Hypothyroidism   . OA (osteoarthritis)    RIGHT HIP AND LEFT HIP  . Wears glasses     HPI:     Debra MinksLinda Jimenez, 68 y.o. female, has a history of pain and functional disability in the right hip(s) due to arthritis and patient has failed non-surgical conservative treatments for greater than 12 weeks to include NSAID's and/or analgesics and activity modification.  Onset of symptoms was gradual starting 1 years ago with gradually worsening course since that time.The patient noted no past surgery on the right hip(s).  Patient currently rates pain in the right hip at 10 out of 10 with activity. Patient has worsening of pain with activity and weight bearing and pain that interfers with activities of daily living.  BILATERAL HIP X-RAY images were ordered, obtained, and interpreted on 05/28/2018 by Dr. Charlann Boxerlin. Images demonstrate no fracture, no dislocation and advanced degenerative changes with complete loss of joint space in the right hip with periarticular osteophytes and subchondral cystic changes. Her left hip joint space is relatively better preserved consistent with her examination.  This condition presents safety issues increasing the risk of falls.  There is no current active infection.  PCP: Royann ShiversSkillman, Katherine E, PA-C   Discharged Condition: good  Hospital Course:  Patient underwent the above stated procedure on 07/17/2018. Patient tolerated the procedure well and brought to the recovery room in good condition and subsequently to the floor.  POD #1 BP: 129/67  ; Pulse: 76 ; Temp: 97.9 F (36.6 C) ; Resp: 16 Patient reports pain as mild, pain controlled. No events throughout the night.  Feels that she is doing well and ready to improve.  Ready to be discharged home.   LABS  Basename    HGB     10.6  HCT     34.1    Discharge Exam: General appearance: alert, cooperative and no distress Extremities: Homans sign is negative, no sign of DVT, no edema, redness or tenderness in the calves or thighs and no ulcers, gangrene or trophic changes  Disposition:  Home with follow up in 2 weeks   Follow-up Information    Durene Romanslin, Teyton Pattillo, MD. Schedule an appointment as soon as possible for a visit in 2 week(s).   Specialty:  Orthopedic Surgery Contact information: [address] [place] [id number] [phone]           Discharge Instructions    Call MD / Call 911   Complete by:  As directed    If you experience chest pain or shortness of breath, CALL 911 and be transported to the hospital emergency room.  If you develope a fever above 101 F, pus (white drainage) or increased drainage or redness at the wound, or calf pain, call your surgeon's office.   Change dressing   Complete by:  As directed    Maintain surgical dressing until follow up in the clinic. If the edges start to pull up, may reinforce with tape. If the dressing is no longer working, may remove and cover with gauze and tape, but must  keep the area dry and clean.  Call with any questions or concerns.   Constipation Prevention   Complete by:  As directed    Drink plenty of fluids.  Prune juice may be helpful.  You may use a stool softener, such as Colace (over the counter) 100 mg twice a day.  Use MiraLax (over the counter) for constipation as needed.   Diet - low sodium heart healthy   Complete by:  As directed    Discharge instructions   Complete by:  As directed    Maintain surgical dressing until follow up in the clinic. If the edges start to pull up, may reinforce  with tape. If the dressing is no longer working, may remove and cover with gauze and tape, but must keep the area dry and clean.  Follow up in 2 weeks at [facility]. Call with any questions or concerns.   Increase activity slowly as tolerated   Complete by:  As directed    Weight bearing as tolerated with assist device (walker, cane, etc) as directed, use it as long as suggested by your surgeon or therapist, typically at least 4-6 weeks.   TED hose   Complete by:  As directed    Use stockings (TED hose) for 2 weeks on both leg(s).  You may remove them at night for sleeping.      Allergies as of 07/18/2018   No Known Allergies     Medication List    STOP taking these medications   ibuprofen 800 MG tablet Commonly known as:  ADVIL,MOTRIN     TAKE these medications   aspirin 81 MG chewable tablet Commonly known as:  ASPIRIN CHILDRENS Chew 1 tablet (81 mg total) by mouth 2 (two) times daily for 30 days. Take for 4 weeks, then resume regular dose.   docusate sodium 100 MG capsule Commonly known as:  COLACE Take 1 capsule (100 mg total) by mouth 2 (two) times daily.   escitalopram 10 MG tablet Commonly known as:  LEXAPRO Take 10 mg by mouth daily.   ferrous sulfate 325 (65 FE) MG tablet Commonly known as:  FERROUSUL Take 1 tablet (325 mg total) by mouth 3 (three) times daily with meals.   HYDROcodone-acetaminophen 7.5-325 MG tablet Commonly known as:  NORCO Take 1-2 tablets by mouth every 4 (four) hours as needed for moderate pain.   levothyroxine 88 MCG tablet Commonly known as:  SYNTHROID, LEVOTHROID Take 88 mcg by mouth daily before breakfast.   methocarbamol 500 MG tablet Commonly known as:  ROBAXIN Take 1 tablet (500 mg total) by mouth every 6 (six) hours as needed for muscle spasms.   polyethylene glycol packet Commonly known as:  MIRALAX / GLYCOLAX Take 17 g by mouth 2 (two) times daily.   zolpidem 10 MG tablet Commonly known as:  AMBIEN Take 10 mg  by mouth at bedtime.            Discharge Care Instructions  (From admission, onward)         Start     Ordered   07/18/18 0000  Change dressing    Comments:  Maintain surgical dressing until follow up in the clinic. If the edges start to pull up, may reinforce with tape. If the dressing is no longer working, may remove and cover with gauze and tape, but must keep the area dry and clean.  Call with any questions or concerns.   07/18/18 3710  Signed: Anastasio Auerbach. Teller Wakefield   PA-C  07/19/2018, 8:16 AM

## 2018-08-30 DIAGNOSIS — Z471 Aftercare following joint replacement surgery: Secondary | ICD-10-CM | POA: Diagnosis not present

## 2018-08-30 DIAGNOSIS — Z96641 Presence of right artificial hip joint: Secondary | ICD-10-CM | POA: Diagnosis not present

## 2019-01-21 ENCOUNTER — Other Ambulatory Visit: Payer: Self-pay

## 2019-02-07 DIAGNOSIS — Z6823 Body mass index (BMI) 23.0-23.9, adult: Secondary | ICD-10-CM | POA: Diagnosis not present

## 2019-02-07 DIAGNOSIS — F329 Major depressive disorder, single episode, unspecified: Secondary | ICD-10-CM | POA: Diagnosis not present

## 2019-02-07 DIAGNOSIS — Z1389 Encounter for screening for other disorder: Secondary | ICD-10-CM | POA: Diagnosis not present

## 2019-02-07 DIAGNOSIS — E039 Hypothyroidism, unspecified: Secondary | ICD-10-CM | POA: Diagnosis not present

## 2019-02-07 DIAGNOSIS — G4700 Insomnia, unspecified: Secondary | ICD-10-CM | POA: Diagnosis not present

## 2019-02-07 DIAGNOSIS — Z1331 Encounter for screening for depression: Secondary | ICD-10-CM | POA: Diagnosis not present

## 2019-02-07 DIAGNOSIS — L819 Disorder of pigmentation, unspecified: Secondary | ICD-10-CM | POA: Diagnosis not present

## 2019-03-05 DIAGNOSIS — Z23 Encounter for immunization: Secondary | ICD-10-CM | POA: Diagnosis not present

## 2019-04-10 DIAGNOSIS — L232 Allergic contact dermatitis due to cosmetics: Secondary | ICD-10-CM | POA: Diagnosis not present

## 2019-04-10 DIAGNOSIS — R21 Rash and other nonspecific skin eruption: Secondary | ICD-10-CM | POA: Diagnosis not present

## 2019-07-10 IMAGING — DX DG PORTABLE PELVIS
1 series · 1 of 1 positions shown · non-contrast
Comparison: Intraoperative C-arm views.

CLINICAL DATA: 67-year-old female post right hip replacement.
Initial encounter.

EXAM:
PORTABLE PELVIS 1-2 VIEWS

[pelvis ap]
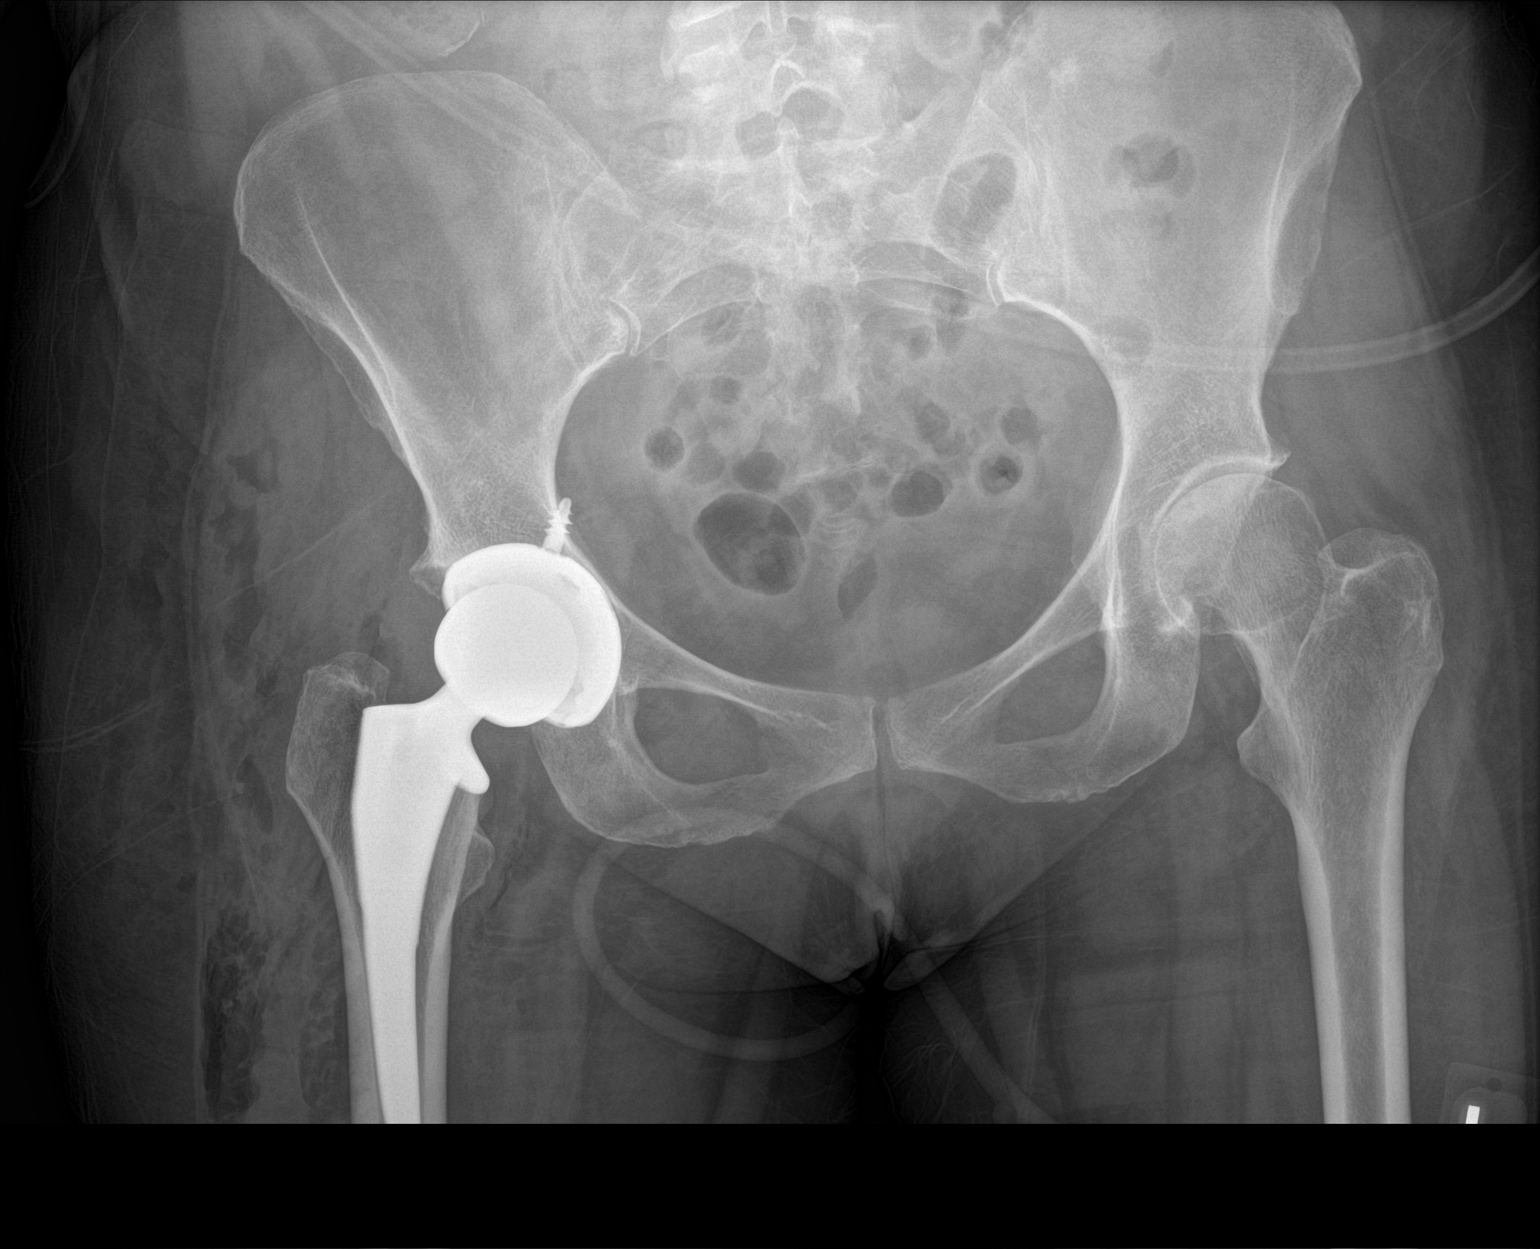

[1 of 1 positions shown; findings below may reference images not displayed]

FINDINGS: Post total right hip replacement. The acetabular screw breaches the
cortex entering the pelvis. Otherwise right hip replacement without
complication noted on frontal imaging.

Left hip joint degenerative changes.
IMPRESSION: 1. Post total right hip replacement. The acetabular screw breaches
the cortex entering the pelvis.
2. Left hip joint degenerative changes.

These results will be called to the ordering clinician or
representative by the Radiologist Assistant, and communication
documented in the PACS or zVision Dashboard.

## 2019-07-11 DIAGNOSIS — Z23 Encounter for immunization: Secondary | ICD-10-CM | POA: Diagnosis not present

## 2019-07-29 DIAGNOSIS — H16223 Keratoconjunctivitis sicca, not specified as Sjogren's, bilateral: Secondary | ICD-10-CM | POA: Diagnosis not present

## 2019-07-29 DIAGNOSIS — H524 Presbyopia: Secondary | ICD-10-CM | POA: Diagnosis not present

## 2019-07-29 DIAGNOSIS — H04123 Dry eye syndrome of bilateral lacrimal glands: Secondary | ICD-10-CM | POA: Diagnosis not present

## 2019-07-29 DIAGNOSIS — H43393 Other vitreous opacities, bilateral: Secondary | ICD-10-CM | POA: Diagnosis not present

## 2019-08-12 ENCOUNTER — Ambulatory Visit: Payer: Medicare Other

## 2019-08-12 ENCOUNTER — Ambulatory Visit: Payer: Medicare Other | Admitting: Pulmonary Disease

## 2019-08-16 DIAGNOSIS — Z23 Encounter for immunization: Secondary | ICD-10-CM | POA: Diagnosis not present

## 2019-08-29 DIAGNOSIS — F329 Major depressive disorder, single episode, unspecified: Secondary | ICD-10-CM | POA: Diagnosis not present

## 2019-08-29 DIAGNOSIS — M25552 Pain in left hip: Secondary | ICD-10-CM | POA: Diagnosis not present

## 2019-08-29 DIAGNOSIS — R079 Chest pain, unspecified: Secondary | ICD-10-CM | POA: Diagnosis not present

## 2019-09-10 DIAGNOSIS — R079 Chest pain, unspecified: Secondary | ICD-10-CM | POA: Diagnosis not present

## 2019-09-10 DIAGNOSIS — R0602 Shortness of breath: Secondary | ICD-10-CM | POA: Diagnosis not present

## 2019-09-16 DIAGNOSIS — Z6824 Body mass index (BMI) 24.0-24.9, adult: Secondary | ICD-10-CM | POA: Diagnosis not present

## 2019-09-16 DIAGNOSIS — R030 Elevated blood-pressure reading, without diagnosis of hypertension: Secondary | ICD-10-CM | POA: Diagnosis not present

## 2019-09-16 DIAGNOSIS — F329 Major depressive disorder, single episode, unspecified: Secondary | ICD-10-CM | POA: Diagnosis not present

## 2019-09-16 DIAGNOSIS — R942 Abnormal results of pulmonary function studies: Secondary | ICD-10-CM | POA: Diagnosis not present

## 2019-09-16 DIAGNOSIS — E039 Hypothyroidism, unspecified: Secondary | ICD-10-CM | POA: Diagnosis not present

## 2019-09-19 DIAGNOSIS — F419 Anxiety disorder, unspecified: Secondary | ICD-10-CM | POA: Diagnosis not present

## 2019-09-19 DIAGNOSIS — I10 Essential (primary) hypertension: Secondary | ICD-10-CM | POA: Diagnosis not present

## 2019-10-07 DIAGNOSIS — I10 Essential (primary) hypertension: Secondary | ICD-10-CM | POA: Diagnosis not present

## 2019-10-07 DIAGNOSIS — F329 Major depressive disorder, single episode, unspecified: Secondary | ICD-10-CM | POA: Diagnosis not present

## 2019-10-07 DIAGNOSIS — Z6823 Body mass index (BMI) 23.0-23.9, adult: Secondary | ICD-10-CM | POA: Diagnosis not present

## 2019-10-07 DIAGNOSIS — E039 Hypothyroidism, unspecified: Secondary | ICD-10-CM | POA: Diagnosis not present

## 2019-10-17 ENCOUNTER — Encounter: Payer: Self-pay | Admitting: Pulmonary Disease

## 2019-10-17 ENCOUNTER — Telehealth: Payer: Self-pay | Admitting: Pulmonary Disease

## 2019-10-17 ENCOUNTER — Ambulatory Visit (INDEPENDENT_AMBULATORY_CARE_PROVIDER_SITE_OTHER): Payer: Medicare Other | Admitting: Pulmonary Disease

## 2019-10-17 ENCOUNTER — Other Ambulatory Visit: Payer: Self-pay

## 2019-10-17 VITALS — BP 130/82 | HR 75 | Temp 97.0°F | Ht 65.5 in | Wt 142.0 lb

## 2019-10-17 DIAGNOSIS — J849 Interstitial pulmonary disease, unspecified: Secondary | ICD-10-CM | POA: Insufficient documentation

## 2019-10-17 NOTE — Telephone Encounter (Signed)
[email or web address] 11/01/19 Tobe Sos

## 2019-10-17 NOTE — Patient Instructions (Signed)
  You have "interstitial changes" on x-ray which may indicate scar tissue buildup in the lungs  Schedule high-resolution CT chest without contrast Ambulatory saturation. Obtain PFT report from [facility]

## 2019-10-17 NOTE — Progress Notes (Signed)
Subjective:    Patient ID: Debra Jimenez, female    DOB: 1950/10/20, 69 y.o.   MRN: [id number]  HPI  Chief Complaint  Patient presents with  . Consult    Patient has shortness of breath with exertion. When sitting and resting no SOB but sometimes feels like she needs to take a deep breath. Patient also has high BP that is being worked on by PCP was started on BP meds a week ago. Denies cough   69 year old remote smoker presents for evaluation of dyspnea on exertion.  She reports an episode of severe "bronchitis" in 07/2017 and again in 10/2017 and reports increased dyspnea since.  She can work in the yard for 20 minutes and then she is huffing and puffing, she cannot climb stairs without stopping.  Dyspnea is relieved by sitting down or relaxing  she was evaluated by Dr. Luan Pulling ,  and told that she has restrictive lung disease, PFTs were performed at [facility] [place]-not available to me at the time of dictation  She has been running high blood pressure for the past few weeks and was restarted on lisinopril 2 weeks ago, her blood pressure is good today and for the last 2 days she states that she has not had headaches.  Breathing may be slightly improved. She was also started on anxiety medication-Xanax but has tried not to use this.  She denies cough.  She smoked half pack per day starting at age 13 until she quit in 2004, but 16 pack years She lives with her husband [name] and has always lived in [place], has always had a dog.  No history of history of any other environmental exposure  I have reviewed PCP notes Chest x-ray 3/29 is reported as bilateral interstitial changes of a chronic nature , films not available for me to review    Significant tests/ events reviewed  PFTs -[facility] Chest x-ray 3/29-bilateral interstitial changes  On ambulation, heart rate increased from 90-1 08 and oxygen saturation dropped from 97% to 91%   Past Medical History:  Diagnosis Date   . Hypothyroidism   . OA (osteoarthritis)    RIGHT HIP AND LEFT HIP  . Wears glasses    Past Surgical History:  Procedure Laterality Date  . BLADDER TACK  2014  APPROX.   NO SLING  . TOTAL HIP ARTHROPLASTY Right 07/17/2018   Procedure: TOTAL HIP ARTHROPLASTY ANTERIOR APPROACH;  Surgeon: Paralee Cancel, MD;  Location: [facility];  Service: Orthopedics;  Laterality: Right;  70 mins  . VAGINAL HYSTERECTOMY  2003   w/  BSO    No Known Allergies  Social History   Socioeconomic History  . Marital status: Married    Spouse name: Not on file  . Number of children: Not on file  . Years of education: Not on file  . Highest education level: Not on file  Occupational History  . Not on file  Tobacco Use  . Smoking status: Former Smoker    Years: 15.00    Quit date: 07/11/2002    Years since quitting: 17.2  . Smokeless tobacco: Never Used  Substance and Sexual Activity  . Alcohol use: Not Currently  . Drug use: Never  . Sexual activity: Not on file  Other Topics Concern  . Not on file  Social History Narrative  . Not on file   Social Determinants of Health   Financial Resource Strain:   . Difficulty of Paying Living Expenses:   Food Insecurity:   .  Worried About Programme researcher, broadcasting/film/video in the Last Year:   . Barista in the Last Year:   Transportation Needs:   . Freight forwarder (Medical):   Marland Kitchen Lack of Transportation (Non-Medical):   Physical Activity:   . Days of Exercise per Week:   . Minutes of Exercise per Session:   Stress:   . Feeling of Stress :   Social Connections:   . Frequency of Communication with Friends and Family:   . Frequency of Social Gatherings with Friends and Family:   . Attends Religious Services:   . Active Member of Clubs or Organizations:   . Attends Banker Meetings:   Marland Kitchen Marital Status:   Intimate Partner Violence:   . Fear of Current or Ex-Partner:   . Emotionally Abused:   Marland Kitchen Physically Abused:   . Sexually Abused:       History reviewed. No pertinent family history.   Review of Systems Constitutional: negative for anorexia, fevers and sweats  Eyes: negative for irritation, redness and visual disturbance  Ears, nose, mouth, throat, and face: negative for earaches, epistaxis, nasal congestion and sore throat  Respiratory: negative for cough,  sputum and wheezing  Cardiovascular: negative for chest pain, lower extremity edema, orthopnea, palpitations and syncope  Gastrointestinal: negative for abdominal pain, constipation, diarrhea, melena, nausea and vomiting  Genitourinary:negative for dysuria, frequency and hematuria  Hematologic/lymphatic: negative for bleeding, easy bruising and lymphadenopathy  Musculoskeletal:negative for arthralgias, muscle weakness and stiff joints  Neurological: negative for coordination problems, gait problems, headaches and weakness  Endocrine: negative for diabetic symptoms including polydipsia, polyuria and weight loss     Objective:   Physical Exam   Gen. Pleasant, well-nourished, in no distress, normal affect ENT - no pallor,icterus, no post nasal drip Neck: No JVD, no thyromegaly, no carotid bruits Lungs: no use of accessory muscles, no dullness to percussion, bibasal rales no rhonchi  Cardiovascular: Rhythm regular, heart sounds  normal, no murmurs or gallops, no peripheral edema Abdomen: soft and non-tender, no hepatosplenomegaly, BS normal. Musculoskeletal: No deformities, no cyanosis or clubbing Neuro:  alert, non focal        Assessment & Plan:

## 2019-10-17 NOTE — Assessment & Plan Note (Addendum)
Presence of bibasilar crackles on exam with desaturation on ambulation and restrictive lung disease on PFTs, points towards ILD. We will obtain high-resolution CT chest to clarify She does not have any signs of her symptoms of connective tissue disease but if CT shows significant ILD then will proceed with basic serology based on pattern Detailed environmental history was obtained and does not point towards hypersensitivity pneumonitis

## 2019-10-18 NOTE — Telephone Encounter (Signed)
Called patient at [phone] and left voicemail that CT has been scheduled as well as date and time. Tried to call her on [phone] states its not in service. Nothing further needed at this time.

## 2019-10-22 DIAGNOSIS — Z1231 Encounter for screening mammogram for malignant neoplasm of breast: Secondary | ICD-10-CM | POA: Diagnosis not present

## 2019-10-23 DIAGNOSIS — I10 Essential (primary) hypertension: Secondary | ICD-10-CM | POA: Diagnosis not present

## 2019-10-30 DIAGNOSIS — N6489 Other specified disorders of breast: Secondary | ICD-10-CM | POA: Diagnosis not present

## 2019-10-30 DIAGNOSIS — N6311 Unspecified lump in the right breast, upper outer quadrant: Secondary | ICD-10-CM | POA: Diagnosis not present

## 2019-10-30 DIAGNOSIS — R928 Other abnormal and inconclusive findings on diagnostic imaging of breast: Secondary | ICD-10-CM | POA: Diagnosis not present

## 2019-11-01 ENCOUNTER — Ambulatory Visit (HOSPITAL_COMMUNITY)
Admission: RE | Admit: 2019-11-01 | Discharge: 2019-11-01 | Disposition: A | Discharge status: Home / Self Care | Payer: Medicare Other | Source: Ambulatory Visit | Attending: Pulmonary Disease | Admitting: Pulmonary Disease

## 2019-11-01 ENCOUNTER — Other Ambulatory Visit: Payer: Self-pay

## 2019-11-01 DIAGNOSIS — J849 Interstitial pulmonary disease, unspecified: Secondary | ICD-10-CM | POA: Diagnosis not present

## 2019-11-01 DIAGNOSIS — R590 Localized enlarged lymph nodes: Secondary | ICD-10-CM | POA: Diagnosis not present

## 2019-11-01 DIAGNOSIS — I2510 Atherosclerotic heart disease of native coronary artery without angina pectoris: Secondary | ICD-10-CM | POA: Diagnosis not present

## 2019-11-05 DIAGNOSIS — F329 Major depressive disorder, single episode, unspecified: Secondary | ICD-10-CM | POA: Diagnosis not present

## 2019-11-05 DIAGNOSIS — E039 Hypothyroidism, unspecified: Secondary | ICD-10-CM | POA: Diagnosis not present

## 2019-11-05 DIAGNOSIS — I10 Essential (primary) hypertension: Secondary | ICD-10-CM | POA: Diagnosis not present

## 2019-11-05 DIAGNOSIS — Z6823 Body mass index (BMI) 23.0-23.9, adult: Secondary | ICD-10-CM | POA: Diagnosis not present

## 2019-11-12 ENCOUNTER — Other Ambulatory Visit: Payer: Self-pay

## 2019-11-12 ENCOUNTER — Ambulatory Visit (INDEPENDENT_AMBULATORY_CARE_PROVIDER_SITE_OTHER): Payer: Medicare Other | Admitting: Pulmonary Disease

## 2019-11-12 ENCOUNTER — Encounter: Payer: Self-pay | Admitting: Pulmonary Disease

## 2019-11-12 ENCOUNTER — Other Ambulatory Visit (HOSPITAL_COMMUNITY)
Admission: RE | Admit: 2019-11-12 | Discharge: 2019-11-12 | Disposition: A | Discharge status: Home / Self Care | Payer: Medicare Other | Source: Ambulatory Visit | Attending: Pulmonary Disease | Admitting: Pulmonary Disease

## 2019-11-12 DIAGNOSIS — J849 Interstitial pulmonary disease, unspecified: Secondary | ICD-10-CM

## 2019-11-12 DIAGNOSIS — I10 Essential (primary) hypertension: Secondary | ICD-10-CM | POA: Diagnosis not present

## 2019-11-12 LAB — CBC WITH DIFFERENTIAL/PLATELET
Abs Immature Granulocytes: 0.01 10*3/uL (ref 0.00–0.07)
Basophils Absolute: 0 10*3/uL (ref 0.0–0.1)
Basophils Relative: 1 %
Eosinophils Absolute: 0.3 10*3/uL (ref 0.0–0.5)
Eosinophils Relative: 5 %
HCT: 39.9 % (ref 36.0–46.0)
Hemoglobin: 12.6 g/dL (ref 12.0–15.0)
Immature Granulocytes: 0 %
Lymphocytes Relative: 18 %
Lymphs Abs: 1 10*3/uL (ref 0.7–4.0)
MCH: 28.7 pg (ref 26.0–34.0)
MCHC: 31.6 g/dL (ref 30.0–36.0)
MCV: 90.9 fL (ref 80.0–100.0)
Monocytes Absolute: 0.5 10*3/uL (ref 0.1–1.0)
Monocytes Relative: 8 %
Neutro Abs: 3.9 10*3/uL (ref 1.7–7.7)
Neutrophils Relative %: 68 %
Platelets: 205 10*3/uL (ref 150–400)
RBC: 4.39 MIL/uL (ref 3.87–5.11)
RDW: 13.1 % (ref 11.5–15.5)
WBC: 5.7 10*3/uL (ref 4.0–10.5)
nRBC: 0 % (ref 0.0–0.2)

## 2019-11-12 LAB — COMPREHENSIVE METABOLIC PANEL
ALT: 11 U/L (ref 0–44)
AST: 16 U/L (ref 15–41)
Albumin: 4.6 g/dL (ref 3.5–5.0)
Alkaline Phosphatase: 71 U/L (ref 38–126)
Anion gap: 11 (ref 5–15)
BUN: 18 mg/dL (ref 8–23)
CO2: 27 mmol/L (ref 22–32)
Calcium: 9.7 mg/dL (ref 8.9–10.3)
Chloride: 102 mmol/L (ref 98–111)
Creatinine, Ser: 0.7 mg/dL (ref 0.44–1.00)
GFR calc Af Amer: >60 mL/min (ref >60)
GFR calc non Af Amer: >60 mL/min (ref >60)
Glucose, Bld: 123 mg/dL — ABNORMAL HIGH (ref 70–99)
Potassium: 4 mmol/L (ref 3.5–5.1)
Sodium: 140 mmol/L (ref 135–145)
Total Bilirubin: 0.9 mg/dL (ref 0.3–1.2)
Total Protein: 7.4 g/dL (ref 6.5–8.1)

## 2019-11-12 NOTE — Assessment & Plan Note (Signed)
Favor IPF based on finding of probable UIP on HRCT, personally reviewed images and discussed findings with patient-peripheral and basilar predominance, early honeycombing not typical, minimal groundglass As such doubt that we need biopsy here To [place] neurology We will try to obtain breathing test results from [facility]  Based on above, we will start you on 1 of 2 antifibrotic medications ESBRIET or OFEV  -We discussed side effects of each medication.  We also discussed disease progression of IPF

## 2019-11-12 NOTE — Progress Notes (Addendum)
   Subjective:    Patient ID: Debra Jimenez, female    DOB: 1951/06/09, 69 y.o.   MRN: [id number]  HPI  69 year old remote smoker for follow-up of ILD, symptom onset since 2019 -New onset hypertension on lisinopril  Chief Complaint  Patient presents with  . Follow-up    Patient is on Lisinopril was on 69 mg and is now on 20 and is having dry cough. Patient feels like breathing is a little better but still has shortness of breath with exertion.    Blood pressure remains high, denies nausea or vomiting or headache. Lisinopril has been increased to 20 mg. Complains of dry cough.  We were still unable to obtain PFT results from [facility], she also underwent stress test there  Breathing appears stable, no wheezing, albuterol seems to provide some relief  Sister has lupus, environmental history again reviewed    Significant tests/ events reviewed HRCT 10/2019 "probable" UIP , basilar predominance, early honeycombing , mild mediastinal lymphadenopathy  PFTs -[facility] -moderate intraparenchymal restriction, ratio 88, FVC 56%/1.70, DLCO 8.7/44%, TLC 3.21/64% Chest x-ray 3/29-bilateral interstitial changes  On ambulation, heart rate increased from 90-1 08 and oxygen saturation dropped from 97% to 91%  Review of Systems neg for any significant sore throat, dysphagia, itching, sneezing, nasal congestion or excess/ purulent secretions, fever, chills, sweats, unintended wt loss, pleuritic or exertional cp, hempoptysis, orthopnea pnd or change in chronic leg swelling. Also denies presyncope, palpitations, heartburn, abdominal pain, nausea, vomiting, diarrhea or change in bowel or urinary habits, dysuria,hematuria, rash, arthralgias, visual complaints, headache, numbness weakness or ataxia.     Objective:   Physical Exam   Gen. Pleasant, well-nourished, in no distress ENT - no thrush, no pallor/icterus,no post nasal drip Neck: No JVD, no thyromegaly, no carotid bruits Lungs:  no use of accessory muscles, no dullness to percussion, bibasal dry  rales no rhonchi  Cardiovascular: Rhythm regular, heart sounds  normal, no murmurs or gallops, no peripheral edema Musculoskeletal: No deformities, no cyanosis or clubbing       Assessment & Plan:

## 2019-11-12 NOTE — Assessment & Plan Note (Signed)
Blood pressure remains high, concern here is that lisinopril may confound cough  Lisinopril can cause a dry cough Suggest changing this to other medication in the same family such as LOSARTAN -please discuss with your PCP

## 2019-11-12 NOTE — Patient Instructions (Signed)
You likely have pulmonary fibrosis. Blood work today to rule out other causes of inflammation in the lungs  We will try to obtain breathing test results from [facility]  Lisinopril can cause a dry cough Suggest changing this to other medication in the same family such as LOSARTAN -please discuss with your PCP  Based on above, we will start you on 1 of 2 antifibrotic medications ESBRIET or OFEV

## 2019-11-13 ENCOUNTER — Ambulatory Visit: Payer: Medicare Other | Admitting: Pulmonary Disease

## 2019-11-13 LAB — MISC LABCORP TEST (SEND OUT): Labcorp test code: 340897

## 2019-11-13 LAB — ANGIOTENSIN CONVERTING ENZYME: Angiotensin-Converting Enzyme: <5 U/L — ABNORMAL LOW (ref 14–82)

## 2019-11-14 LAB — CYCLIC CITRUL PEPTIDE ANTIBODY, IGG/IGA: CCP Antibodies IgG/IgA: 4 units (ref 0–19)

## 2019-11-21 ENCOUNTER — Telehealth: Payer: Self-pay | Admitting: Pulmonary Disease

## 2019-11-21 NOTE — Telephone Encounter (Signed)
Patient called and understands results are not available and we will call her as soon as [name] reviews the results.

## 2019-11-27 ENCOUNTER — Telehealth: Payer: Self-pay | Admitting: Pulmonary Disease

## 2019-11-27 NOTE — Telephone Encounter (Signed)
Patient contacted as [name] has not resulted all the labs from 11/12/2019. Will route to [name] for review. Patient is very anxious to hear back from [place] regarding her results.

## 2019-12-02 NOTE — Telephone Encounter (Signed)
Blood work was negative for inflammation. We are still awaiting PFTs from [facility] We had discussed Esbriet versus O FEV last OV -if she is agreeable to start one of the other, can make appointment with pharmacy if necessary

## 2019-12-02 NOTE — Telephone Encounter (Addendum)
Called Debra Jimenez and advised message from the provider. Debra Jimenez understood and verbalized understanding but she has a lot of questions. Can I put her in a held spot this week for a televisit? She really wants to discuss with [name]. I tried to answer most of her questions.

## 2019-12-03 NOTE — Telephone Encounter (Signed)
No appts for [name] until 12/31/2019. I called pt and she agreed to speak to an NP about her questions. Scheduled her an OV with TP on Thursday 12/05/2019 at 1:30pm. PFT scheduled for 01/13/2020 at 4:00pm. Nothing further is needed.

## 2019-12-03 NOTE — Telephone Encounter (Signed)
Please obtain PFTs Then, OV next week at [place] if open spot

## 2019-12-05 ENCOUNTER — Encounter: Payer: Self-pay | Admitting: Adult Health

## 2019-12-05 ENCOUNTER — Other Ambulatory Visit: Payer: Self-pay

## 2019-12-05 ENCOUNTER — Ambulatory Visit (INDEPENDENT_AMBULATORY_CARE_PROVIDER_SITE_OTHER): Payer: Medicare Other | Admitting: Adult Health

## 2019-12-05 DIAGNOSIS — J849 Interstitial pulmonary disease, unspecified: Secondary | ICD-10-CM | POA: Diagnosis not present

## 2019-12-05 NOTE — Patient Instructions (Addendum)
Begin OFEV Twice daily  .  Refer to Pharmacy team to help with [facility] approval process.  Refer to Pulmonary rehab at [facility] (under [name]  ) .  Follow up next month with PFT as planned.  Follow up with [name]  In 2 months with labs . [place] office

## 2019-12-05 NOTE — Progress Notes (Signed)
Virtual Visit via Telephone Note  I connected with Debra Jimenez on 12/05/19 at  1:30 PM EDT by telephone and verified that I am speaking with the correct person using two identifiers.  Location: Patient: Home  Provider: Home Office    I discussed the limitations, risks, security and privacy concerns of performing an evaluation and management service by telephone and the availability of in person appointments. I also discussed with the patient that there may be a patient responsible charge related to this service. The patient expressed understanding and agreed to proceed.   History of Present Illness: 69 year old former smoker followed for ILD .  Medical history significant for HTN   Today's televisit is a 1 month follow up for ILD . Patient has been undergoing workup for ILD with symptom onset ~2019. Gets winded with activities and needs to rest. Has associated dry cough . ACE inhibitor changed since last office visit and cough has decreased .  Workup has shown probable UIP with basilar predominance , early honeycombing . Autoimmune and CTD labs negative. Previous PFT showed moderate restriction with FVC at 56%, and DLCO 44%.  Says she is very active and walks a lot.  Was able to go to [facility] in 09/2019 and walk around . Did get short of breath but was able to rest and continue with out issues.  Walks but get winded but continues to walk through and rest when needed. Able to do yard work. And house hold chores with rest breaks.   last visit antifibrotics were discussed . She would like to proceed with OFEV. Patient education was given.  LFT last month normal .    Patient Active Problem List   Diagnosis Date Noted  . Hypertension, essential 11/12/2019  . ILD (interstitial lung disease) ([place]) 10/17/2019  . S/P right THA, AA 07/17/2018    Current Outpatient Medications on File Prior to Visit  Medication Sig Dispense Refill  . albuterol (VENTOLIN HFA) 108 (90 Base) MCG/ACT inhaler Inhale  1 puff into the lungs as needed.    Marland Kitchen levothyroxine (SYNTHROID, LEVOTHROID) 88 MCG tablet Take 88 mcg by mouth daily before breakfast.    . lisinopril (ZESTRIL) 10 MG tablet Take 20 mg by mouth daily.     Marland Kitchen zolpidem (AMBIEN) 10 MG tablet Take 10 mg by mouth at bedtime.     No current facility-administered medications on file prior to visit.    Observations/Objective: GI w/u 2004:  Mod BS nl, nl 24 h pH off all GI meds > rec d/c GERD rx  Per Dr Henrene Pastor   [id number] showed mild restriction with no airflow obstruction and a diffusing defect.  CT chest 2017 showed stable linear scarring in both lungs stable groundglass opacities likely consistent with chronic inflammation. CT sinus February 2015 chronic sphenoid sinusitis septal deviation CT chest 07/31/13 >Patchy areas of paraseptal and central lobar emphysema. . Patchy ground-glass opacities suggest a partial airspace filling process and could reflect mild edema or inflammation. . Linear scarring changes in both lungs. 2018 Eosinophils 100  Chest x-ray September 2019 bronchitic changes with scarring in both lungs 08/2017 - nml ESR, neg ANA and RA factor   Assessment and Plan: ILD -probable UIP .  Autoimmune/CTD w/up negative.  Moderate restriction with diffucing defect .   Needs repeat PFT .  Begin Antifibrotic - OFEV.  Refer to pulmonary rehab.   Plan  Patient Instructions  Begin OFEV Twice daily  .  Refer to Pharmacy team to help with [facility] approval  process.  Refer to Pulmonary rehab at [facility] (under Dr. Elsworth Soho  ) .  Follow up next month with PFT as planned.  Follow up with Dr. Elsworth Soho  In 2 months with labs .      Follow Up Instructions: Follow up in 2 months and As needed     I discussed the assessment and treatment plan with the patient. The patient was provided an opportunity to ask questions and all were answered. The patient agreed with the plan and demonstrated an understanding of the instructions.   The patient was  advised to call back or seek an in-person evaluation if the symptoms worsen or if the condition fails to improve as anticipated.  I provided 35 minutes of non-face-to-face time during this encounter.   Rexene Edison, NP

## 2019-12-09 ENCOUNTER — Telehealth: Payer: Self-pay | Admitting: Pharmacy Technician

## 2019-12-09 NOTE — Progress Notes (Signed)
HPI  Patient presents today to [facility] for Initial appt with pharmacy team for Ofev. Pertinent past medical history includes . Prior therapy includes ILD (probable UIP) and HTN .  OBJECTIVE No Known Allergies  Outpatient Encounter Medications as of 12/10/2019  Medication Sig  . albuterol (VENTOLIN HFA) 108 (90 Base) MCG/ACT inhaler Inhale 1 puff into the lungs as needed.  Marland Kitchen levothyroxine (SYNTHROID, LEVOTHROID) 88 MCG tablet Take 88 mcg by mouth daily before breakfast.  . lisinopril (ZESTRIL) 10 MG tablet Take 20 mg by mouth daily.   Marland Kitchen zolpidem (AMBIEN) 10 MG tablet Take 10 mg by mouth at bedtime.   No facility-administered encounter medications on file as of 12/10/2019.     Immunization History  Administered Date(s) Administered  . Fluad Quad(high Dose 65+) 06/27/2018  . Influenza, High Dose Seasonal PF 04/05/2017  . Influenza,inj,quad, With Preservative 02/19/2018  . Moderna SARS-COVID-2 Vaccination 07/11/2019, 08/16/2019  . Pneumococcal Conjugate-13 05/25/2016  . Tdap 01/13/2010, 03/12/2018     HRCT  PFT's No results found for: FEV1, FVC, FEV1FVC, TLC, DLCO   CMP     Component Value Date/Time   NA 140 11/12/2019 1134   K 4.0 11/12/2019 1134   CL 102 11/12/2019 1134   CO2 27 11/12/2019 1134   GLUCOSE 123 (H) 11/12/2019 1134   BUN 18 11/12/2019 1134   CREATININE 0.70 11/12/2019 1134   CALCIUM 9.7 11/12/2019 1134   PROT 7.4 11/12/2019 1134   ALBUMIN 4.6 11/12/2019 1134   AST 16 11/12/2019 1134   ALT 11 11/12/2019 1134   ALKPHOS 71 11/12/2019 1134   BILITOT 0.9 11/12/2019 1134   GFRNONAA >60 11/12/2019 1134   GFRAA >60 11/12/2019 1134     CBC    Component Value Date/Time   WBC 5.7 11/12/2019 1134   RBC 4.39 11/12/2019 1134   HGB 12.6 11/12/2019 1134   HCT 39.9 11/12/2019 1134   PLT 205 11/12/2019 1134   MCV 90.9 11/12/2019 1134   MCH 28.7 11/12/2019 1134   MCHC 31.6 11/12/2019 1134   RDW 13.1 11/12/2019 1134   LYMPHSABS 1.0 11/12/2019 1134    MONOABS 0.5 11/12/2019 1134   EOSABS 0.3 11/12/2019 1134   BASOSABS 0.0 11/12/2019 1134     LFT's Hepatic Function Latest Ref Rng & Units 11/12/2019  Total Protein 6.5 - 8.1 g/dL 7.4  Albumin 3.5 - 5.0 g/dL 4.6  AST 15 - 41 U/L 16  ALT 0 - 44 U/L 11  Alk Phosphatase 38 - 126 U/L 71  Total Bilirubin 0.3 - 1.2 mg/dL 0.9     ASSESSMENT  1. Ofev Medication Management  Patient counseled on purpose, proper use, and potential adverse effects including diarrhea, nausea, vomiting, abdominal pain, decreased appetite, weight loss, and increased blood pressure. Stressed the importance of routine lab monitoring. Will monitor LFT's every month for the first 6 months of treatment then every 3 months. Will monitor CBC every 3 months.  Baseline CBC/LFT's drawn on 11/12/19 and within normal limits.  Ofev dose will be 150 mg capsule every 12 hours with food. Stressed importance of taking with food to minimize stomach upset.  Ofev was approved through insurance but unaffordable at $2,753.72 for 1 month supply. Patient filled out application today for patient assistance and brought income documents to fax for approval.  Will update when we receive a response. If approved she will need to fill through [facility] pharmacy.  2. Medication Reconciliation  A drug regimen assessment was performed, including review of allergies,  interactions, disease-state management, dosing and immunization history. Medications were reviewed with the patient, including name, instructions, indication, goals of therapy, potential side effects, importance of adherence, and safe use.  Drug interaction(s): no major interactions identified  3. Immunizations  Patient is up to date with annual influenza, Prevnar 13, Tdap and Covid-19 vaccines.  Recommend Penumovax-23 and Shingrix.  PLAN  Apply for Ofev patient assistance.  Start Ofev 150 mg 1 capsule twice daily once approved.  Hepatic panel monthly for the first 6  months of therapy and CBC every 3 months.  Standing orders placed.  All questions encouraged and answered.  Instructed patient to call with any further questions or concerns.  Thank you for allowing pharmacy to participate in this patient's care.  This appointment required  60 minutes of patient care (this includes precharting, chart review, review of results, face-to-face care, etc.).   Mariella Saa, PharmD, [place], CPP Clinical Specialty Pharmacist (Rheumatology and Pulmonology)  12/10/2019 4:21 PM

## 2019-12-09 NOTE — Telephone Encounter (Signed)
Submitted a Prior Authorization request to [employer] for OFEV via Cover My Meds. Will update once we receive a response.   (Key: [id number]) - [id number]

## 2019-12-10 ENCOUNTER — Other Ambulatory Visit: Payer: Self-pay

## 2019-12-10 ENCOUNTER — Ambulatory Visit: Payer: Medicare Other | Admitting: Pharmacist

## 2019-12-10 DIAGNOSIS — Z5181 Encounter for therapeutic drug level monitoring: Secondary | ICD-10-CM

## 2019-12-10 NOTE — Telephone Encounter (Signed)
Received notification from [facility] regarding a prior authorization for OFEV. Authorization has been APPROVED from 09/10/19 to 12/08/20.   Authorization # [id number] Phone # [phone]  Ran test claim, patient's copay is $2,753.72 for 1 month supply. Will look into patient assistance for patient. Spoke with patient and she will bring income to her appointment today.

## 2019-12-10 NOTE — Patient Instructions (Addendum)
Thank you for meeting with the pharmacy team!  Below find a recap of what we discussed at your visit:  Applying for Ofev patient assistance.  We will update you when we hear a response.  Once approved start Ofev 150 mg 1 capsule twice daily  If you experience nausea you can try flat ginger ale, ginger tea, ginger chews Please call if nausea is unbearable and we can discuss anti-nausea prescriptions or adjusting the dose. If you experience diarrhea you can take Imodium over the counter  Take 2 capsules at first loose stool  Then take 1 capsule after each subsequent loose stool  Do not take more than 8 capsules in 1 day  Call [phone] with any questions or concerns.   Verlin Fester, PharmD, [place], CPP Clinical Specialty Pharmacist (Rheumatology and Pulmonology)  12/10/2019 3:03 PM

## 2019-12-11 NOTE — Telephone Encounter (Signed)
Placed Ofev PAP application in MD box for signature. Will follow up.

## 2019-12-13 ENCOUNTER — Other Ambulatory Visit (HOSPITAL_COMMUNITY)
Admission: RE | Admit: 2019-12-13 | Discharge: 2019-12-13 | Disposition: A | Discharge status: Home / Self Care | Payer: Medicare Other | Source: Ambulatory Visit | Attending: Pharmacist | Admitting: Pharmacist

## 2019-12-13 ENCOUNTER — Other Ambulatory Visit: Payer: Self-pay

## 2019-12-13 DIAGNOSIS — Z5181 Encounter for therapeutic drug level monitoring: Secondary | ICD-10-CM | POA: Diagnosis present

## 2019-12-13 LAB — CBC
HCT: 40.1 % (ref 36.0–46.0)
Hemoglobin: 12.7 g/dL (ref 12.0–15.0)
MCH: 29.3 pg (ref 26.0–34.0)
MCHC: 31.7 g/dL (ref 30.0–36.0)
MCV: 92.4 fL (ref 80.0–100.0)
Platelets: 216 10*3/uL (ref 150–400)
RBC: 4.34 MIL/uL (ref 3.87–5.11)
RDW: 13.5 % (ref 11.5–15.5)
WBC: 6.9 10*3/uL (ref 4.0–10.5)
nRBC: 0 % (ref 0.0–0.2)

## 2019-12-13 LAB — HEPATIC FUNCTION PANEL
ALT: 15 U/L (ref 0–44)
AST: 19 U/L (ref 15–41)
Albumin: 4.3 g/dL (ref 3.5–5.0)
Alkaline Phosphatase: 72 U/L (ref 38–126)
Bilirubin, Direct: 0.1 mg/dL (ref 0.0–0.2)
Indirect Bilirubin: 0.9 mg/dL (ref 0.3–0.9)
Total Bilirubin: 1 mg/dL (ref 0.3–1.2)
Total Protein: 7.3 g/dL (ref 6.5–8.1)

## 2019-12-13 NOTE — Telephone Encounter (Signed)
Received signed application back from MD. Faxed to [facility].  Phone# [phone]

## 2019-12-20 NOTE — Telephone Encounter (Signed)
Pt calling stating she received letter and couldn't process due to needing prescription.  Please advise.  [phone].  Fax:  [phone] ([employer] pt assistance program)

## 2019-12-26 NOTE — Telephone Encounter (Signed)
Called twice daily cares to inquire about patient application status.  Was informed that page with prescription had invalid date of 66/24/21.  They require a new form with valid date.  Called to inform patient.  Advised we will fax in an updated document and will follow up on Monday on status of application.  Patient verbalized understanding.  Faxed updated form to [facility] cares.  Fax#[phone]   Verlin Fester, PharmD, BCACP, CPP Clinical Specialty Pharmacist (Rheumatology and Pulmonology)  12/26/2019 4:15 PM

## 2020-01-01 NOTE — Telephone Encounter (Signed)
Received notification from  [facility] regarding an approval for OFEV from 12/27/19 to 06/19/20.   1st shipment was on 12/27/2019  Phone number:[phone]

## 2020-01-10 ENCOUNTER — Other Ambulatory Visit: Payer: Self-pay | Admitting: Pulmonary Disease

## 2020-01-10 DIAGNOSIS — J849 Interstitial pulmonary disease, unspecified: Secondary | ICD-10-CM

## 2020-01-13 ENCOUNTER — Other Ambulatory Visit: Payer: Self-pay

## 2020-01-13 ENCOUNTER — Ambulatory Visit (INDEPENDENT_AMBULATORY_CARE_PROVIDER_SITE_OTHER): Payer: Medicare Other | Admitting: Pulmonary Disease

## 2020-01-13 DIAGNOSIS — J849 Interstitial pulmonary disease, unspecified: Secondary | ICD-10-CM

## 2020-01-13 LAB — PULMONARY FUNCTION TEST
DL/VA % pred: 112 %
DL/VA: 4.64 ml/min/mmHg/L
DLCO cor % pred: 60 %
DLCO cor: 12.22 ml/min/mmHg
DLCO unc % pred: 60 %
DLCO unc: 12.22 ml/min/mmHg
FEF 25-75 Post: 2.07 L/sec
FEF 25-75 Pre: 1.17 L/sec
FEF2575-%Change-Post: 76 %
FEF2575-%Pred-Post: 103 %
FEF2575-%Pred-Pre: 58 %
FEV1-%Change-Post: -1 %
FEV1-%Pred-Post: 48 %
FEV1-%Pred-Pre: 49 %
FEV1-Post: 1.17 L
FEV1-Pre: 1.19 L
FEV1FVC-%Change-Post: -4 %
FEV1FVC-%Pred-Pre: 106 %
FEV6-%Change-Post: 1 %
FEV6-%Pred-Post: 49 %
FEV6-%Pred-Pre: 48 %
FEV6-Post: 1.48 L
FEV6-Pre: 1.47 L
FEV6FVC-%Pred-Post: 104 %
FEV6FVC-%Pred-Pre: 104 %
FVC-%Change-Post: 2 %
FVC-%Pred-Post: 47 %
FVC-%Pred-Pre: 46 %
FVC-Post: 1.51 L
FVC-Pre: 1.47 L
Post FEV1/FVC ratio: 78 %
Post FEV6/FVC ratio: 100 %
Pre FEV1/FVC ratio: 81 %
Pre FEV6/FVC Ratio: 100 %
RV % pred: 90 %
RV: 2 L
TLC % pred: 67 %
TLC: 3.49 L

## 2020-01-13 NOTE — Progress Notes (Signed)
PFT done today. 

## 2020-01-20 ENCOUNTER — Telehealth: Payer: Self-pay | Admitting: Pulmonary Disease

## 2020-01-20 NOTE — Telephone Encounter (Signed)
Patient contacted and had some questions about her PFT. Plan is to discuss in detail at next office visit with the doctor. Patient agreed with plan.

## 2020-01-21 ENCOUNTER — Other Ambulatory Visit: Payer: Self-pay

## 2020-02-17 ENCOUNTER — Other Ambulatory Visit (HOSPITAL_COMMUNITY)
Admission: RE | Admit: 2020-02-17 | Discharge: 2020-02-17 | Disposition: A | Discharge status: Home / Self Care | Payer: Medicare Other | Source: Ambulatory Visit | Attending: Pulmonary Disease | Admitting: Pulmonary Disease

## 2020-02-17 ENCOUNTER — Other Ambulatory Visit: Payer: Self-pay

## 2020-02-17 ENCOUNTER — Encounter: Payer: Self-pay | Admitting: Pulmonary Disease

## 2020-02-17 ENCOUNTER — Ambulatory Visit (INDEPENDENT_AMBULATORY_CARE_PROVIDER_SITE_OTHER): Payer: Medicare Other | Admitting: Pulmonary Disease

## 2020-02-17 VITALS — BP 170/96 | HR 66 | Temp 97.1°F | Ht 65.0 in | Wt 137.0 lb

## 2020-02-17 DIAGNOSIS — J849 Interstitial pulmonary disease, unspecified: Secondary | ICD-10-CM

## 2020-02-17 DIAGNOSIS — Z5181 Encounter for therapeutic drug level monitoring: Secondary | ICD-10-CM | POA: Diagnosis not present

## 2020-02-17 DIAGNOSIS — I10 Essential (primary) hypertension: Secondary | ICD-10-CM | POA: Diagnosis not present

## 2020-02-17 LAB — CBC WITH DIFFERENTIAL/PLATELET
Abs Immature Granulocytes: 0.01 10*3/uL (ref 0.00–0.07)
Basophils Absolute: 0 10*3/uL (ref 0.0–0.1)
Basophils Relative: 1 %
Eosinophils Absolute: 0.3 10*3/uL (ref 0.0–0.5)
Eosinophils Relative: 6 %
HCT: 39.7 % (ref 36.0–46.0)
Hemoglobin: 12.9 g/dL (ref 12.0–15.0)
Immature Granulocytes: 0 %
Lymphocytes Relative: 16 %
Lymphs Abs: 0.9 10*3/uL (ref 0.7–4.0)
MCH: 30.5 pg (ref 26.0–34.0)
MCHC: 32.5 g/dL (ref 30.0–36.0)
MCV: 93.9 fL (ref 80.0–100.0)
Monocytes Absolute: 0.4 10*3/uL (ref 0.1–1.0)
Monocytes Relative: 7 %
Neutro Abs: 4.1 10*3/uL (ref 1.7–7.7)
Neutrophils Relative %: 70 %
Platelets: 227 10*3/uL (ref 150–400)
RBC: 4.23 MIL/uL (ref 3.87–5.11)
RDW: 13.8 % (ref 11.5–15.5)
WBC: 5.7 10*3/uL (ref 4.0–10.5)
nRBC: 0 % (ref 0.0–0.2)

## 2020-02-17 LAB — HEPATIC FUNCTION PANEL
ALT: 111 U/L — ABNORMAL HIGH (ref 0–44)
AST: 59 U/L — ABNORMAL HIGH (ref 15–41)
Albumin: 3.9 g/dL (ref 3.5–5.0)
Alkaline Phosphatase: 156 U/L — ABNORMAL HIGH (ref 38–126)
Bilirubin, Direct: 0.2 mg/dL (ref 0.0–0.2)
Indirect Bilirubin: 0.8 mg/dL (ref 0.3–0.9)
Total Bilirubin: 1 mg/dL (ref 0.3–1.2)
Total Protein: 7.1 g/dL (ref 6.5–8.1)

## 2020-02-17 NOTE — Progress Notes (Signed)
   Subjective:    Patient ID: Debra Jimenez, female    DOB: Jun 05, 1951, 69 y.o.   MRN: [id number]  HPI  69 year old remote smoker for follow-up of ILD, symptom onset since 2019 -New onset hypertension on lisinopril   Chief Complaint  Patient presents with  . Follow-up    Patient is on Ofev states that she has her good and bad days. She is not a breakfast person and states that she was not doing good taking it in the morning 10 am and then second at supper and has not been feeling as sick. Dealing with constipation more than diarheea.  Has been walking a lot more and lost some weight. Not much of an appetite and tired all the time.    We reviewed PFTs today. She started taking Ofev 3 weeks ago and we went through various issues related to medication, she had severe nausea when she took it early morning and has settled down to taking it at 10 AM and 10 PM, she does not have breakfast in the morning  We discussed monitoring for this medication  She had a list of 15 questions that we went through in detail including progression of disease  Significant tests/ events reviewed  HRCT 10/2019 "69 year old remote smoker for follow-up of ILD, symptom onset since 2019 -New onset hypertension on lisinopril  HPI  69 year old remote smoker for follow-up of ILD, symptom onset since 2019 -New onset hypertension on lisinopril   Chest x-ray 3/29-bilateral interstitial changes  On ambulation, heart rate increased from 90-1 08 and oxygen saturation dropped from 97% to 91%    Review of Systems Patient denies significant dyspnea,cough, hemoptysis,  chest pain, palpitations, pedal edema, orthopnea, paroxysmal nocturnal dyspnea, lightheadedness, nausea, vomiting, abdominal or  leg pains      Objective:   Physical Exam  Gen. Pleasant, well-nourished, in no distress ENT - no thrush, no pallor/icterus,no post nasal drip Neck: No JVD, no thyromegaly, no carotid  bruits Lungs: no use of accessory muscles, no dullness to percussion, bibasal rales no rhonchi  Cardiovascular: Rhythm regular, heart sounds  normal, no murmurs or gallops, no peripheral edema Musculoskeletal: No deformities, no cyanosis or clubbing        Assessment & Plan:

## 2020-02-17 NOTE — Assessment & Plan Note (Signed)
She had 10% drop in FVC from 20 19-20 21, however TLC and DLCO are stable compared to 2019.  We will still consider this as a progressive phenotype.  She has now been started on Ofev which she is tolerating reasonably well and hopefully we can stabilize this disease. I have asked her to check LFTs in 1 week on the 10th of every month for the next 5 months as a part of monitoring for antifibrotic medication.  CBC can be checked every 3 months  Her questions regarding Covid vaccination and an impending visit to [place] to visit with her children and grandkids were discussed in detail, all her questions were answered  Total time spent 42 minutes

## 2020-02-17 NOTE — Assessment & Plan Note (Signed)
Blood pressure high today, in spite of taking losartan.  She does report considerable anxiety She will recheck at home

## 2020-02-17 NOTE — Progress Notes (Signed)
Called and spoke with patient about lab work results from today and let her know to stop Ofev for 1 week and then repeat blood work. Order has been placed. She expressed understanding. Nothing further needed at this time.

## 2020-02-17 NOTE — Patient Instructions (Signed)
Blood work today - CBC, LFTs LFTs every month x 5 Ambulatory saturation

## 2020-02-25 ENCOUNTER — Other Ambulatory Visit (HOSPITAL_COMMUNITY)
Admission: RE | Admit: 2020-02-25 | Discharge: 2020-02-25 | Disposition: A | Discharge status: Home / Self Care | Payer: Medicare Other | Source: Ambulatory Visit | Attending: Pulmonary Disease | Admitting: Pulmonary Disease

## 2020-02-25 DIAGNOSIS — J849 Interstitial pulmonary disease, unspecified: Secondary | ICD-10-CM | POA: Diagnosis not present

## 2020-02-25 LAB — HEPATIC FUNCTION PANEL
ALT: 29 U/L (ref 0–44)
AST: 21 U/L (ref 15–41)
Albumin: 3.8 g/dL (ref 3.5–5.0)
Alkaline Phosphatase: 101 U/L (ref 38–126)
Bilirubin, Direct: 0.1 mg/dL (ref 0.0–0.2)
Indirect Bilirubin: 1.2 mg/dL — ABNORMAL HIGH (ref 0.3–0.9)
Total Bilirubin: 1.3 mg/dL — ABNORMAL HIGH (ref 0.3–1.2)
Total Protein: 6.9 g/dL (ref 6.5–8.1)

## 2020-02-27 ENCOUNTER — Telehealth: Payer: Self-pay | Admitting: Pulmonary Disease

## 2020-02-29 NOTE — Telephone Encounter (Signed)
Defer to pharmacy for recommendations. I would suggest restart at lower dose 100 twice daily

## 2020-02-29 NOTE — Telephone Encounter (Signed)
Result Notes  Oretha Milch, MD  02/28/2020 5:07 PM EDT Back to Top    LFTs have normalized   Called and spoke with pt letting her know the results of labwork. Pt verbalized understanding. Pt wants to know if it would be okay for her to resume OFEV now that labwork has become normal. If it is okay for her to begin the OFEV again, pt wants to know if it is okay to restart it once she returns after her trip.  [name], please advise.

## 2020-03-01 ENCOUNTER — Other Ambulatory Visit: Payer: Self-pay | Admitting: Pulmonary Disease

## 2020-03-01 MED ORDER — OFEV 100 MG PO CAPS: 100.0000 mg | ORAL_CAPSULE | Freq: Two times a day (BID) | ORAL | 3 refills | Status: DC

## 2020-03-01 NOTE — Telephone Encounter (Signed)
Pt was left a vm to restart ofev 100 mg twice daily and if she has further questions she can give office a call

## 2020-03-01 NOTE — Telephone Encounter (Signed)
Sending to pharmacy for opinion

## 2020-03-01 NOTE — Progress Notes (Unsigned)
Dr Vassie Loll wants her to switch to 100 mg ofev sent you a message on teams

## 2020-03-01 NOTE — Telephone Encounter (Signed)
Rx for ofev 100mg  sent to patient pharmacy

## 2020-03-02 ENCOUNTER — Telehealth: Payer: Self-pay | Admitting: Pulmonary Disease

## 2020-03-02 NOTE — Telephone Encounter (Signed)
ATC patient about recommendations on Ofev from Pharmacy team. Per DPR left detailed message for patient to resume Ofev at 100 mg twice daily until follow up. Nothing further needed at this time.

## 2020-03-02 NOTE — Progress Notes (Signed)
Patient receives medication through [facility] Patient Assistance. Their pharmacy is [facility] in [place]. I added the pharmacy to her pharmacy list.

## 2020-03-02 NOTE — Telephone Encounter (Signed)
Note ATC patient about recommendations on Ofev from Pharmacy team. Per DPR left detailed message for patient to resume Ofev at 100 mg twice daily until follow up. Nothing further needed at this time.      Spoke with the pt and notified of the above recs and she verbalized understanding. Nothing further needed.

## 2020-03-02 NOTE — Telephone Encounter (Signed)
I agree with starting lower dosing of 100 mg twice daily.  We can do a trial of increased dose at another time if her LFT's remain stable on lower dose.

## 2020-03-05 ENCOUNTER — Telehealth: Payer: Self-pay | Admitting: Pulmonary Disease

## 2020-03-05 DIAGNOSIS — J849 Interstitial pulmonary disease, unspecified: Secondary | ICD-10-CM

## 2020-03-05 MED ORDER — OFEV 100 MG PO CAPS: 100.0000 mg | ORAL_CAPSULE | Freq: Two times a day (BID) | ORAL | 3 refills | Status: DC

## 2020-03-05 NOTE — Telephone Encounter (Signed)
Called and spoke with pt letting her know the info stated by [facility] and she verbalized understanding. Nothing further needed.

## 2020-03-05 NOTE — Telephone Encounter (Signed)
Patient can ignore calls from [facility] pharmacy or tell them she receives from another pharmacy.  The prescription should have been sent to [facility] pharmacy for [facility].  Prescription sent to correct pharmacy.  Please notify patient.   Verlin Fester, PharmD, [place], CPP Clinical Specialty Pharmacist (Rheumatology and Pulmonology)  03/05/2020 1:19 PM

## 2020-03-12 ENCOUNTER — Telehealth: Payer: Self-pay | Admitting: Pulmonary Disease

## 2020-03-12 NOTE — Telephone Encounter (Signed)
Patient called to notify the office that she was able to set up shipment for Ofev but will not deliver until 10/4.

## 2020-03-26 DIAGNOSIS — Z23 Encounter for immunization: Secondary | ICD-10-CM | POA: Diagnosis not present

## 2020-04-27 ENCOUNTER — Other Ambulatory Visit: Payer: Self-pay

## 2020-04-27 ENCOUNTER — Other Ambulatory Visit (HOSPITAL_COMMUNITY)
Admission: RE | Admit: 2020-04-27 | Discharge: 2020-04-27 | Disposition: A | Discharge status: Home / Self Care | Payer: Medicare Other | Source: Ambulatory Visit | Attending: Pulmonary Disease | Admitting: Pulmonary Disease

## 2020-04-27 DIAGNOSIS — J849 Interstitial pulmonary disease, unspecified: Secondary | ICD-10-CM | POA: Diagnosis not present

## 2020-04-27 LAB — HEPATIC FUNCTION PANEL
ALT: 19 U/L (ref 0–44)
AST: 19 U/L (ref 15–41)
Albumin: 4.1 g/dL (ref 3.5–5.0)
Alkaline Phosphatase: 74 U/L (ref 38–126)
Bilirubin, Direct: 0.1 mg/dL (ref 0.0–0.2)
Indirect Bilirubin: 0.9 mg/dL (ref 0.3–0.9)
Total Bilirubin: 1 mg/dL (ref 0.3–1.2)
Total Protein: 7.2 g/dL (ref 6.5–8.1)

## 2020-05-05 ENCOUNTER — Telehealth: Payer: Self-pay | Admitting: Pulmonary Disease

## 2020-05-05 DIAGNOSIS — Z23 Encounter for immunization: Secondary | ICD-10-CM | POA: Diagnosis not present

## 2020-05-05 NOTE — Telephone Encounter (Signed)
Called and spoke with pt and she is aware of lab results per TP.

## 2020-05-05 NOTE — Telephone Encounter (Signed)
LFTs are normal

## 2020-05-05 NOTE — Telephone Encounter (Signed)
Pt is calling about her lab results from 11/06.  These are due to her being on OFEV.  TP  please advise. Thanks

## 2020-05-08 DIAGNOSIS — F329 Major depressive disorder, single episode, unspecified: Secondary | ICD-10-CM | POA: Diagnosis not present

## 2020-05-08 DIAGNOSIS — Z1331 Encounter for screening for depression: Secondary | ICD-10-CM | POA: Diagnosis not present

## 2020-05-08 DIAGNOSIS — E039 Hypothyroidism, unspecified: Secondary | ICD-10-CM | POA: Diagnosis not present

## 2020-05-08 DIAGNOSIS — Z6823 Body mass index (BMI) 23.0-23.9, adult: Secondary | ICD-10-CM | POA: Diagnosis not present

## 2020-05-08 DIAGNOSIS — I10 Essential (primary) hypertension: Secondary | ICD-10-CM | POA: Diagnosis not present

## 2020-05-08 DIAGNOSIS — Z1389 Encounter for screening for other disorder: Secondary | ICD-10-CM | POA: Diagnosis not present

## 2020-05-21 ENCOUNTER — Other Ambulatory Visit: Payer: Self-pay

## 2020-05-21 ENCOUNTER — Ambulatory Visit (INDEPENDENT_AMBULATORY_CARE_PROVIDER_SITE_OTHER): Payer: Medicare Other | Admitting: Pulmonary Disease

## 2020-05-21 ENCOUNTER — Encounter: Payer: Self-pay | Admitting: Pulmonary Disease

## 2020-05-21 VITALS — BP 120/80 | HR 77 | Temp 98.3°F | Ht 65.0 in | Wt 140.8 lb

## 2020-05-21 DIAGNOSIS — J849 Interstitial pulmonary disease, unspecified: Secondary | ICD-10-CM | POA: Diagnosis not present

## 2020-05-21 DIAGNOSIS — Z5181 Encounter for therapeutic drug level monitoring: Secondary | ICD-10-CM | POA: Diagnosis not present

## 2020-05-21 NOTE — Patient Instructions (Addendum)
Check LFTs in feb 2022 6 min walk test  HRCT chest in may 2022

## 2020-05-21 NOTE — Progress Notes (Signed)
   Subjective:    Patient ID: Debra Jimenez, female    DOB: [birth year], 69 y.o.   MRN: [id number]  HPI  69 year old remote smoker for follow-up of ILD, symptom onset since 2019 -New onset hypertension on lisinopril  She had 10% drop in FVC from 20 19-20 21, however TLC and DLCO are stable compared to 2019 >> progressive phenotype.  Developed significant nausea with Ofev and this was decreased 200 mg twice daily which she is tolerating better She still feels short of breath while walking uphill but is able to walk about a mile with her husband at slow pace. Reports a dry cough. Immunizations are up-to-date  Significant tests/ events reviewed  HRCT 10/2019 "probable" UIP,basilar predominance, early honeycombing ,mild mediastinal lymphadenopathy  PFTs 12/2019 -FVC 1.47/46%, TLC 3.49/67%, DLCO 12.2/60%  PFTs 12/2017 -[facility]-moderate intraparenchymal restriction, ratio 88, FVC 56%/1.70, DLCO 8.7/44%, TLC 3.21/64% Chest x-ray 3/29-bilateral interstitial changes  On ambulation, heart rate increased from 90-1 08 and oxygen saturation dropped from 97% to 91%  Review of Systems neg for any significant sore throat, dysphagia, itching, sneezing, nasal congestion or excess/ purulent secretions, fever, chills, sweats, unintended wt loss, pleuritic or exertional cp, hempoptysis, orthopnea pnd or change in chronic leg swelling. Also denies presyncope, palpitations, heartburn, abdominal pain, nausea, vomiting, diarrhea or change in bowel or urinary habits, dysuria,hematuria, rash, arthralgias, visual complaints, headache, numbness weakness or ataxia.      Objective:   Physical Exam  Gen. Pleasant, well-nourished, in no distress ENT - no thrush, no pallor/icterus,no post nasal drip Neck: No JVD, no thyromegaly, no carotid bruits Lungs: no use of accessory muscles, no dullness to percussion, bibasal 1/3 rales no rhonchi  Cardiovascular: Rhythm regular, heart sounds  normal, no murmurs  or gallops, no peripheral edema Musculoskeletal: No deformities, no cyanosis or clubbing        Assessment & Plan:    Therapeutic drug monitoring -check LFTs 3 monthly now, last LFTs were normal

## 2020-05-22 NOTE — Assessment & Plan Note (Addendum)
This is probable UIP and biopsy not considered necessary Check LFTs in feb 2022 6 min walk test  HRCT chest in may 2022  She is tolerating Ofev 100 mg.  At her next visit we will consider increasing to 150 mg twice daily We had an excellent discussion about disease course and signs of progression

## 2020-05-27 ENCOUNTER — Telehealth: Payer: Self-pay | Admitting: Pulmonary Disease

## 2020-05-27 DIAGNOSIS — J849 Interstitial pulmonary disease, unspecified: Secondary | ICD-10-CM

## 2020-05-27 MED ORDER — OFEV 100 MG PO CAPS: 100.0000 mg | ORAL_CAPSULE | Freq: Two times a day (BID) | ORAL | 11 refills | Status: DC

## 2020-05-27 NOTE — Telephone Encounter (Signed)
Rx for pt's OFEV has been sent to [facility] pharmacy [facility] for pt. Called and spoke with pt letting her know this had been done and she verbalized understanding. Nothing further needed.

## 2020-06-02 ENCOUNTER — Telehealth: Payer: Self-pay | Admitting: Pulmonary Disease

## 2020-06-02 NOTE — Telephone Encounter (Signed)
Rachael can you please advise on this for patient's OFEV.  pt got letter today stating that they could not process paperwork states they need more information for her Durel Salts - states that patient assistance did not receive our paperwork - Patient states that she was told that she would not have to redo everything since she started medication in July   please call pt [phone]

## 2020-06-03 NOTE — Telephone Encounter (Signed)
Called patient, she was not home and did not have letter to advise.  Called [employer] to check status of what was missing from patient's renewal application. Per rep Amil Amen, they are missing the patient's portion of the application- pages 1-3.  Printed completed application that was scanned into patient's chart and faxed to program. Nothing further needed.

## 2020-06-30 DIAGNOSIS — E039 Hypothyroidism, unspecified: Secondary | ICD-10-CM | POA: Diagnosis not present

## 2020-08-05 ENCOUNTER — Other Ambulatory Visit (HOSPITAL_COMMUNITY)
Admission: RE | Admit: 2020-08-05 | Discharge: 2020-08-05 | Disposition: A | Discharge status: Home / Self Care | Payer: Medicare Other | Source: Ambulatory Visit | Attending: Pulmonary Disease | Admitting: Pulmonary Disease

## 2020-08-05 ENCOUNTER — Other Ambulatory Visit: Payer: Self-pay

## 2020-08-05 DIAGNOSIS — Z5181 Encounter for therapeutic drug level monitoring: Secondary | ICD-10-CM | POA: Insufficient documentation

## 2020-08-05 LAB — HEPATIC FUNCTION PANEL
ALT: 16 U/L (ref 0–44)
AST: 19 U/L (ref 15–41)
Albumin: 4 g/dL (ref 3.5–5.0)
Alkaline Phosphatase: 60 U/L (ref 38–126)
Bilirubin, Direct: 0.1 mg/dL (ref 0.0–0.2)
Indirect Bilirubin: 0.7 mg/dL (ref 0.3–0.9)
Total Bilirubin: 0.8 mg/dL (ref 0.3–1.2)
Total Protein: 7.1 g/dL (ref 6.5–8.1)

## 2020-08-07 NOTE — Progress Notes (Signed)
Called and left message on voicemail to please return phone call to go over results and schedule next office visit. Contact number provided.

## 2020-08-07 NOTE — Progress Notes (Signed)
Patient returned phone call and writer went over lab results per Dr Vassie Loll with patient. All questions answered and patient expressed full understanding. Scheduled office visit for Monday 09/28/2020 at 10:45am at the [place] office. Patient agreeable to time, date and location.

## 2020-08-13 ENCOUNTER — Ambulatory Visit (INDEPENDENT_AMBULATORY_CARE_PROVIDER_SITE_OTHER): Payer: Medicare Other

## 2020-08-13 DIAGNOSIS — J849 Interstitial pulmonary disease, unspecified: Secondary | ICD-10-CM

## 2020-08-13 NOTE — Progress Notes (Signed)
Six Minute Walk - 08/13/20 1146      Six Minute Walk   Medications taken before test (dose and time) levothyroxine at 7:30 and OFEV 100mg  at 9:45    Supplemental oxygen during test? No    Lap distance in meters  34 meters    Laps Completed 14    Partial lap (in meters) 0 meters    Baseline BP (sitting) 120/70    Baseline Heartrate 66    Baseline Dyspnea (Borg Scale) 1    Baseline Fatigue (Borg Scale) 0    Baseline SPO2 97 %      End of Test Values    BP (sitting) 150/100    Heartrate 102    Dyspnea (Borg Scale) 4    Fatigue (Borg Scale) 3    SPO2 87 %      2 Minutes Post Walk Values   BP (sitting) 130/90    Heartrate 89    SPO2 96 %    Stopped or paused before six minutes? No      Interpretation   Distance completed 476 meters    Tech Comments: Pt walked at an average pace completing all 6 min without stopping. Pt's O2 sats did drop to 87% after 6 min was up but pt did quickly recover back up to 96% after 1 min. Pt declines being started on O2 at this time.

## 2020-09-28 ENCOUNTER — Ambulatory Visit (INDEPENDENT_AMBULATORY_CARE_PROVIDER_SITE_OTHER): Payer: Medicare Other | Admitting: Pulmonary Disease

## 2020-09-28 ENCOUNTER — Encounter: Payer: Self-pay | Admitting: Pulmonary Disease

## 2020-09-28 ENCOUNTER — Other Ambulatory Visit (HOSPITAL_COMMUNITY)
Admission: RE | Admit: 2020-09-28 | Discharge: 2020-09-28 | Disposition: A | Discharge status: Home / Self Care | Payer: Medicare Other | Source: Ambulatory Visit | Attending: Pulmonary Disease | Admitting: Pulmonary Disease

## 2020-09-28 ENCOUNTER — Other Ambulatory Visit: Payer: Self-pay

## 2020-09-28 VITALS — BP 132/92 | HR 84 | Temp 97.0°F | Ht 65.0 in | Wt 139.8 lb

## 2020-09-28 DIAGNOSIS — I10 Essential (primary) hypertension: Secondary | ICD-10-CM | POA: Diagnosis not present

## 2020-09-28 DIAGNOSIS — J849 Interstitial pulmonary disease, unspecified: Secondary | ICD-10-CM

## 2020-09-28 DIAGNOSIS — Z5181 Encounter for therapeutic drug level monitoring: Secondary | ICD-10-CM

## 2020-09-28 LAB — HEPATIC FUNCTION PANEL
ALT: 18 U/L (ref 0–44)
AST: 21 U/L (ref 15–41)
Albumin: 4.2 g/dL (ref 3.5–5.0)
Alkaline Phosphatase: 62 U/L (ref 38–126)
Bilirubin, Direct: 0.1 mg/dL (ref 0.0–0.2)
Indirect Bilirubin: 0.7 mg/dL (ref 0.3–0.9)
Total Bilirubin: 0.8 mg/dL (ref 0.3–1.2)
Total Protein: 7.5 g/dL (ref 6.5–8.1)

## 2020-09-28 MED ORDER — ONDANSETRON HCL 4 MG PO TABS: 4.0000 mg | ORAL_TABLET | Freq: Two times a day (BID) | ORAL | 1 refills | Status: DC | PRN

## 2020-09-28 NOTE — Patient Instructions (Signed)
Check LFTs Rx for zofran 4 mg oral bid prn # 30 x  1 refill  Schedule HRCT may 2022  Spiro/ DLCO in July 2022  We discussed alternative

## 2020-09-28 NOTE — Assessment & Plan Note (Signed)
Unclear why blood pressure measurement is variable on her machine versus ours.  I have asked her to keep a blood pressure log and share with her PCP, she will need continued elevated blood pressure monitoring, compliant with her losartan

## 2020-09-28 NOTE — Progress Notes (Signed)
Called and went over lab results per Dr Alva with patient. All questions answered and patient expressed full understanding. Nothing further needed at this time.

## 2020-09-28 NOTE — Progress Notes (Signed)
   Subjective:    Patient ID: Debra Jimenez, female    DOB: [birth year], 70 y.o.   MRN: [id number]  HPI  70 yo remote smoker for follow-up of ILD, symptom onset since 2019 -New onset hypertension on lisinopril  She had 10% drop in FVC from 20 19-20 21,however TLC and DLCO are stable compared to 2019 >> progressive phenotype.  Developed significant nausea with Ofev and this was decreased  To 100 mg twice daily   Chief Complaint  Patient presents with  . Follow-up    Nausea for last couple of weeks   Accompanied by her husband, they travel to [place] for the birth of their granddaughter.  Dyspnea is at her baseline.  She complains of a lot of anxiety. Blood pressure varies and seems to be high when she takes it at her own home machine, today's blood pressure was 132/92 on our machine and 1 70/110 on hers. She is still having considerable nausea, she also has loose stools after taking Ofev and very often will skip the morning dose She request medication for nausea  Significant tests/ events reviewed  HRCT 10/2019 "probable" UIP,basilar predominance, early honeycombing ,mild mediastinal lymphadenopathy  PFTs 12/2019-FVC 1.47/46%, TLC 3.49/67%, DLCO 12.2/60%  PFTs7/2019-[facility]-moderate intraparenchymal restriction, ratio 88, FVC 56%/1.70, DLCO 8.7/44%, TLC 3.21/64% Chest x-ray 3/29-bilateral interstitial changes  On ambulation, heart rate increased from 90-1 08 and oxygen saturation dropped from 97% to 91%  Review of Systems neg for any significant sore throat, dysphagia, itching, sneezing, nasal congestion or excess/ purulent secretions, fever, chills, sweats, unintended wt loss, pleuritic or exertional cp, hempoptysis, orthopnea pnd or change in chronic leg swelling. Also denies presyncope, palpitations, heartburn, abdominal pain, nausea, vomiting, diarrhea or change in bowel or urinary habits, dysuria,hematuria, rash, arthralgias, visual complaints, headache,  numbness weakness or ataxia.     Objective:   Physical Exam  Gen. Pleasant, well-nourished, in no distress ENT - no thrush, no pallor/icterus,no post nasal drip Neck: No JVD, no thyromegaly, no carotid bruits Lungs: no use of accessory muscles, no dullness to percussion, bibasal rales no rhonchi  Cardiovascular: Rhythm regular, heart sounds  normal, no murmurs or gallops, no peripheral edema Musculoskeletal: No deformities, no cyanosis or clubbing        Assessment & Plan:   Therapeutic drug monitoring Adverse effect of Ofev  -We will use Zofran for nausea, check LFTs, if if nausea persists we may have to consider changing to esbriet

## 2020-09-28 NOTE — Assessment & Plan Note (Signed)
We discussed alternative medications Ofev which is Esbriet and side effects of that medication. We will reassess ILD with HRCT in May and repeat PFTs in July

## 2020-10-24 IMAGING — CT CT CHEST HIGH RESOLUTION W/O CM
2 of 5 series · 15 of 36 positions shown, 18 images · non-contrast
Comparison: 09/16/2019 chest radiograph.

CLINICAL DATA: Chronic dyspnea. Bibasilar crackles on exam.
Desaturation with ambulation. Restrictive lung disease on pulmonary
function tests.

EXAM:
CT CHEST WITHOUT CONTRAST
TECHNIQUE: Multidetector CT imaging of the chest was performed following the
standard protocol without intravenous contrast. High resolution
imaging of the lungs, as well as inspiratory and expiratory imaging,
was performed.

[Series 4: standard chest · axial · 0.66mm/px · z∈[+1294,+1558]mm · 12 of 146 slices shown, 15 images]
[im 7/146  mediastinal]
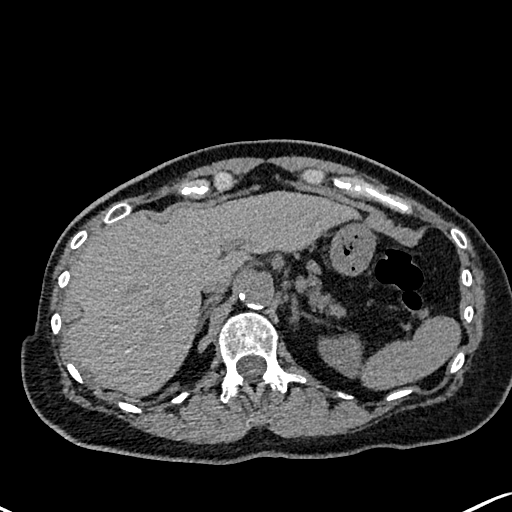
[im 7/146  lung]
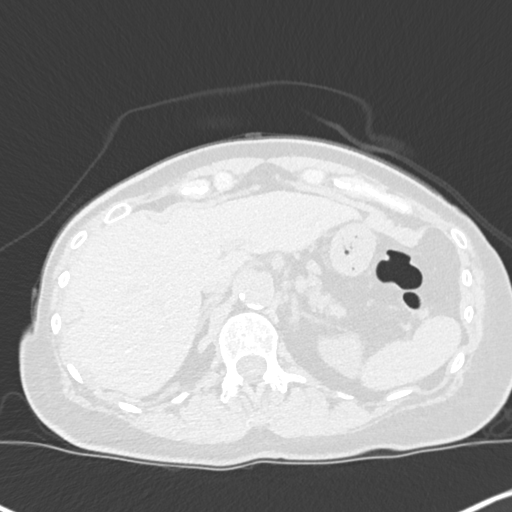
[im 19/146  lung]
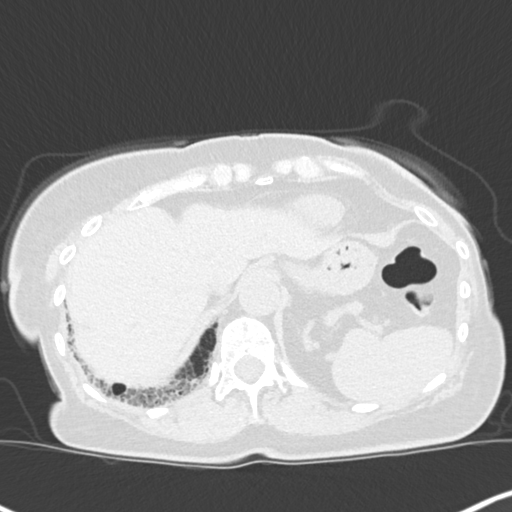
[im 32/146  lung]
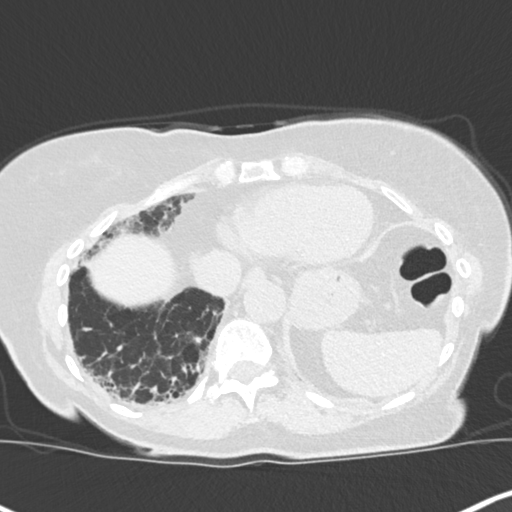
[im 45/146  lung]
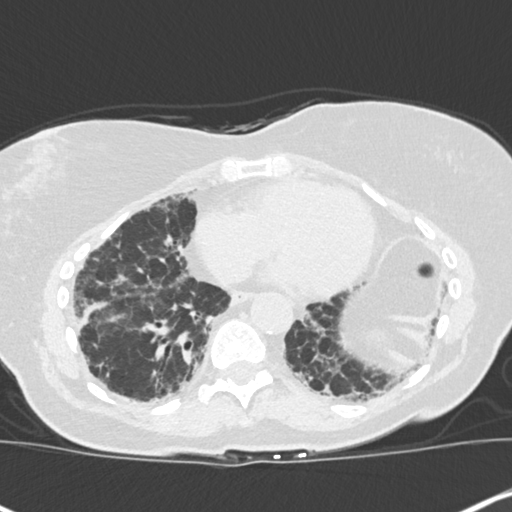
[im 57/146  mediastinal]
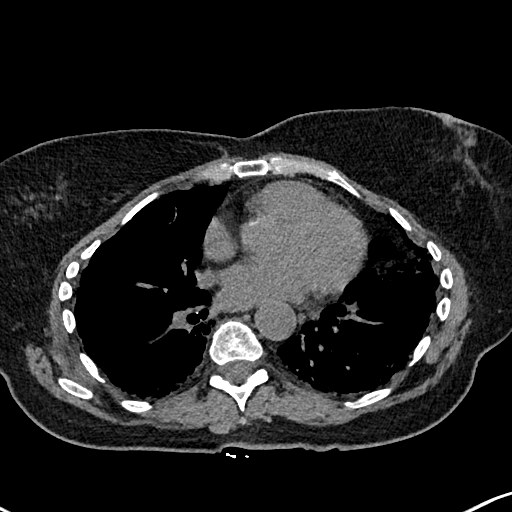
[im 57/146  lung]
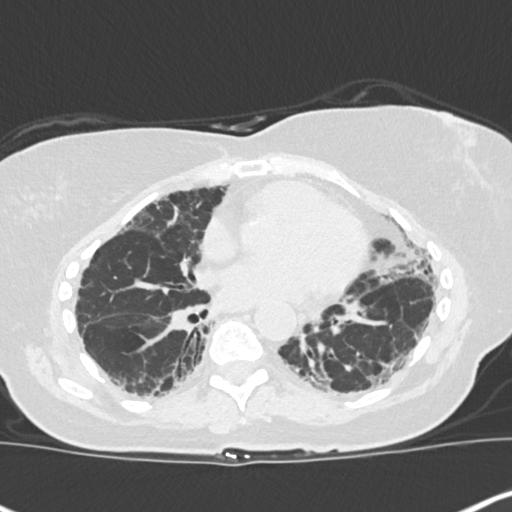
[im 70/146  lung]
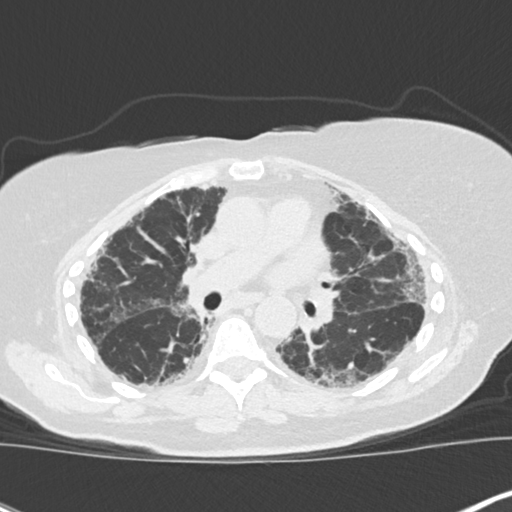
[im 76/146  lung]
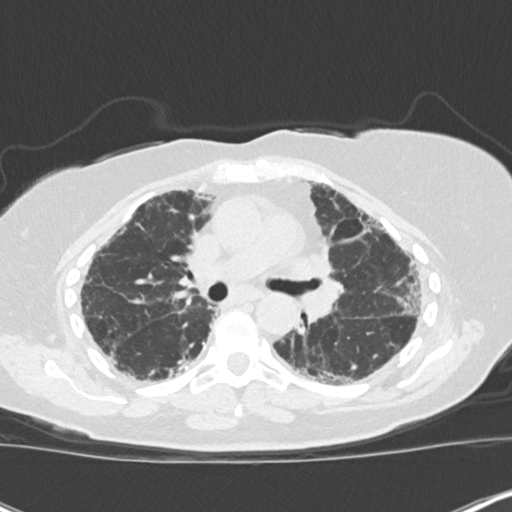
[im 89/146  lung]
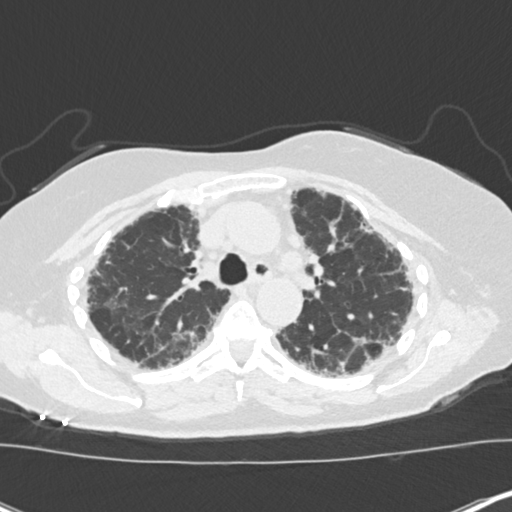
[im 101/146  mediastinal]
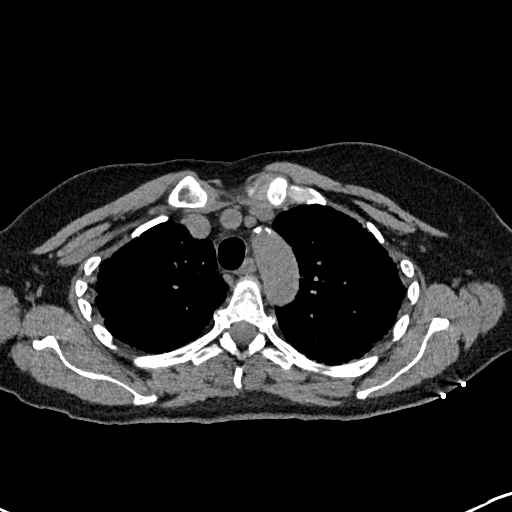
[im 101/146  lung]
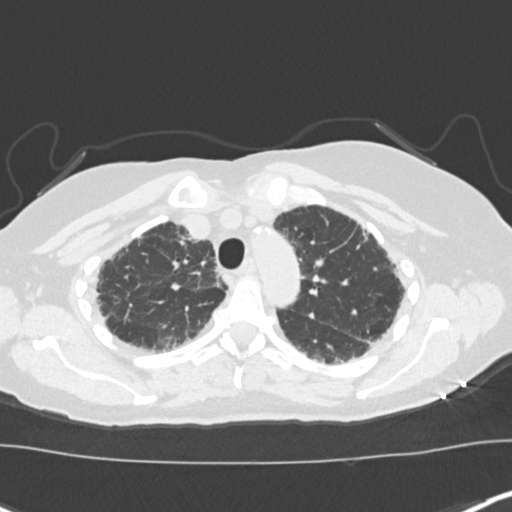
[im 114/146  lung]
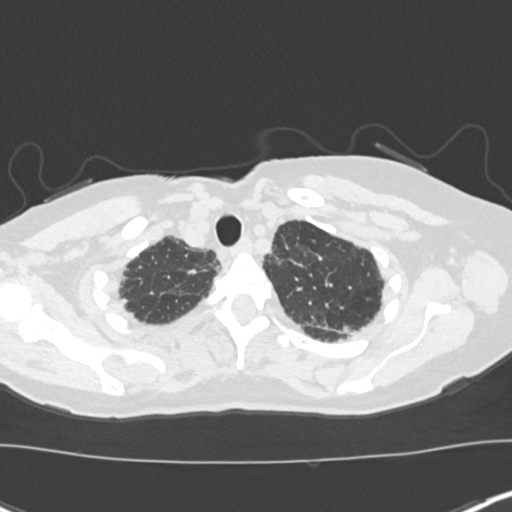
[im 127/146  lung]
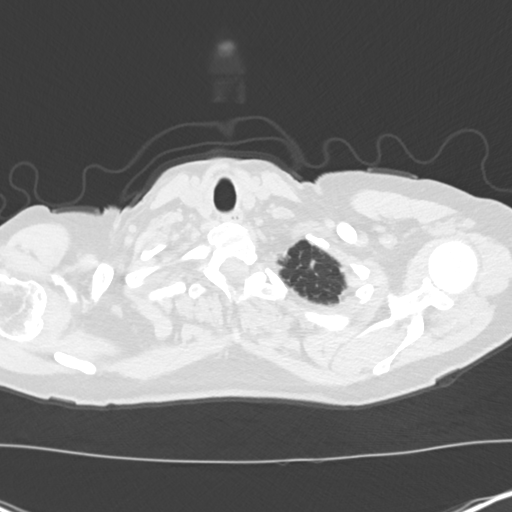
[im 139/146  lung]
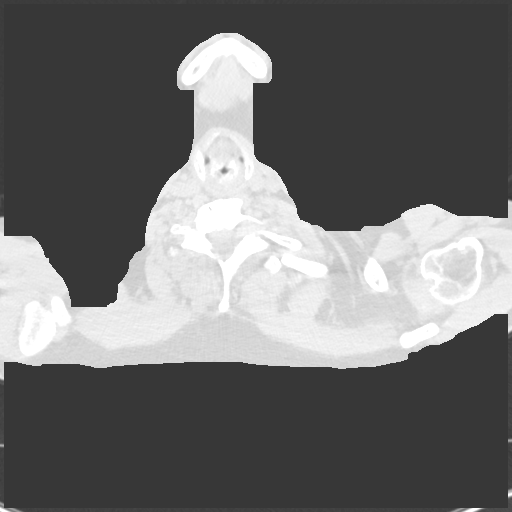

[Series 8: coronal · coronal · 0.61mm/px · 3 of 110 slices shown]
[im 22/110  lung]
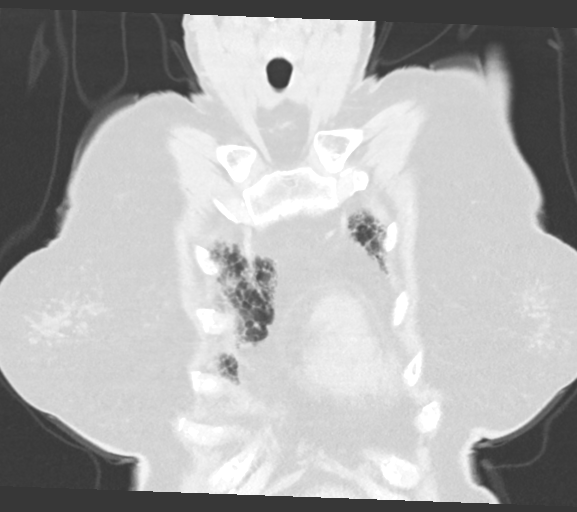
[im 44/110  lung]
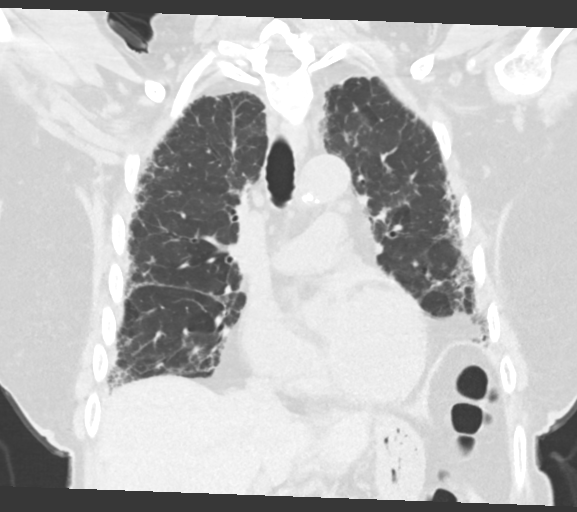
[im 66/110  lung]
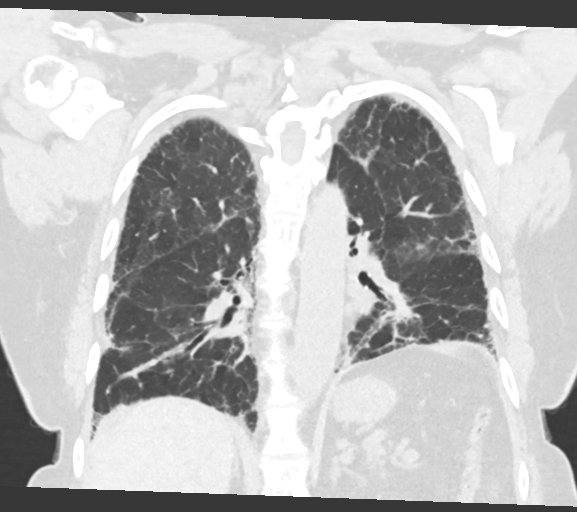

[15 of 36 positions shown; findings below may reference images not displayed]

FINDINGS: Cardiovascular: Normal heart size. No significant pericardial
effusion/thickening. Left anterior descending coronary
atherosclerosis. Atherosclerotic nonaneurysmal thoracic aorta.
Normal caliber pulmonary arteries.

Mediastinum/Nodes: Thyroid is either atrophic or surgically absent.
Unremarkable esophagus. No axillary adenopathy. Mildly enlarged
cm AP window node (series 4/image 57). No additional pathologically
enlarged mediastinal nodes. No discrete hilar adenopathy on this
noncontrast scan.

Lungs/Pleura: No acute consolidative airspace disease, lung masses
or significant pulmonary nodules. No pneumothorax. No pleural
effusion. No significant regions of lobular air trapping or evidence
of tracheobronchomalacia on the expiration sequence. There is
extensive patchy subpleural reticulation and ground-glass opacity
throughout both lungs with associated moderate traction
bronchiectasis, architectural distortion and volume loss. Suggestion
of minimal early honeycombing at the lung bases bilaterally. Slight
basilar predominance to these findings.

Upper abdomen: No acute abnormality.

Musculoskeletal: No aggressive appearing focal osseous lesions.
Minimal thoracic spondylosis.
IMPRESSION: 1. Spectrum of findings compatible with fibrotic interstitial lung
disease with slight basilar predominance and early honeycombing.
Findings are categorized as probable UIP per consensus guidelines:
Diagnosis of Idiopathic Pulmonary Fibrosis: An Official
ATS/ERS/JRS/ALAT Clinical Practice Guideline. Am J Respir Crit Care
Med Vol 198, Nicklaas 5, [id number][DATE].
2. Mild mediastinal lymphadenopathy, nonspecific, most likely
reactive.
3. One vessel coronary atherosclerosis.
4. Aortic Atherosclerosis ([id number]).

## 2020-11-05 ENCOUNTER — Other Ambulatory Visit: Payer: Self-pay

## 2020-11-05 ENCOUNTER — Ambulatory Visit (HOSPITAL_COMMUNITY)
Admission: RE | Admit: 2020-11-05 | Discharge: 2020-11-05 | Disposition: A | Discharge status: Home / Self Care | Payer: Medicare Other | Source: Ambulatory Visit | Attending: Pulmonary Disease | Admitting: Pulmonary Disease

## 2020-11-05 DIAGNOSIS — J849 Interstitial pulmonary disease, unspecified: Secondary | ICD-10-CM | POA: Diagnosis not present

## 2020-11-05 DIAGNOSIS — I700 Atherosclerosis of aorta: Secondary | ICD-10-CM | POA: Diagnosis not present

## 2020-11-05 DIAGNOSIS — J84112 Idiopathic pulmonary fibrosis: Secondary | ICD-10-CM | POA: Diagnosis not present

## 2020-11-05 DIAGNOSIS — J479 Bronchiectasis, uncomplicated: Secondary | ICD-10-CM | POA: Diagnosis not present

## 2020-11-05 DIAGNOSIS — I2510 Atherosclerotic heart disease of native coronary artery without angina pectoris: Secondary | ICD-10-CM | POA: Diagnosis not present

## 2020-11-12 NOTE — Progress Notes (Signed)
Called and left message on voicemail to please return phone call to go over CT results. Contact number provided.

## 2020-11-13 NOTE — Addendum Note (Signed)
Addended by: Melonie Florida on: 11/13/2020 01:16 PM   Modules accepted: Orders

## 2020-11-13 NOTE — Progress Notes (Signed)
Patient returned phone call, name and birth date confirmed. Went over CT results per Dr Vassie Loll with patient. All questions answered and patient expressed full understanding of results and Dr Reginia Naas recommendations. Nothing further needed at this time.

## 2020-12-10 ENCOUNTER — Telehealth: Payer: Self-pay | Admitting: Pulmonary Disease

## 2020-12-10 NOTE — Telephone Encounter (Signed)
Pt states that someone from [facility] has called her but wont be able to schedule her for a PFT until the middle of August although [name] wanted to see her back in early August and she is just not sure as to what she should do and wanted to speak with a nurse. Pls regard; [phone]

## 2020-12-10 NOTE — Telephone Encounter (Signed)
I called and spoke with the pt and gave her number to call [facility] to have her PFT scheduled. [phone]. Nothing further needed.

## 2020-12-10 NOTE — Telephone Encounter (Signed)
Spoke with the pt  Offered sooner PFT in [place]- she refused  She feels stable and I advised ok to wait until next opening in [place] for the f/u at PFT  I scheduled her for 02/23/21 and she will call out [facility] to have them set up her PFT for August  Nothing further needed

## 2021-02-05 ENCOUNTER — Other Ambulatory Visit: Payer: Self-pay

## 2021-02-05 ENCOUNTER — Other Ambulatory Visit (HOSPITAL_COMMUNITY)
Admission: RE | Admit: 2021-02-05 | Discharge: 2021-02-05 | Disposition: A | Discharge status: Home / Self Care | Payer: Medicare Other | Source: Ambulatory Visit | Attending: Pulmonary Disease | Admitting: Pulmonary Disease

## 2021-02-08 ENCOUNTER — Other Ambulatory Visit (HOSPITAL_COMMUNITY)
Admission: RE | Admit: 2021-02-08 | Discharge: 2021-02-08 | Disposition: A | Discharge status: Home / Self Care | Payer: Medicare Other | Source: Ambulatory Visit | Attending: Pulmonary Disease | Admitting: Pulmonary Disease

## 2021-02-08 ENCOUNTER — Other Ambulatory Visit: Payer: Self-pay

## 2021-02-08 DIAGNOSIS — Z20822 Contact with and (suspected) exposure to covid-19: Secondary | ICD-10-CM | POA: Diagnosis not present

## 2021-02-08 LAB — SARS CORONAVIRUS 2 BY RT PCR (HOSPITAL ORDER, PERFORMED IN ~~LOC~~ HOSPITAL LAB): SARS Coronavirus 2: NEGATIVE

## 2021-02-09 ENCOUNTER — Ambulatory Visit (HOSPITAL_COMMUNITY): Admission: RE | Admit: 2021-02-09 | Payer: Medicare Other | Source: Ambulatory Visit

## 2021-02-15 ENCOUNTER — Other Ambulatory Visit (HOSPITAL_COMMUNITY)
Admission: RE | Admit: 2021-02-15 | Discharge: 2021-02-15 | Disposition: A | Discharge status: Home / Self Care | Payer: Medicare Other | Source: Ambulatory Visit | Attending: Pulmonary Disease | Admitting: Pulmonary Disease

## 2021-02-15 ENCOUNTER — Other Ambulatory Visit: Payer: Self-pay

## 2021-02-15 DIAGNOSIS — Z20822 Contact with and (suspected) exposure to covid-19: Secondary | ICD-10-CM | POA: Insufficient documentation

## 2021-02-15 DIAGNOSIS — Z01812 Encounter for preprocedural laboratory examination: Secondary | ICD-10-CM | POA: Insufficient documentation

## 2021-02-16 LAB — SARS CORONAVIRUS 2 (TAT 6-24 HRS): SARS Coronavirus 2: NEGATIVE

## 2021-02-17 ENCOUNTER — Encounter (HOSPITAL_COMMUNITY): Payer: Medicare Other

## 2021-02-19 ENCOUNTER — Other Ambulatory Visit: Payer: Self-pay

## 2021-02-19 ENCOUNTER — Ambulatory Visit (HOSPITAL_COMMUNITY)
Admission: RE | Admit: 2021-02-19 | Discharge: 2021-02-19 | Disposition: A | Discharge status: Home / Self Care | Payer: Medicare Other | Source: Ambulatory Visit | Attending: Pulmonary Disease | Admitting: Pulmonary Disease

## 2021-02-19 DIAGNOSIS — J849 Interstitial pulmonary disease, unspecified: Secondary | ICD-10-CM | POA: Diagnosis not present

## 2021-02-19 LAB — PULMONARY FUNCTION TEST
DL/VA % pred: 116 %
DL/VA: 4.79 ml/min/mmHg/L
DLCO unc % pred: 38 %
DLCO unc: 7.83 ml/min/mmHg
FEF 25-75 Post: 1.41 L/sec
FEF 25-75 Pre: 1.46 L/sec
FEF2575-%Change-Post: -3 %
FEF2575-%Pred-Post: 72 %
FEF2575-%Pred-Pre: 74 %
FEV1-%Change-Post: -1 %
FEV1-%Pred-Post: 46 %
FEV1-%Pred-Pre: 47 %
FEV1-Post: 1.11 L
FEV1-Pre: 1.13 L
FEV1FVC-%Change-Post: 2 %
FEV1FVC-%Pred-Pre: 117 %
FEV6-%Change-Post: -4 %
FEV6-%Pred-Post: 40 %
FEV6-%Pred-Pre: 42 %
FEV6-Post: 1.21 L
FEV6-Pre: 1.26 L
FEV6FVC-%Pred-Post: 104 %
FEV6FVC-%Pred-Pre: 104 %
FVC-%Change-Post: -4 %
FVC-%Pred-Post: 38 %
FVC-%Pred-Pre: 40 %
FVC-Post: 1.21 L
FVC-Pre: 1.26 L
Post FEV1/FVC ratio: 92 %
Post FEV6/FVC ratio: 100 %
Pre FEV1/FVC ratio: 89 %
Pre FEV6/FVC Ratio: 100 %
RV % pred: 102 %
RV: 2.29 L
TLC % pred: 66 %
TLC: 3.45 L

## 2021-02-19 MED ORDER — ALBUTEROL SULFATE (2.5 MG/3ML) 0.083% IN NEBU
2.5000 mg | INHALATION_SOLUTION | Freq: Once | RESPIRATORY_TRACT | Status: AC
Start: 2021-02-19 — End: 2021-02-19
  Administered 2021-02-19: 2.5 mg via RESPIRATORY_TRACT

## 2021-02-23 ENCOUNTER — Ambulatory Visit (INDEPENDENT_AMBULATORY_CARE_PROVIDER_SITE_OTHER): Payer: Medicare Other | Admitting: Pulmonary Disease

## 2021-02-23 ENCOUNTER — Encounter: Payer: Self-pay | Admitting: Pulmonary Disease

## 2021-02-23 ENCOUNTER — Other Ambulatory Visit: Payer: Self-pay

## 2021-02-23 VITALS — BP 126/78 | HR 66 | Temp 97.7°F | Ht 65.0 in | Wt 138.0 lb

## 2021-02-23 DIAGNOSIS — J849 Interstitial pulmonary disease, unspecified: Secondary | ICD-10-CM

## 2021-02-23 NOTE — Progress Notes (Signed)
   Subjective:    Patient ID: Debra Jimenez, female    DOB: Oct 17, 1950, 70 y.o.   MRN: [id number]  HPI  70 yo remote smoker for follow-up of ILD, symptom onset since 2019 -New onset hypertension on lisinopril   She had 10% drop in FVC from 20 19-20 21, however TLC and DLCO are stable compared to 2019 >> progressive phenotype.  Developed significant nausea with Ofev and this was decreased  to 100 mg twice daily    Chief Complaint  Patient presents with   Follow-up    Notices increased SOB when outside doing activities such as yard work. Rare SOB when out running errands at her moderate/slow pace. No coughing to report.     29-month follow-up visit Complains of increasing shortness of breath especially if she is working outside, this improves after resting, cough is not an issue. Still has some queasiness after taking Ofev reduced dose 100 mg We reviewed CT scan and PFTs today On ambulation surprisingly oxygen saturation stayed at 95%, heart rate increased from 78-1 23 She had an aunt in [place] who was diagnosed with CT ILD  Significant tests/ events reviewed  HRCT 10/2020 >> c/w UIP, increased compared to 2021, unchanged prominent LNs   HRCT 10/2019 "probable" UIP , basilar predominance, early honeycombing , mild mediastinal lymphadenopathy   PFTs 02/2021 -FVC 1.26/40%, TLC 3.45/66%, DLCO 7.83/38%  PFTs 12/2019 -FVC 1.47/46%, TLC 3.49/67%, DLCO 12.2/60%   PFTs 12/2017  -[facility] -moderate intraparenchymal restriction, ratio 88, FVC 56%/1.70, DLCO 8.7/44%, TLC 3.21/64% Chest x-ray 3/29-bilateral interstitial changes    Review of Systems neg for any significant sore throat, dysphagia, itching, sneezing, nasal congestion or excess/ purulent secretions, fever, chills, sweats, unintended wt loss, pleuritic or exertional cp, hempoptysis, orthopnea pnd or change in chronic leg swelling. Also denies presyncope, palpitations, heartburn, abdominal pain, nausea, vomiting, diarrhea or  change in bowel or urinary habits, dysuria,hematuria, rash, arthralgias, visual complaints, headache, numbness weakness or ataxia.     Objective:   Physical Exam  Gen. Pleasant, well-nourished, in no distress ENT - no thrush, no pallor/icterus,no post nasal drip Neck: No JVD, no thyromegaly, no carotid bruits Lungs: no use of accessory muscles, no dullness to percussion, bibasal fine rales, no rhonchi Cardiovascular: Rhythm regular, heart sounds  normal, no murmurs or gallops, no peripheral edema Musculoskeletal: No deformities, no cyanosis or clubbing        Assessment & Plan:

## 2021-02-23 NOTE — Patient Instructions (Signed)
Ambulatory sat Referral to pulmonary rehab program Continue on ofev

## 2021-02-23 NOTE — Assessment & Plan Note (Addendum)
HRCT is now consistent with UIP suggesting that this is IPF.  She does have progression with decline in FVC of more than 5% suggesting that this is a progressive phenotype.  Unfortunately she has not tolerated full dose of Ofev and we have had to go with reduced dose.  We discussed options today including -Increasing Ofev to full dose of 150 -Trying alternative medication, pirfenidone which would have side effects of photosensitivity and nausea with increased pill burden -Adding pirfenidone to current dose of Ofev, no data that this would work and would certainly compound side effects At this point they would like to minimize side effects Check LFTs today  Referral to pulmonary rehab

## 2021-03-08 DIAGNOSIS — Z23 Encounter for immunization: Secondary | ICD-10-CM | POA: Diagnosis not present

## 2021-03-16 DIAGNOSIS — Z23 Encounter for immunization: Secondary | ICD-10-CM | POA: Diagnosis not present

## 2021-03-16 DIAGNOSIS — T466X5A Adverse effect of antihyperlipidemic and antiarteriosclerotic drugs, initial encounter: Secondary | ICD-10-CM | POA: Diagnosis not present

## 2021-03-16 DIAGNOSIS — E039 Hypothyroidism, unspecified: Secondary | ICD-10-CM | POA: Diagnosis not present

## 2021-03-16 DIAGNOSIS — E785 Hyperlipidemia, unspecified: Secondary | ICD-10-CM | POA: Diagnosis not present

## 2021-03-16 DIAGNOSIS — F419 Anxiety disorder, unspecified: Secondary | ICD-10-CM | POA: Diagnosis not present

## 2021-03-16 DIAGNOSIS — R7303 Prediabetes: Secondary | ICD-10-CM | POA: Diagnosis not present

## 2021-03-16 DIAGNOSIS — I10 Essential (primary) hypertension: Secondary | ICD-10-CM | POA: Diagnosis not present

## 2021-03-16 DIAGNOSIS — E559 Vitamin D deficiency, unspecified: Secondary | ICD-10-CM | POA: Diagnosis not present

## 2021-03-16 DIAGNOSIS — M791 Myalgia, unspecified site: Secondary | ICD-10-CM | POA: Diagnosis not present

## 2021-03-22 ENCOUNTER — Other Ambulatory Visit: Payer: Self-pay

## 2021-03-22 ENCOUNTER — Encounter (HOSPITAL_COMMUNITY)
Admission: RE | Admit: 2021-03-22 | Discharge: 2021-03-22 | Disposition: A | Discharge status: Home / Self Care | Payer: Medicare Other | Source: Ambulatory Visit | Attending: Pulmonary Disease | Admitting: Pulmonary Disease

## 2021-03-22 ENCOUNTER — Encounter (HOSPITAL_COMMUNITY): Payer: Self-pay

## 2021-03-22 VITALS — BP 124/68 | HR 89 | Ht 65.0 in | Wt 137.1 lb

## 2021-03-22 DIAGNOSIS — J849 Interstitial pulmonary disease, unspecified: Secondary | ICD-10-CM | POA: Diagnosis not present

## 2021-03-22 NOTE — Progress Notes (Signed)
Pulmonary Individual Treatment Plan  Patient Details  Name: Debra Jimenez MRN: [id number] Date of Birth: [birth year] Referring Provider:   Flowsheet Row PULMONARY REHAB OTHER RESP ORIENTATION from 03/22/2021 in [facility] CARDIAC REHABILITATION  Referring Provider [name]       Initial Encounter Date:  Flowsheet Row PULMONARY REHAB OTHER RESP ORIENTATION from 03/22/2021 in [facility] CARDIAC REHABILITATION  Date 03/22/21       Visit Diagnosis: ILD (interstitial lung disease) (HCC)  Patient's Home Medications on Admission:   Current Outpatient Medications:    ergocalciferol (VITAMIN D2) 1.25 MG (50000 UT) capsule, Take 50,000 Units by mouth once a week. Takes on Sunday, Disp: , Rfl:    albuterol (VENTOLIN HFA) 108 (90 Base) MCG/ACT inhaler, Inhale 1 puff into the lungs as needed., Disp: , Rfl:    ALPRAZolam (XANAX) 0.25 MG tablet, Take 0.25 mg by mouth daily as needed., Disp: , Rfl:    ezetimibe (ZETIA) 10 MG tablet, Take 10 mg by mouth daily., Disp: , Rfl:    levothyroxine (SYNTHROID) 100 MCG tablet, Take 100 mcg by mouth daily before breakfast., Disp: , Rfl:    losartan (COZAAR) 50 MG tablet, Take 50 mg by mouth daily. (Patient not taking: Reported on 03/22/2021), Disp: , Rfl:    Nintedanib (OFEV) 100 MG CAPS, Take 1 capsule (100 mg total) by mouth 2 (two) times daily., Disp: 60 capsule, Rfl: 11   ondansetron (ZOFRAN) 4 MG tablet, Take 1 tablet (4 mg total) by mouth 2 (two) times daily as needed for nausea or vomiting., Disp: 30 tablet, Rfl: 1   zolpidem (AMBIEN) 5 MG tablet, Take 5 mg by mouth at bedtime as needed., Disp: , Rfl:   Past Medical History: Past Medical History:  Diagnosis Date   Hypothyroidism    OA (osteoarthritis)    RIGHT HIP AND LEFT HIP   Wears glasses     Tobacco Use: Social History   Tobacco Use  Smoking Status Former   Packs/day: 0.50   Years: 15.00   Pack years: 7.50   Types: Cigarettes   Quit date: 1985   Years since quitting: 37.7   Smokeless Tobacco Never    Labs: Recent Review Flowsheet Data   There is no flowsheet data to display.     Capillary Blood Glucose: No results found for: GLUCAP   Pulmonary Assessment Scores:  Pulmonary Assessment Scores     Row Name 03/22/21 1248 03/22/21 1249       ADL UCSD   SOB Score total 55 --         CAT Score   CAT Score 28 --         mMRC Score   mMRC Score -- 3            UCSD: Self-administered rating of dyspnea associated with activities of daily living (ADLs) 6-point scale (0 = "not at all" to 5 = "maximal or unable to do because of breathlessness")  Scoring Scores range from 0 to 120.  Minimally important difference is 5 units  CAT: CAT can identify the health impairment of COPD patients and is better correlated with disease progression.  CAT has a scoring range of zero to 40. The CAT score is classified into four groups of low (less than 10), medium (10 - 20), high (21-30) and very high (31-40) based on the impact level of disease on health status. A CAT score over 10 suggests significant symptoms.  A worsening CAT score could be explained by an  exacerbation, poor medication adherence, poor inhaler technique, or progression of COPD or comorbid conditions.  CAT MCID is 2 points  mMRC: mMRC (Modified Medical Research Council) Dyspnea Scale is used to assess the degree of baseline functional disability in patients of respiratory disease due to dyspnea. No minimal important difference is established. A decrease in score of 1 point or greater is considered a positive change.   Pulmonary Function Assessment:   Exercise Target Goals: Exercise Program Goal: Individual exercise prescription set using results from initial 6 min walk test and THRR while considering  patient's activity barriers and safety.   Exercise Prescription Goal: Initial exercise prescription builds to 30-45 minutes a day of aerobic activity, 2-3 days per week.  Home exercise  guidelines will be given to patient during program as part of exercise prescription that the participant will acknowledge.  Activity Barriers & Risk Stratification:  Activity Barriers & Cardiac Risk Stratification - 03/22/21 1246       Activity Barriers & Cardiac Risk Stratification   Activity Barriers Right Hip Replacement;Deconditioning;Shortness of Breath    Cardiac Risk Stratification Low             6 Minute Walk:  6 Minute Walk     Row Name 03/22/21 1414         6 Minute Walk   Phase Initial     Distance 1350 feet     Walk Time 6 minutes     # of Rest Breaks 0     MPH 2.6     METS 3.19     RPE 12     Perceived Dyspnea  15     VO2 Peak 11.16     Symptoms No     Resting HR 89 bpm     Resting BP 124/68     Resting Oxygen Saturation  96 %     Exercise Oxygen Saturation  during 6 min walk 92 %     Max Ex. HR 142 bpm     Max Ex. BP 140/75     2 Minute Post BP 135/70           Interval HR   1 Minute HR 133     2 Minute HR 136     3 Minute HR 141     4 Minute HR 141     5 Minute HR 142     6 Minute HR 143     2 Minute Post HR 103     Interval Heart Rate? Yes           Interval Oxygen   Interval Oxygen? Yes     Baseline Oxygen Saturation % 96 %     1 Minute Oxygen Saturation % 92 %     1 Minute Liters of Oxygen 0 L     2 Minute Oxygen Saturation % 91 %     2 Minute Liters of Oxygen 0 L     3 Minute Oxygen Saturation % 90 %     3 Minute Liters of Oxygen 0 L     4 Minute Oxygen Saturation % 90 %     4 Minute Liters of Oxygen 0 L     5 Minute Oxygen Saturation % 89 %     5 Minute Liters of Oxygen 0 L     6 Minute Oxygen Saturation % 88 %     6 Minute Liters of Oxygen 0 L     2 Minute Post  Oxygen Saturation % 96 %     2 Minute Post Liters of Oxygen 0 L              Oxygen Initial Assessment:  Oxygen Initial Assessment - 03/22/21 1425       Initial 6 min Walk   Oxygen Used None      Program Oxygen Prescription   Program Oxygen Prescription  None      Intervention   Short Term Goals To learn and exhibit compliance with exercise, home and travel O2 prescription;To learn and understand importance of monitoring SPO2 with pulse oximeter and demonstrate accurate use of the pulse oximeter.;To learn and understand importance of maintaining oxygen saturations>88%;To learn and demonstrate proper pursed lip breathing techniques or other breathing techniques. ;To learn and demonstrate proper use of respiratory medications    Long  Term Goals Exhibits compliance with exercise, home  and travel O2 prescription;Verbalizes importance of monitoring SPO2 with pulse oximeter and return demonstration;Maintenance of O2 saturations>88%;Exhibits proper breathing techniques, such as pursed lip breathing or other method taught during program session;Compliance with respiratory medication;Demonstrates proper use of MDI's             Oxygen Re-Evaluation:   Oxygen Discharge (Final Oxygen Re-Evaluation):   Initial Exercise Prescription:  Initial Exercise Prescription - 03/22/21 1400       Date of Initial Exercise RX and Referring Provider   Date 03/22/21    Referring Provider [name]    Expected Discharge Date 08/12/21      Treadmill   MPH 1.2    Grade 0    Minutes 17    METs 0      NuStep   Level 1    SPM 60    Minutes 22    METs 0      Prescription Details   Frequency (times per week) 2    Duration Progress to 30 minutes of continuous aerobic without signs/symptoms of physical distress      Intensity   THRR 40-80% of Max Heartrate 60-120    Ratings of Perceived Exertion 11-13    Perceived Dyspnea 0-4      Progression   Progression Continue to progress workloads to maintain intensity without signs/symptoms of physical distress.      Resistance Training   Training Prescription Yes    Weight 3    Reps 10-15             Perform Capillary Blood Glucose checks as needed.  Exercise Prescription Changes:   Exercise  Comments:   Exercise Goals and Review:   Exercise Goals     Row Name 03/22/21 1421             Exercise Goals   Increase Physical Activity Yes       Intervention Provide advice, education, support and counseling about physical activity/exercise needs.;Develop an individualized exercise prescription for aerobic and resistive training based on initial evaluation findings, risk stratification, comorbidities and participant's personal goals.       Expected Outcomes Short Term: Attend rehab on a regular basis to increase amount of physical activity.;Long Term: Add in home exercise to make exercise part of routine and to increase amount of physical activity.;Long Term: Exercising regularly at least 3-5 days a week.       Increase Strength and Stamina Yes       Intervention Provide advice, education, support and counseling about physical activity/exercise needs.;Develop an individualized exercise prescription for aerobic and resistive training based on  initial evaluation findings, risk stratification, comorbidities and participant's personal goals.       Expected Outcomes Short Term: Increase workloads from initial exercise prescription for resistance, speed, and METs.;Short Term: Perform resistance training exercises routinely during rehab and add in resistance training at home;Long Term: Improve cardiorespiratory fitness, muscular endurance and strength as measured by increased METs and functional capacity ( )       Able to understand and use rate of perceived exertion (RPE) scale Yes       Intervention Provide education and explanation on how to use RPE scale       Expected Outcomes Short Term: Able to use RPE daily in rehab to express subjective intensity level;Long Term:  Able to use RPE to guide intensity level when exercising independently       Able to understand and use Dyspnea scale Yes       Intervention Provide education and explanation on how to use Dyspnea scale       Expected  Outcomes Short Term: Able to use Dyspnea scale daily in rehab to express subjective sense of shortness of breath during exertion;Long Term: Able to use Dyspnea scale to guide intensity level when exercising independently       Knowledge and understanding of Target Heart Rate Range (THRR) Yes       Intervention Provide education and explanation of THRR including how the numbers were predicted and where they are located for reference       Expected Outcomes Short Term: Able to state/look up THRR;Short Term: Able to use daily as guideline for intensity in rehab;Long Term: Able to use THRR to govern intensity when exercising independently       Understanding of Exercise Prescription Yes       Intervention Provide education, explanation, and written materials on patient's individual exercise prescription       Expected Outcomes Short Term: Able to explain program exercise prescription;Long Term: Able to explain home exercise prescription to exercise independently                Exercise Goals Re-Evaluation :   Discharge Exercise Prescription (Final Exercise Prescription Changes):   Nutrition:  Target Goals: Understanding of nutrition guidelines, daily intake of sodium 1500mg , cholesterol 200mg , calories 30% from fat and 7% or less from saturated fats, daily to have 5 or more servings of fruits and vegetables.  Biometrics:  Pre Biometrics - 03/22/21 1422       Pre Biometrics   Height  (1.651 m)    Weight 137 lb 2 oz (62.2 kg)    Waist Circumference 34.5 inches    Hip Circumference 39 inches    Waist to Hip Ratio 0.88 %    BMI (Calculated) 22.82    Triceps Skinfold 14 mm    % Body Fat 32.8 %    Grip Strength 23.4 kg    Flexibility 9 in    Single Leg Stand 5 seconds              Nutrition Therapy Plan and Nutrition Goals:   Nutrition Assessments:  Nutrition Assessments - 03/22/21 1350       MEDFICTS Scores   Pre Score 70            MEDIFICTS Score  Key: ?70 Need to make dietary changes  40-70 Heart Healthy Diet ? 40 Therapeutic Level Cholesterol Diet   Picture Your Plate Scores: <16 Unhealthy dietary pattern with much room for improvement. 41-50 Dietary pattern unlikely to meet  recommendations for good health and room for improvement. 51-60 More healthful dietary pattern, with some room for improvement.  >60 Healthy dietary pattern, although there may be some specific behaviors that could be improved.    Nutrition Goals Re-Evaluation:   Nutrition Goals Discharge (Final Nutrition Goals Re-Evaluation):   Psychosocial: Target Goals: Acknowledge presence or absence of significant depression and/or stress, maximize coping skills, provide positive support system. Participant is able to verbalize types and ability to use techniques and skills needed for reducing stress and depression.  Initial Review & Psychosocial Screening:  Initial Psych Review & Screening - 03/22/21 1250       Initial Review   Current issues with Current Anxiety/Panic;Current Sleep Concerns;Current Stress Concerns   on ambien for sleep and xanax PRN for anxiety   Source of Stress Concerns Chronic Illness    Comments She was diagnosed with ILD about a year ago and this causes her stress      Family Dynamics   Good Support System? Yes    Comments Her support system consists of her husband, her kids, and her sisters.      Barriers   Psychosocial barriers to participate in program The patient should benefit from training in stress management and relaxation.      Screening Interventions   Interventions Encouraged to exercise;To provide support and resources with identified psychosocial needs;Provide feedback about the scores to participant    Expected Outcomes Long Term Goal: Stressors or current issues are controlled or eliminated.;Long Term goal: The participant improves quality of Life and PHQ9 Scores as seen by post scores and/or verbalization of changes              Quality of Life Scores:  Quality of Life - 03/22/21 1423       Quality of Life Scores   Health/Function Pre 20.75 %    Socioeconomic Pre 20.14 %    Psych/Spiritual Pre 21 %    Family Pre 21 %    GLOBAL Pre 20.71 %            Scores of 19 and below usually indicate a poorer quality of life in these areas.  A difference of  2-3 points is a clinically meaningful difference.  A difference of 2-3 points in the total score of the Quality of Life Index has been associated with significant improvement in overall quality of life, self-image, physical symptoms, and general health in studies assessing change in quality of life.   PHQ-9: Recent Review Flowsheet Data     Depression screen [facility] 2/9 03/22/2021   Decreased Interest 2   Down, Depressed, Hopeless 2   PHQ - 2 Score 4   Altered sleeping 2    Tired, decreased energy 2   Change in appetite 2   Feeling bad or failure about yourself  1   Trouble concentrating 2   Moving slowly or fidgety/restless 1   Suicidal thoughts 0   PHQ-9 Score 14   Difficult doing work/chores Somewhat difficult      Interpretation of Total Score  Total Score Depression Severity:  1-4 = Minimal depression, 5-9 = Mild depression, 10-14 = Moderate depression, 15-19 = Moderately severe depression, 20-27 = Severe depression   Psychosocial Evaluation and Intervention:  Psychosocial Evaluation - 03/22/21 1341       Psychosocial Evaluation & Interventions   Interventions Stress management education;Relaxation education;Encouraged to exercise with the program and follow exercise prescription    Comments Pt has no barriers to completing cardiac  rehab. She takes ambien to help her sleep, but she reports that she still only gets about 5-6 per night. She also takes xanax PRN for her anxiety. She scored a 14 on her PHQ-9 and relates that mostly to not having much energy. Since she was diagnosed with ILD last year, she has not had much energy. She is  no longer able to work outside in the yard like she used to, so this makes her feel down. Her health condition also causes her stress. She reports that at this time she is coping well on her own and is hopeful that the pulmonary rehab program will help her gain her stamina and give her social support. She reports a good support system with her husband, children, and sisters. Her goals in the program are to gain her strength and stamina back and to decrease her SOB with exertion. She is excited to start the program.    Expected Outcomes Pt will continue to take xanax for anxiety and ambien for sleep and no other psychosocial issues will be identified.    Continue Psychosocial Services  No Follow up required             Psychosocial Re-Evaluation:   Psychosocial Discharge (Final Psychosocial Re-Evaluation):    Education: Education Goals: Education classes will be provided on a weekly basis, covering required topics. Participant will state understanding/return demonstration of topics presented.  Learning Barriers/Preferences:  Learning Barriers/Preferences - 03/22/21 1254       Learning Barriers/Preferences   Learning Barriers None    Learning Preferences Written Material;Audio             Education Topics: How Lungs Work and Diseases: - Discuss the anatomy of the lungs and diseases that can affect the lungs, such as COPD.   Exercise: -Discuss the importance of exercise, FITT principles of exercise, normal and abnormal responses to exercise, and how to exercise safely.   Environmental Irritants: -Discuss types of environmental irritants and how to limit exposure to environmental irritants.   Meds/Inhalers and oxygen: - Discuss respiratory medications, definition of an inhaler and oxygen, and the proper way to use an inhaler and oxygen.   Energy Saving Techniques: - Discuss methods to conserve energy and decrease shortness of breath when performing activities of daily  living.    Bronchial Hygiene / Breathing Techniques: - Discuss breathing mechanics, pursed-lip breathing technique,  proper posture, effective ways to clear airways, and other functional breathing techniques   Cleaning Equipment: - Provides group verbal and written instruction about the health risks of elevated stress, cause of high stress, and healthy ways to reduce stress.   Nutrition I: Fats: - Discuss the types of cholesterol, what cholesterol does to the body, and how cholesterol levels can be controlled.   Nutrition II: Labels: -Discuss the different components of food labels and how to read food labels.   Respiratory Infections: - Discuss the signs and symptoms of respiratory infections, ways to prevent respiratory infections, and the importance of seeking medical treatment when having a respiratory infection.   Stress I: Signs and Symptoms: - Discuss the causes of stress, how stress may lead to anxiety and depression, and ways to limit stress.   Stress II: Relaxation: -Discuss relaxation techniques to limit stress.   Oxygen for Home/Travel: - Discuss how to prepare for travel when on oxygen and proper ways to transport and store oxygen to ensure safety.   Knowledge Questionnaire Score:  Knowledge Questionnaire Score - 03/22/21 1255  Knowledge Questionnaire Score   Pre Score 14/18             Core Components/Risk Factors/Patient Goals at Admission:  Personal Goals and Risk Factors at Admission - 03/22/21 1259       Core Components/Risk Factors/Patient Goals on Admission    Weight Management Yes;Weight Maintenance    Intervention Weight Management: Develop a combined nutrition and exercise program designed to reach desired caloric intake, while maintaining appropriate intake of nutrient and fiber, sodium and fats, and appropriate energy expenditure required for the weight goal.;Weight Management: Provide education and appropriate resources to help  participant work on and attain dietary goals.    Expected Outcomes Short Term: Continue to assess and modify interventions until short term weight is achieved;Long Term: Adherence to nutrition and physical activity/exercise program aimed toward attainment of established weight goal;Weight Maintenance: Understanding of the daily nutrition guidelines, which includes 25-35% calories from fat, 7% or less cal from saturated fats, less than 200mg  cholesterol, less than 1.5gm of sodium, & 5 or more servings of fruits and vegetables daily    Improve shortness of breath with ADL's Yes    Intervention Provide education, individualized exercise plan and daily activity instruction to help decrease symptoms of SOB with activities of daily living.    Expected Outcomes Short Term: Improve cardiorespiratory fitness to achieve a reduction of symptoms when performing ADLs;Long Term: Be able to perform more ADLs without symptoms or delay the onset of symptoms             Core Components/Risk Factors/Patient Goals Review:    Core Components/Risk Factors/Patient Goals at Discharge (Final Review):    ITP Comments:   Comments: Patient arrived for 1st visit/orientation/education at 1230. Patient was referred to PR by Dr. due to ILD (J84.9). During orientation advised patient on arrival and appointment times what to wear, what to do before, during and after exercise. Reviewed attendance and class policy.  Pt is scheduled to return Pulmonary Rehab on 03/30/2021 at 1330. Pt was advised to come to class 15 minutes before class starts.  Discussed RPE/Dpysnea scales. Patient participated in warm up stretches. Patient was able to complete 6 minute walk test. Patient was measured for the equipment. Discussed equipment safety with patient. Took patient pre-anthropometric measurements. Patient finished visit at 1350.

## 2021-03-22 NOTE — Progress Notes (Signed)
Cardiac/Pulmonary Rehab Medication Review by a Pharmacist  Does the patient  feel that his/her medications are working for him/her?  yes  Has the patient been experiencing any side effects to the medications prescribed?  no  Does the patient measure his/her own blood pressure or blood glucose at home?  no   Does the patient have any problems obtaining medications due to transportation or finances?   no  Understanding of regimen: excellent Understanding of indications: excellent Potential of compliance: good  Questions asked to Determine Patient Understanding of Medication Regimen:  1. What is the name of the medication?  2. What is the medication used for?  3. When should it be taken?  4. How much should be taken?  5. How will you take it?  6. What side effects should you report?  Understanding Defined as: Excellent: All questions above are correct Good: Questions 1-4 are correct Fair: Questions 1-2 are correct  Poor: 1 or none of the above questions are correct   Pharmacist comments: Patient presents today for pulmonary rehab. She has Idiopathic pulmonary fibrosis  and is on OFEV. She did not tolerate the 150mg  dose but she is doing ok on OFEV 100mg  (LFTS were elevated with higher dose).Fortunately, she is covered through a secondary source for OFEV prescriptions. We reviewed her other medications and the patient is on zetia but recent blood work does not show improvement in lipid profile. She cannot tolerate statins and PCP is considering transition to injectable agents to treat elevated cholesterol. Patient also on BP medications but she is not checking BP/HR because it is not correlating with [facility]ot[facility] office. Results.  Suggested have MD write RX and get a new BP cuff filled through the pharmacy. No other questions with her medications.  Thanks for the opportunity to participate in the care of this patient,  , BS , BCPS Clinical Pharmacist Pager  [phone] 03/22/2021 1:12 PM

## 2021-03-30 ENCOUNTER — Encounter (HOSPITAL_COMMUNITY)
Admission: RE | Admit: 2021-03-30 | Discharge: 2021-03-30 | Disposition: A | Discharge status: Home / Self Care | Payer: Medicare Other | Source: Ambulatory Visit | Attending: Pulmonary Disease | Admitting: Pulmonary Disease

## 2021-03-30 ENCOUNTER — Other Ambulatory Visit: Payer: Self-pay

## 2021-03-30 VITALS — Wt 138.0 lb

## 2021-03-30 DIAGNOSIS — J849 Interstitial pulmonary disease, unspecified: Secondary | ICD-10-CM

## 2021-03-30 NOTE — Progress Notes (Signed)
Daily Session Note  Patient Details  Name: Debra Jimenez MRN: [id number] Date of Birth: 01/15/1951 Referring Provider:   Flowsheet Row PULMONARY REHAB OTHER RESP ORIENTATION from 03/22/2021 in [place]  Referring Provider Dr. Elsworth Soho       Encounter Date: 03/30/2021  Check In:  Session Check In - 03/30/21 1330       Check-In   Supervising physician immediately available to respond to emergencies [facility] MD immediately available    Physician(s) Dr. Marlou Porch    Location [facility]    Staff Present Redge Gainer, BS, Exercise Physiologist;Serrena Linderman Wynetta Emery, RN, BSN;Phyllis Billingsley, RN    Virtual Visit No    Medication changes reported     No    Fall or balance concerns reported    No    Tobacco Cessation No Change    Warm-up and Cool-down Performed as group-led instruction    Resistance Training Performed Yes    VAD Patient? No    PAD/SET Patient? No      Pain Assessment   Currently in Pain? No/denies    Multiple Pain Sites No             Capillary Blood Glucose: No results found for this or any previous visit (from the past 24 hour(s)).    Social History   Tobacco Use  Smoking Status Former   Packs/day: 0.50   Years: 15.00   Pack years: 7.50   Types: Cigarettes   Quit date: 1985   Years since quitting: 37.8  Smokeless Tobacco Never    Goals Met:  Proper associated with RPD/PD & O2 Sat Independence with exercise equipment Using PLB without cueing & demonstrates good technique Exercise tolerated well No report of concerns or symptoms today Strength training completed today  Goals Unmet:  Not Applicable  Comments: Check out 1430.   Dr. Kathie Dike is Medical Director for [facility].

## 2021-03-31 NOTE — Progress Notes (Signed)
Pulmonary Individual Treatment Plan  Patient Details  Name: Debra Jimenez MRN: [id number] Date of Birth: 1951/06/12 Referring Provider:   Flowsheet Row PULMONARY REHAB OTHER RESP ORIENTATION from 03/22/2021 in [facility] CARDIAC REHABILITATION  Referring Provider [name]       Initial Encounter Date:  Flowsheet Row PULMONARY REHAB OTHER RESP ORIENTATION from 03/22/2021 in [facility] CARDIAC REHABILITATION  Date 03/22/21       Visit Diagnosis: ILD (interstitial lung disease) (HCC)  Patient's Home Medications on Admission:   Current Outpatient Medications:    albuterol (VENTOLIN HFA) 108 (90 Base) MCG/ACT inhaler, Inhale 1 puff into the lungs as needed., Disp: , Rfl:    ALPRAZolam (XANAX) 0.25 MG tablet, Take 0.25 mg by mouth daily as needed., Disp: , Rfl:    ergocalciferol (VITAMIN D2) 1.25 MG (50000 UT) capsule, Take 50,000 Units by mouth once a week. Takes on Sunday, Disp: , Rfl:    ezetimibe (ZETIA) 10 MG tablet, Take 10 mg by mouth daily., Disp: , Rfl:    levothyroxine (SYNTHROID) 100 MCG tablet, Take 100 mcg by mouth daily before breakfast., Disp: , Rfl:    losartan (COZAAR) 50 MG tablet, Take 50 mg by mouth daily. (Patient not taking: Reported on 03/22/2021), Disp: , Rfl:    Nintedanib (OFEV) 100 MG CAPS, Take 1 capsule (100 mg total) by mouth 2 (two) times daily., Disp: 60 capsule, Rfl: 11   ondansetron (ZOFRAN) 4 MG tablet, Take 1 tablet (4 mg total) by mouth 2 (two) times daily as needed for nausea or vomiting., Disp: 30 tablet, Rfl: 1   zolpidem (AMBIEN) 5 MG tablet, Take 5 mg by mouth at bedtime as needed., Disp: , Rfl:   Past Medical History: Past Medical History:  Diagnosis Date   Hypothyroidism    OA (osteoarthritis)    RIGHT HIP AND LEFT HIP   Wears glasses     Tobacco Use: Social History   Tobacco Use  Smoking Status Former   Packs/day: 0.50   Years: 15.00   Pack years: 7.50   Types: Cigarettes   Quit date: 1985   Years since quitting: 37.8   Smokeless Tobacco Never    Labs: Recent Review Flowsheet Data   There is no flowsheet data to display.     Capillary Blood Glucose: No results found for: GLUCAP   Pulmonary Assessment Scores:  Pulmonary Assessment Scores     Row Name 03/22/21 1248 03/22/21 1249       ADL UCSD   SOB Score total 55 --         CAT Score   CAT Score 28 --         mMRC Score   mMRC Score -- 3            UCSD: Self-administered rating of dyspnea associated with activities of daily living (ADLs) 6-point scale (0 = "not at all" to 5 = "maximal or unable to do because of breathlessness")  Scoring Scores range from 0 to 120.  Minimally important difference is 5 units  CAT: CAT can identify the health impairment of COPD patients and is better correlated with disease progression.  CAT has a scoring range of zero to 40. The CAT score is classified into four groups of low (less than 10), medium (10 - 20), high (21-30) and very high (31-40) based on the impact level of disease on health status. A CAT score over 10 suggests significant symptoms.  A worsening CAT score could be explained by an  exacerbation, poor medication adherence, poor inhaler technique, or progression of COPD or comorbid conditions.  CAT MCID is 2 points  mMRC: mMRC (Modified Medical Research Council) Dyspnea Scale is used to assess the degree of baseline functional disability in patients of respiratory disease due to dyspnea. No minimal important difference is established. A decrease in score of 1 point or greater is considered a positive change.   Pulmonary Function Assessment:   Exercise Target Goals: Exercise Program Goal: Individual exercise prescription set using results from initial 6 min walk test and THRR while considering  patient's activity barriers and safety.   Exercise Prescription Goal: Initial exercise prescription builds to 30-45 minutes a day of aerobic activity, 2-3 days per week.  Home exercise  guidelines will be given to patient during program as part of exercise prescription that the participant will acknowledge.  Activity Barriers & Risk Stratification:  Activity Barriers & Cardiac Risk Stratification - 03/22/21 1246       Activity Barriers & Cardiac Risk Stratification   Activity Barriers Right Hip Replacement;Deconditioning;Shortness of Breath    Cardiac Risk Stratification Low             6 Minute Walk:  6 Minute Walk     Row Name 03/22/21 1414         6 Minute Walk   Phase Initial     Distance 1350 feet     Walk Time 6 minutes     # of Rest Breaks 0     MPH 2.6     METS 3.19     RPE 12     Perceived Dyspnea  15     VO2 Peak 11.16     Symptoms No     Resting HR 89 bpm     Resting BP 124/68     Resting Oxygen Saturation  96 %     Exercise Oxygen Saturation  during 6 min walk 92 %     Max Ex. HR 142 bpm     Max Ex. BP 140/75     2 Minute Post BP 135/70           Interval HR   1 Minute HR 133     2 Minute HR 136     3 Minute HR 141     4 Minute HR 141     5 Minute HR 142     6 Minute HR 143     2 Minute Post HR 103     Interval Heart Rate? Yes           Interval Oxygen   Interval Oxygen? Yes     Baseline Oxygen Saturation % 96 %     1 Minute Oxygen Saturation % 92 %     1 Minute Liters of Oxygen 0 L     2 Minute Oxygen Saturation % 91 %     2 Minute Liters of Oxygen 0 L     3 Minute Oxygen Saturation % 90 %     3 Minute Liters of Oxygen 0 L     4 Minute Oxygen Saturation % 90 %     4 Minute Liters of Oxygen 0 L     5 Minute Oxygen Saturation % 89 %     5 Minute Liters of Oxygen 0 L     6 Minute Oxygen Saturation % 88 %     6 Minute Liters of Oxygen 0 L     2 Minute Post  Oxygen Saturation % 96 %     2 Minute Post Liters of Oxygen 0 L              Oxygen Initial Assessment:  Oxygen Initial Assessment - 03/22/21 1425       Initial 6 min Walk   Oxygen Used None      Program Oxygen Prescription   Program Oxygen Prescription  None      Intervention   Short Term Goals To learn and exhibit compliance with exercise, home and travel O2 prescription;To learn and understand importance of monitoring SPO2 with pulse oximeter and demonstrate accurate use of the pulse oximeter.;To learn and understand importance of maintaining oxygen saturations>88%;To learn and demonstrate proper pursed lip breathing techniques or other breathing techniques. ;To learn and demonstrate proper use of respiratory medications    Long  Term Goals Exhibits compliance with exercise, home  and travel O2 prescription;Verbalizes importance of monitoring SPO2 with pulse oximeter and return demonstration;Maintenance of O2 saturations>88%;Exhibits proper breathing techniques, such as pursed lip breathing or other method taught during program session;Compliance with respiratory medication;Demonstrates proper use of MDI's             Oxygen Re-Evaluation:  Oxygen Re-Evaluation     Row Name 03/30/21 1527             Program Oxygen Prescription   Program Oxygen Prescription None               Home Oxygen   Home Oxygen Device None       Sleep Oxygen Prescription None       Home Exercise Oxygen Prescription None       Home Resting Oxygen Prescription None       Compliance with Home Oxygen Use Yes               Goals/Expected Outcomes   Short Term Goals To learn and exhibit compliance with exercise, home and travel O2 prescription;To learn and understand importance of maintaining oxygen saturations>88%;To learn and demonstrate proper pursed lip breathing techniques or other breathing techniques. ;To learn and demonstrate proper use of respiratory medications;To learn and understand importance of monitoring SPO2 with pulse oximeter and demonstrate accurate use of the pulse oximeter.       Long  Term Goals Exhibits compliance with exercise, home  and travel O2 prescription;Verbalizes importance of monitoring SPO2 with pulse oximeter and return  demonstration;Maintenance of O2 saturations>88%;Exhibits proper breathing techniques, such as pursed lip breathing or other method taught during program session;Compliance with respiratory medication;Demonstrates proper use of MDI's       Goals/Expected Outcomes compliance                Oxygen Discharge (Final Oxygen Re-Evaluation):  Oxygen Re-Evaluation - 03/30/21 1527       Program Oxygen Prescription   Program Oxygen Prescription None      Home Oxygen   Home Oxygen Device None    Sleep Oxygen Prescription None    Home Exercise Oxygen Prescription None    Home Resting Oxygen Prescription None    Compliance with Home Oxygen Use Yes      Goals/Expected Outcomes   Short Term Goals To learn and exhibit compliance with exercise, home and travel O2 prescription;To learn and understand importance of maintaining oxygen saturations>88%;To learn and demonstrate proper pursed lip breathing techniques or other breathing techniques. ;To learn and demonstrate proper use of respiratory medications;To learn and understand importance of monitoring SPO2 with pulse oximeter and demonstrate  accurate use of the pulse oximeter.    Long  Term Goals Exhibits compliance with exercise, home  and travel O2 prescription;Verbalizes importance of monitoring SPO2 with pulse oximeter and return demonstration;Maintenance of O2 saturations>88%;Exhibits proper breathing techniques, such as pursed lip breathing or other method taught during program session;Compliance with respiratory medication;Demonstrates proper use of MDI's    Goals/Expected Outcomes compliance             Initial Exercise Prescription:  Initial Exercise Prescription - 03/22/21 1400       Date of Initial Exercise RX and Referring Provider   Date 03/22/21    Referring Provider [name]    Expected Discharge Date 08/12/21      Treadmill   MPH 1.2    Grade 0    Minutes 17    METs 0      NuStep   Level 1    SPM 60    Minutes 22     METs 0      Prescription Details   Frequency (times per week) 2    Duration Progress to 30 minutes of continuous aerobic without signs/symptoms of physical distress      Intensity   THRR 40-80% of Max Heartrate 60-120    Ratings of Perceived Exertion 11-13    Perceived Dyspnea 0-4      Progression   Progression Continue to progress workloads to maintain intensity without signs/symptoms of physical distress.      Resistance Training   Training Prescription Yes    Weight 3    Reps 10-15             Perform Capillary Blood Glucose checks as needed.  Exercise Prescription Changes:   Exercise Prescription Changes     Row Name 03/30/21 1500             Response to Exercise   Blood Pressure (Admit) 120/60       Blood Pressure (Exercise) 160/80       Blood Pressure (Exit) 120/72       Heart Rate (Admit) 90 bpm       Heart Rate (Exercise) 139 bpm       Heart Rate (Exit) 98 bpm       Oxygen Saturation (Admit) 96 %       Oxygen Saturation (Exercise) 89 %       Oxygen Saturation (Exit) 96 %       Rating of Perceived Exertion (Exercise) 12       Perceived Dyspnea (Exercise) 11       Duration Continue with 30 min of aerobic exercise without signs/symptoms of physical distress.       Intensity THRR unchanged               Resistance Training   Training Prescription Yes       Weight 3       Reps 10-15       Time 10 Minutes               Treadmill   MPH 1.2       Grade 0       Minutes 17       METs 1.92               NuStep   Level 1       SPM 61       Minutes 22       METs 1.8  Exercise Comments:   Exercise Goals and Review:   Exercise Goals     Row Name 03/22/21 1421 03/30/21 1525           Exercise Goals   Increase Physical Activity Yes Yes      Intervention Provide advice, education, support and counseling about physical activity/exercise needs.;Develop an individualized exercise prescription for aerobic and resistive  training based on initial evaluation findings, risk stratification, comorbidities and participant's personal goals. Provide advice, education, support and counseling about physical activity/exercise needs.;Develop an individualized exercise prescription for aerobic and resistive training based on initial evaluation findings, risk stratification, comorbidities and participant's personal goals.      Expected Outcomes Short Term: Attend rehab on a regular basis to increase amount of physical activity.;Long Term: Add in home exercise to make exercise part of routine and to increase amount of physical activity.;Long Term: Exercising regularly at least 3-5 days a week. Short Term: Attend rehab on a regular basis to increase amount of physical activity.;Long Term: Add in home exercise to make exercise part of routine and to increase amount of physical activity.;Long Term: Exercising regularly at least 3-5 days a week.      Increase Strength and Stamina Yes Yes      Intervention Provide advice, education, support and counseling about physical activity/exercise needs.;Develop an individualized exercise prescription for aerobic and resistive training based on initial evaluation findings, risk stratification, comorbidities and participant's personal goals. Provide advice, education, support and counseling about physical activity/exercise needs.;Develop an individualized exercise prescription for aerobic and resistive training based on initial evaluation findings, risk stratification, comorbidities and participant's personal goals.      Expected Outcomes Short Term: Increase workloads from initial exercise prescription for resistance, speed, and METs.;Short Term: Perform resistance training exercises routinely during rehab and add in resistance training at home;Long Term: Improve cardiorespiratory fitness, muscular endurance and strength as measured by increased METs and functional capacity ( ) Short Term: Increase  workloads from initial exercise prescription for resistance, speed, and METs.;Short Term: Perform resistance training exercises routinely during rehab and add in resistance training at home;Long Term: Improve cardiorespiratory fitness, muscular endurance and strength as measured by increased METs and functional capacity ( )      Able to understand and use rate of perceived exertion (RPE) scale Yes Yes      Intervention Provide education and explanation on how to use RPE scale Provide education and explanation on how to use RPE scale      Expected Outcomes Short Term: Able to use RPE daily in rehab to express subjective intensity level;Long Term:  Able to use RPE to guide intensity level when exercising independently Short Term: Able to use RPE daily in rehab to express subjective intensity level;Long Term:  Able to use RPE to guide intensity level when exercising independently      Able to understand and use Dyspnea scale Yes Yes      Intervention Provide education and explanation on how to use Dyspnea scale Provide education and explanation on how to use Dyspnea scale      Expected Outcomes Short Term: Able to use Dyspnea scale daily in rehab to express subjective sense of shortness of breath during exertion;Long Term: Able to use Dyspnea scale to guide intensity level when exercising independently Short Term: Able to use Dyspnea scale daily in rehab to express subjective sense of shortness of breath during exertion;Long Term: Able to use Dyspnea scale to guide intensity level when exercising independently      Knowledge  and understanding of Target Heart Rate Range (THRR) Yes Yes      Intervention Provide education and explanation of THRR including how the numbers were predicted and where they are located for reference Provide education and explanation of THRR including how the numbers were predicted and where they are located for reference      Expected Outcomes Short Term: Able to state/look up  THRR;Short Term: Able to use daily as guideline for intensity in rehab;Long Term: Able to use THRR to govern intensity when exercising independently Short Term: Able to state/look up THRR;Short Term: Able to use daily as guideline for intensity in rehab;Long Term: Able to use THRR to govern intensity when exercising independently      Understanding of Exercise Prescription Yes Yes      Intervention Provide education, explanation, and written materials on patient's individual exercise prescription Provide education, explanation, and written materials on patient's individual exercise prescription      Expected Outcomes Short Term: Able to explain program exercise prescription;Long Term: Able to explain home exercise prescription to exercise independently Short Term: Able to explain program exercise prescription;Long Term: Able to explain home exercise prescription to exercise independently               Exercise Goals Re-Evaluation :  Exercise Goals Re-Evaluation     Row Name 03/30/21 1525             Exercise Goal Re-Evaluation   Exercise Goals Review Increase Physical Activity;Increase Strength and Stamina;Able to understand and use rate of perceived exertion (RPE) scale;Knowledge and understanding of Target Heart Rate Range (THRR);Understanding of Exercise Prescription;Able to understand and use Dyspnea scale       Comments Pt has completed her first session of cardiac rehab. She seems eager to be in the program and to get stronger. She is currently exercising at 1.92 METs on the TM. Will continue to monitor and progress as able.       Expected Outcomes Through exercise at rehab and at home, the patient will meet their stated goals.                Discharge Exercise Prescription (Final Exercise Prescription Changes):  Exercise Prescription Changes - 03/30/21 1500       Response to Exercise   Blood Pressure (Admit) 120/60    Blood Pressure (Exercise) 160/80    Blood Pressure  (Exit) 120/72    Heart Rate (Admit) 90 bpm    Heart Rate (Exercise) 139 bpm    Heart Rate (Exit) 98 bpm    Oxygen Saturation (Admit) 96 %    Oxygen Saturation (Exercise) 89 %    Oxygen Saturation (Exit) 96 %    Rating of Perceived Exertion (Exercise) 12    Perceived Dyspnea (Exercise) 11    Duration Continue with 30 min of aerobic exercise without signs/symptoms of physical distress.    Intensity THRR unchanged      Resistance Training   Training Prescription Yes    Weight 3    Reps 10-15    Time 10 Minutes      Treadmill   MPH 1.2    Grade 0    Minutes 17    METs 1.92      NuStep   Level 1    SPM 61    Minutes 22    METs 1.8             Nutrition:  Target Goals: Understanding of nutrition guidelines, daily intake of sodium <  1500mg , cholesterol 200mg , calories 30% from fat and 7% or less from saturated fats, daily to have 5 or more servings of fruits and vegetables.  Biometrics:  Pre Biometrics - 03/22/21 1422       Pre Biometrics   Height 5\' 5"  (1.651 m)    Weight 62.2 kg    Waist Circumference 34.5 inches    Hip Circumference 39 inches    Waist to Hip Ratio 0.88 %    BMI (Calculated) 22.82    Triceps Skinfold 14 mm    % Body Fat 32.8 %    Grip Strength 23.4 kg    Flexibility 9 in    Single Leg Stand 5 seconds              Nutrition Therapy Plan and Nutrition Goals:  Nutrition Therapy & Goals - 03/24/21 0854       Personal Nutrition Goals   Comments Patient scored 70 on her diet assessment. We offer 2 educational sessions on heart heatlhy nutrition with handouts and offer assistance with RD referral if patient is interested.      Intervention Plan   Intervention Nutrition handout(s) given to patient.             Nutrition Assessments:  Nutrition Assessments - 03/22/21 1350       MEDFICTS Scores   Pre Score 70            MEDIFICTS Score Key: ?70 Need to make dietary changes  40-70 Heart Healthy Diet ? 40 Therapeutic Level  Cholesterol Diet   Picture Your Plate Scores: 05/24/21 Unhealthy dietary pattern with much room for improvement. 41-50 Dietary pattern unlikely to meet recommendations for good health and room for improvement. 51-60 More healthful dietary pattern, with some room for improvement.  >60 Healthy dietary pattern, although there may be some specific behaviors that could be improved.    Nutrition Goals Re-Evaluation:   Nutrition Goals Discharge (Final Nutrition Goals Re-Evaluation):   Psychosocial: Target Goals: Acknowledge presence or absence of significant depression and/or stress, maximize coping skills, provide positive support system. Participant is able to verbalize types and ability to use techniques and skills needed for reducing stress and depression.  Initial Review & Psychosocial Screening:  Initial Psych Review & Screening - 03/22/21 1250       Initial Review   Current issues with Current Anxiety/Panic;Current Sleep Concerns;Current Stress Concerns   on ambien for sleep and xanax PRN for anxiety   Source of Stress Concerns Chronic Illness    Comments She was diagnosed with ILD about a year ago and this causes her stress      Family Dynamics   Good Support System? Yes    Comments Her support system consists of her husband, her kids, and her sisters.      Barriers   Psychosocial barriers to participate in program The patient should benefit from training in stress management and relaxation.      Screening Interventions   Interventions Encouraged to exercise;To provide support and resources with identified psychosocial needs;Provide feedback about the scores to participant    Expected Outcomes Long Term Goal: Stressors or current issues are controlled or eliminated.;Long Term goal: The participant improves quality of Life and PHQ9 Scores as seen by post scores and/or verbalization of changes             Quality of Life Scores:  Quality of Life - 03/22/21 1423        Quality of Life Scores  Health/Function Pre 20.75 %    Socioeconomic Pre 20.14 %    Psych/Spiritual Pre 21 %    Family Pre 21 %    GLOBAL Pre 20.71 %            Scores of 19 and below usually indicate a poorer quality of life in these areas.  A difference of  2-3 points is a clinically meaningful difference.  A difference of 2-3 points in the total score of the Quality of Life Index has been associated with significant improvement in overall quality of life, self-image, physical symptoms, and general health in studies assessing change in quality of life.   PHQ-9: Recent Review Flowsheet Data     Depression screen [facility] 2/9 03/22/2021   Decreased Interest 2   Down, Depressed, Hopeless 2   PHQ - 2 Score 4   Altered sleeping 2    Tired, decreased energy 2   Change in appetite 2   Feeling bad or failure about yourself  1   Trouble concentrating 2   Moving slowly or fidgety/restless 1   Suicidal thoughts 0   PHQ-9 Score 14   Difficult doing work/chores Somewhat difficult      Interpretation of Total Score  Total Score Depression Severity:  1-4 = Minimal depression, 5-9 = Mild depression, 10-14 = Moderate depression, 15-19 = Moderately severe depression, 20-27 = Severe depression   Psychosocial Evaluation and Intervention:  Psychosocial Evaluation - 03/22/21 1341       Psychosocial Evaluation & Interventions   Interventions Stress management education;Relaxation education;Encouraged to exercise with the program and follow exercise prescription    Comments Pt has no barriers to completing cardiac rehab. She takes ambien to help her sleep, but she reports that she still only gets about 5-6 per night. She also takes xanax PRN for her anxiety. She scored a 14 on her PHQ-9 and relates that mostly to not having much energy. Since she was diagnosed with ILD last year, she has not had much energy. She is no longer able to work outside in the yard like she used to, so this makes her feel  down. Her health condition also causes her stress. She reports that at this time she is coping well on her own and is hopeful that the pulmonary rehab program will help her gain her stamina and give her social support. She reports a good support system with her husband, children, and sisters. Her goals in the program are to gain her strength and stamina back and to decrease her SOB with exertion. She is excited to start the program.    Expected Outcomes Pt will continue to take xanax for anxiety and ambien for sleep and no other psychosocial issues will be identified.    Continue Psychosocial Services  No Follow up required             Psychosocial Re-Evaluation:  Psychosocial Re-Evaluation     Row Name 03/24/21 [phone]             Psychosocial Re-Evaluation   Current issues with Current Sleep Concerns;Current Stress Concerns       Comments Patient is new to the program starting soon. We will continue to montior her progress.       Expected Outcomes Patient will continue to have no psychosocial barriers identified.               Initial Review   Source of Stress Concerns Chronic Illness       Comments  She was diagnosed with ILD about a year ago and this causes her stress                Psychosocial Discharge (Final Psychosocial Re-Evaluation):  Psychosocial Re-Evaluation - 03/24/21 0854       Psychosocial Re-Evaluation   Current issues with Current Sleep Concerns;Current Stress Concerns    Comments Patient is new to the program starting soon. We will continue to montior her progress.    Expected Outcomes Patient will continue to have no psychosocial barriers identified.      Initial Review   Source of Stress Concerns Chronic Illness    Comments She was diagnosed with ILD about a year ago and this causes her stress              Education: Education Goals: Education classes will be provided on a weekly basis, covering required topics. Participant will state  understanding/return demonstration of topics presented.  Learning Barriers/Preferences:  Learning Barriers/Preferences - 03/22/21 1254       Learning Barriers/Preferences   Learning Barriers None    Learning Preferences Written Material;Audio             Education Topics: How Lungs Work and Diseases: - Discuss the anatomy of the lungs and diseases that can affect the lungs, such as COPD.   Exercise: -Discuss the importance of exercise, FITT principles of exercise, normal and abnormal responses to exercise, and how to exercise safely.   Environmental Irritants: -Discuss types of environmental irritants and how to limit exposure to environmental irritants.   Meds/Inhalers and oxygen: - Discuss respiratory medications, definition of an inhaler and oxygen, and the proper way to use an inhaler and oxygen.   Energy Saving Techniques: - Discuss methods to conserve energy and decrease shortness of breath when performing activities of daily living.    Bronchial Hygiene / Breathing Techniques: - Discuss breathing mechanics, pursed-lip breathing technique,  proper posture, effective ways to clear airways, and other functional breathing techniques   Cleaning Equipment: - Provides group verbal and written instruction about the health risks of elevated stress, cause of high stress, and healthy ways to reduce stress.   Nutrition I: Fats: - Discuss the types of cholesterol, what cholesterol does to the body, and how cholesterol levels can be controlled.   Nutrition II: Labels: -Discuss the different components of food labels and how to read food labels.   Respiratory Infections: - Discuss the signs and symptoms of respiratory infections, ways to prevent respiratory infections, and the importance of seeking medical treatment when having a respiratory infection.   Stress I: Signs and Symptoms: - Discuss the causes of stress, how stress may lead to anxiety and depression, and  ways to limit stress.   Stress II: Relaxation: -Discuss relaxation techniques to limit stress.   Oxygen for Home/Travel: - Discuss how to prepare for travel when on oxygen and proper ways to transport and store oxygen to ensure safety.   Knowledge Questionnaire Score:  Knowledge Questionnaire Score - 03/22/21 1255       Knowledge Questionnaire Score   Pre Score 14/18             Core Components/Risk Factors/Patient Goals at Admission:  Personal Goals and Risk Factors at Admission - 03/22/21 1259       Core Components/Risk Factors/Patient Goals on Admission    Weight Management Yes;Weight Maintenance    Intervention Weight Management: Develop a combined nutrition and exercise program designed to reach desired caloric intake, while maintaining  appropriate intake of nutrient and fiber, sodium and fats, and appropriate energy expenditure required for the weight goal.;Weight Management: Provide education and appropriate resources to help participant work on and attain dietary goals.    Expected Outcomes Short Term: Continue to assess and modify interventions until short term weight is achieved;Long Term: Adherence to nutrition and physical activity/exercise program aimed toward attainment of established weight goal;Weight Maintenance: Understanding of the daily nutrition guidelines, which includes 25-35% calories from fat, 7% or less cal from saturated fats, less than 200mg  cholesterol, less than 1.5gm of sodium, & 5 or more servings of fruits and vegetables daily    Improve shortness of breath with ADL's Yes    Intervention Provide education, individualized exercise plan and daily activity instruction to help decrease symptoms of SOB with activities of daily living.    Expected Outcomes Short Term: Improve cardiorespiratory fitness to achieve a reduction of symptoms when performing ADLs;Long Term: Be able to perform more ADLs without symptoms or delay the onset of symptoms              Core Components/Risk Factors/Patient Goals Review:   Goals and Risk Factor Review     Row Name 03/24/21 0856             Core Components/Risk Factors/Patient Goals Review   Personal Goals Review Weight Management/Obesity;Improve shortness of breath with ADL's;Other       Review Patient referred to [facility] with ILD by Dr. 05/24/21. She has not started the program yet. Her personal goals for the program are to gain her strength and stamina; decrease her SOB; and be able to do her ADL's. We will continue to monitor her progress as she works towards meeting these goals.       Expected Outcomes Patient will complete the program meeting both program and personal goals.                Core Components/Risk Factors/Patient Goals at Discharge (Final Review):   Goals and Risk Factor Review - 03/24/21 0856       Core Components/Risk Factors/Patient Goals Review   Personal Goals Review Weight Management/Obesity;Improve shortness of breath with ADL's;Other    Review Patient referred to [facility] with ILD by Dr. 05/24/21. She has not started the program yet. Her personal goals for the program are to gain her strength and stamina; decrease her SOB; and be able to do her ADL's. We will continue to monitor her progress as she works towards meeting these goals.    Expected Outcomes Patient will complete the program meeting both program and personal goals.             ITP Comments:   Comments: ITP REVIEW Pt is making expected progress toward pulmonary rehab goals after completing 2 sessions. Recommend continued exercise, life style modification, education, and utilization of breathing techniques to increase stamina and strength and decrease shortness of breath with exertion.

## 2021-04-01 ENCOUNTER — Encounter (HOSPITAL_COMMUNITY)
Admission: RE | Admit: 2021-04-01 | Discharge: 2021-04-01 | Disposition: A | Discharge status: Home / Self Care | Payer: Medicare Other | Source: Ambulatory Visit | Attending: Pulmonary Disease | Admitting: Pulmonary Disease

## 2021-04-01 DIAGNOSIS — J849 Interstitial pulmonary disease, unspecified: Secondary | ICD-10-CM

## 2021-04-01 NOTE — Progress Notes (Signed)
Daily Session Note  Patient Details  Name: Debra Jimenez MRN: [id number] Date of Birth: 1950-10-19 Referring Provider:   [place] from 03/22/2021 in [place]  Referring Provider Dr. Elsworth Soho       Encounter Date: 04/01/2021  Check In:  Session Check In - 04/01/21 1330       Check-In   Supervising physician immediately available to respond to emergencies [facility] MD immediately available    Physician(s) Dr. Debara Pickett    Location [facility]    Staff Present Geanie Cooley, RN;Dalton Kris Mouton, MS, ACSM-CEP, Exercise Physiologist    Virtual Visit No    Medication changes reported     No    Fall or balance concerns reported    No    Tobacco Cessation No Change    Warm-up and Cool-down Performed as group-led instruction    Resistance Training Performed Yes    VAD Patient? No    PAD/SET Patient? No      Pain Assessment   Currently in Pain? No/denies    Multiple Pain Sites No             Capillary Blood Glucose: No results found for this or any previous visit (from the past 24 hour(s)).    Social History   Tobacco Use  Smoking Status Former   Packs/day: 0.50   Years: 15.00   Pack years: 7.50   Types: Cigarettes   Quit date: 1985   Years since quitting: 37.8  Smokeless Tobacco Never    Goals Met:  Proper associated with RPD/PD & O2 Sat Independence with exercise equipment Improved SOB with ADL's Exercise tolerated well No report of concerns or symptoms today Strength training completed today  Goals Unmet:  Not Applicable  Comments: check out @ 2:30pm   Dr. Kathie Dike is Medical Director for [facility].

## 2021-04-06 ENCOUNTER — Encounter (HOSPITAL_COMMUNITY)
Admission: RE | Admit: 2021-04-06 | Discharge: 2021-04-06 | Disposition: A | Discharge status: Home / Self Care | Payer: Medicare Other | Source: Ambulatory Visit | Attending: Pulmonary Disease | Admitting: Pulmonary Disease

## 2021-04-06 DIAGNOSIS — J849 Interstitial pulmonary disease, unspecified: Secondary | ICD-10-CM | POA: Diagnosis not present

## 2021-04-06 NOTE — Progress Notes (Signed)
Daily Session Note  Patient Details  Name: Debra Jimenez MRN: [id number] Date of Birth: 1951/06/09 Referring Provider:   Flowsheet Row PULMONARY REHAB OTHER RESP ORIENTATION from 03/22/2021 in [place]  Referring Provider Dr. Elsworth Soho       Encounter Date: 04/06/2021  Check In:  Session Check In - 04/06/21 1330       Check-In   Supervising physician immediately available to respond to emergencies [facility] MD immediately available    Physician(s) Dr. Domenic Polite    Location [facility]    Staff Present Geanie Cooley, RN;Heather Otho Ket, BS, Exercise Physiologist;Dalton Kris Mouton, MS, ACSM-CEP, Exercise Physiologist    Virtual Visit No    Medication changes reported     No    Fall or balance concerns reported    No    Tobacco Cessation No Change    Warm-up and Cool-down Performed as group-led instruction    Resistance Training Performed Yes    VAD Patient? No    PAD/SET Patient? No      Pain Assessment   Currently in Pain? No/denies    Multiple Pain Sites No             Capillary Blood Glucose: No results found for this or any previous visit (from the past 24 hour(s)).    Social History   Tobacco Use  Smoking Status Former   Packs/day: 0.50   Years: 15.00   Pack years: 7.50   Types: Cigarettes   Quit date: 1985   Years since quitting: 37.8  Smokeless Tobacco Never    Goals Met:  Proper associated with RPD/PD & O2 Sat Independence with exercise equipment Improved SOB with ADL's Exercise tolerated well No report of concerns or symptoms today Strength training completed today  Goals Unmet:  Not Applicable  Comments: chck out @ 2:30pm   Dr. Kathie Dike is Medical Director for [facility].

## 2021-04-08 ENCOUNTER — Encounter (HOSPITAL_COMMUNITY)
Admission: RE | Admit: 2021-04-08 | Discharge: 2021-04-08 | Disposition: A | Discharge status: Home / Self Care | Payer: Medicare Other | Source: Ambulatory Visit | Attending: Pulmonary Disease | Admitting: Pulmonary Disease

## 2021-04-08 ENCOUNTER — Other Ambulatory Visit: Payer: Self-pay

## 2021-04-08 DIAGNOSIS — J849 Interstitial pulmonary disease, unspecified: Secondary | ICD-10-CM

## 2021-04-08 NOTE — Progress Notes (Signed)
Daily Session Note  Patient Details  Name: Anayla Giannetti MRN: [id number] Date of Birth: 01/16/1951 Referring Provider:   Flowsheet Row PULMONARY REHAB OTHER RESP ORIENTATION from 03/22/2021 in [place]  Referring Provider Dr. Elsworth Soho       Encounter Date: 04/08/2021  Check In:  Session Check In - 04/08/21 1330       Check-In   Supervising physician immediately available to respond to emergencies [facility] MD immediately available    Physician(s) Dr. Domenic Polite    Location [facility]    Staff Present Geanie Cooley, RN;Jibreel Fedewa Otho Ket, BS, Exercise Physiologist;Dalton Kris Mouton, MS, ACSM-CEP, Exercise Physiologist    Virtual Visit No    Medication changes reported     No    Fall or balance concerns reported    No    Tobacco Cessation No Change    Warm-up and Cool-down Performed as group-led instruction    Resistance Training Performed Yes    VAD Patient? No    PAD/SET Patient? No      Pain Assessment   Currently in Pain? No/denies    Multiple Pain Sites No             Capillary Blood Glucose: No results found for this or any previous visit (from the past 24 hour(s)).    Social History   Tobacco Use  Smoking Status Former   Packs/day: 0.50   Years: 15.00   Pack years: 7.50   Types: Cigarettes   Quit date: 1985   Years since quitting: 37.8  Smokeless Tobacco Never    Goals Met:  Independence with exercise equipment Exercise tolerated well No report of concerns or symptoms today Strength training completed today  Goals Unmet:  Not Applicable  Comments: check out 1430   Dr. Kathie Dike is Medical Director for [facility].

## 2021-04-13 ENCOUNTER — Encounter (HOSPITAL_COMMUNITY): Payer: Medicare Other

## 2021-04-15 ENCOUNTER — Encounter (HOSPITAL_COMMUNITY): Payer: Medicare Other

## 2021-04-20 ENCOUNTER — Encounter (HOSPITAL_COMMUNITY)
Admission: RE | Admit: 2021-04-20 | Discharge: 2021-04-20 | Disposition: A | Discharge status: Home / Self Care | Payer: Medicare Other | Source: Ambulatory Visit | Attending: Pulmonary Disease | Admitting: Pulmonary Disease

## 2021-04-20 DIAGNOSIS — J849 Interstitial pulmonary disease, unspecified: Secondary | ICD-10-CM | POA: Diagnosis not present

## 2021-04-20 NOTE — Progress Notes (Signed)
Daily Session Note  Patient Details  Name: Debra Jimenez MRN: [id number] Date of Birth: Jan 06, 1951 Referring Provider:   Flowsheet Row PULMONARY REHAB OTHER RESP ORIENTATION from 03/22/2021 in [place]  Referring Provider Dr. Elsworth Soho       Encounter Date: 04/20/2021  Check In:  Session Check In - 04/20/21 1340       Check-In   Supervising physician immediately available to respond to emergencies [facility] immediately available    Physician(s) Dr. Domenic Polite    Location [facility]    Staff Present Geanie Cooley, RN;Other;Dalton Fletcher, MS, ACSM-CEP, Exercise Physiologist    Virtual Visit No    Medication changes reported     No    Fall or balance concerns reported    No    Tobacco Cessation No Change    Warm-up and Cool-down Performed as group-led instruction    Resistance Training Performed Yes    VAD Patient? No    PAD/SET Patient? No      Pain Assessment   Currently in Pain? No/denies    Multiple Pain Sites No             Capillary Blood Glucose: No results found for this or any previous visit (from the past 24 hour(s)).    Social History   Tobacco Use  Smoking Status Former   Packs/day: 0.50   Years: 15.00   Pack years: 7.50   Types: Cigarettes   Quit date: 1985   Years since quitting: 37.8  Smokeless Tobacco Never    Goals Met:  Proper associated with RPD/PD & O2 Sat Independence with exercise equipment Using PLB without cueing & demonstrates good technique Exercise tolerated well No report of concerns or symptoms today Strength training completed today  Goals Unmet:  Not Applicable  Comments: check out @ 2:30pm   Dr. Kathie Dike is Medical Director for [facility].

## 2021-04-20 NOTE — Progress Notes (Signed)
I have reviewed a Home Exercise Prescription with Debra Jimenez . Debra Jimenez is not currently exercising at home.  The patient was advised to walk 1-3 days a week for 30-45 minutes.  Debra Jimenez and I discussed how to progress their exercise prescription.  The patient stated that their goals were to breathe better.  The patient stated that they understand the exercise prescription.  We reviewed exercise guidelines, target heart rate during exercise, RPE Scale, weather conditions, NTG use, endpoints for exercise, warmup and cool down.  Patient is encouraged to come to me with any questions. I will continue to follow up with the patient to assist them with progression and safety.

## 2021-04-22 ENCOUNTER — Encounter (HOSPITAL_COMMUNITY)
Admission: RE | Admit: 2021-04-22 | Discharge: 2021-04-22 | Disposition: A | Discharge status: Home / Self Care | Payer: Medicare Other | Source: Ambulatory Visit | Attending: Pulmonary Disease | Admitting: Pulmonary Disease

## 2021-04-22 DIAGNOSIS — J849 Interstitial pulmonary disease, unspecified: Secondary | ICD-10-CM

## 2021-04-22 NOTE — Progress Notes (Signed)
Daily Session Note  Patient Details  Name: Debra Jimenez MRN: [id number] Date of Birth: Sep 12, 1950 Referring Provider:   Flowsheet Row PULMONARY REHAB OTHER RESP ORIENTATION from 03/22/2021 in [place]  Referring Provider Dr. Elsworth Soho       Encounter Date: 04/22/2021  Check In:  Session Check In - 04/22/21 1330       Check-In   Supervising physician immediately available to respond to emergencies [facility] MD immediately available    Physician(s) Dr. Domenic Polite    Location [facility]    Staff Present Geanie Cooley, RN;Debra Wynetta Emery, RN, Bjorn Loser, MS, ACSM-CEP, Exercise Physiologist    Virtual Visit No    Medication changes reported     No    Fall or balance concerns reported    No    Tobacco Cessation No Change    Warm-up and Cool-down Performed as group-led instruction    Resistance Training Performed Yes    VAD Patient? No    PAD/SET Patient? No      Pain Assessment   Currently in Pain? No/denies    Multiple Pain Sites No             Capillary Blood Glucose: No results found for this or any previous visit (from the past 24 hour(s)).    Social History   Tobacco Use  Smoking Status Former   Packs/day: 0.50   Years: 15.00   Pack years: 7.50   Types: Cigarettes   Quit date: 1985   Years since quitting: 37.8  Smokeless Tobacco Never    Goals Met:  Proper associated with RPD/PD & O2 Sat Independence with exercise equipment Improved SOB with ADL's Exercise tolerated well No report of concerns or symptoms today Strength training completed today  Goals Unmet:  Not Applicable  Comments: check out @ 2:30pm   Dr. Kathie Dike is Medical Director for [facility].

## 2021-04-27 ENCOUNTER — Encounter (HOSPITAL_COMMUNITY): Payer: Medicare Other

## 2021-04-28 NOTE — Progress Notes (Signed)
Pulmonary Individual Treatment Plan  Patient Details  Name: Debra Jimenez MRN: [id number] Date of Birth: 10-16-1950 Referring Provider:   Flowsheet Row PULMONARY REHAB OTHER RESP ORIENTATION from 03/22/2021 in [facility] CARDIAC REHABILITATION  Referring Provider [name]       Initial Encounter Date:  Flowsheet Row PULMONARY REHAB OTHER RESP ORIENTATION from 03/22/2021 in [facility] CARDIAC REHABILITATION  Date 03/22/21       Visit Diagnosis: ILD (interstitial lung disease) (HCC)  Patient's Home Medications on Admission:   Current Outpatient Medications:    albuterol (VENTOLIN HFA) 108 (90 Base) MCG/ACT inhaler, Inhale 1 puff into the lungs as needed., Disp: , Rfl:    ALPRAZolam (XANAX) 0.25 MG tablet, Take 0.25 mg by mouth daily as needed., Disp: , Rfl:    ergocalciferol (VITAMIN D2) 1.25 MG (50000 UT) capsule, Take 50,000 Units by mouth once a week. Takes on Sunday, Disp: , Rfl:    ezetimibe (ZETIA) 10 MG tablet, Take 10 mg by mouth daily., Disp: , Rfl:    levothyroxine (SYNTHROID) 100 MCG tablet, Take 100 mcg by mouth daily before breakfast., Disp: , Rfl:    losartan (COZAAR) 50 MG tablet, Take 50 mg by mouth daily. (Patient not taking: Reported on 03/22/2021), Disp: , Rfl:    Nintedanib (OFEV) 100 MG CAPS, Take 1 capsule (100 mg total) by mouth 2 (two) times daily., Disp: 60 capsule, Rfl: 11   ondansetron (ZOFRAN) 4 MG tablet, Take 1 tablet (4 mg total) by mouth 2 (two) times daily as needed for nausea or vomiting., Disp: 30 tablet, Rfl: 1   zolpidem (AMBIEN) 5 MG tablet, Take 5 mg by mouth at bedtime as needed., Disp: , Rfl:   Past Medical History: Past Medical History:  Diagnosis Date   Hypothyroidism    OA (osteoarthritis)    RIGHT HIP AND LEFT HIP   Wears glasses     Tobacco Use: Social History   Tobacco Use  Smoking Status Former   Packs/day: 0.50   Years: 15.00   Pack years: 7.50   Types: Cigarettes   Quit date: 1985   Years since quitting: 37.8   Smokeless Tobacco Never    Labs: Recent Review Flowsheet Data   There is no flowsheet data to display.     Capillary Blood Glucose: No results found for: GLUCAP   Pulmonary Assessment Scores:  Pulmonary Assessment Scores     Row Name 03/22/21 1248 03/22/21 1249       ADL UCSD   SOB Score total 55 --      CAT Score   CAT Score 28 --      mMRC Score   mMRC Score -- 3            UCSD: Self-administered rating of dyspnea associated with activities of daily living (ADLs) 6-point scale (0 = "not at all" to 5 = "maximal or unable to do because of breathlessness")  Scoring Scores range from 0 to 120.  Minimally important difference is 5 units  CAT: CAT can identify the health impairment of COPD patients and is better correlated with disease progression.  CAT has a scoring range of zero to 40. The CAT score is classified into four groups of low (less than 10), medium (10 - 20), high (21-30) and very high (31-40) based on the impact level of disease on health status. A CAT score over 10 suggests significant symptoms.  A worsening CAT score could be explained by an exacerbation, poor medication adherence, poor inhaler  technique, or progression of COPD or comorbid conditions.  CAT MCID is 2 points  mMRC: mMRC (Modified Medical Research Council) Dyspnea Scale is used to assess the degree of baseline functional disability in patients of respiratory disease due to dyspnea. No minimal important difference is established. A decrease in score of 1 point or greater is considered a positive change.   Pulmonary Function Assessment:   Exercise Target Goals: Exercise Program Goal: Individual exercise prescription set using results from initial 6 min walk test and THRR while considering  patient's activity barriers and safety.   Exercise Prescription Goal: Initial exercise prescription builds to 30-45 minutes a day of aerobic activity, 2-3 days per week.  Home exercise guidelines  will be given to patient during program as part of exercise prescription that the participant will acknowledge.  Activity Barriers & Risk Stratification:  Activity Barriers & Cardiac Risk Stratification - 03/22/21 1246       Activity Barriers & Cardiac Risk Stratification   Activity Barriers Right Hip Replacement;Deconditioning;Shortness of Breath    Cardiac Risk Stratification Low             6 Minute Walk:  6 Minute Walk     Row Name 03/22/21 1414         6 Minute Walk   Phase Initial     Distance 1350 feet     Walk Time 6 minutes     # of Rest Breaks 0     MPH 2.6     METS 3.19     RPE 12     Perceived Dyspnea  15     VO2 Peak 11.16     Symptoms No     Resting HR 89 bpm     Resting BP 124/68     Resting Oxygen Saturation  96 %     Exercise Oxygen Saturation  during 6 min walk 92 %     Max Ex. HR 142 bpm     Max Ex. BP 140/75     2 Minute Post BP 135/70       Interval HR   1 Minute HR 133     2 Minute HR 136     3 Minute HR 141     4 Minute HR 141     5 Minute HR 142     6 Minute HR 143     2 Minute Post HR 103     Interval Heart Rate? Yes       Interval Oxygen   Interval Oxygen? Yes     Baseline Oxygen Saturation % 96 %     1 Minute Oxygen Saturation % 92 %     1 Minute Liters of Oxygen 0 L     2 Minute Oxygen Saturation % 91 %     2 Minute Liters of Oxygen 0 L     3 Minute Oxygen Saturation % 90 %     3 Minute Liters of Oxygen 0 L     4 Minute Oxygen Saturation % 90 %     4 Minute Liters of Oxygen 0 L     5 Minute Oxygen Saturation % 89 %     5 Minute Liters of Oxygen 0 L     6 Minute Oxygen Saturation % 88 %     6 Minute Liters of Oxygen 0 L     2 Minute Post Oxygen Saturation % 96 %     2 Minute Post Liters of  Oxygen 0 L              Oxygen Initial Assessment:  Oxygen Initial Assessment - 03/22/21 1425       Initial 6 min Walk   Oxygen Used None      Program Oxygen Prescription   Program Oxygen Prescription None       Intervention   Short Term Goals To learn and exhibit compliance with exercise, home and travel O2 prescription;To learn and understand importance of monitoring SPO2 with pulse oximeter and demonstrate accurate use of the pulse oximeter.;To learn and understand importance of maintaining oxygen saturations>88%;To learn and demonstrate proper pursed lip breathing techniques or other breathing techniques. ;To learn and demonstrate proper use of respiratory medications    Long  Term Goals Exhibits compliance with exercise, home  and travel O2 prescription;Verbalizes importance of monitoring SPO2 with pulse oximeter and return demonstration;Maintenance of O2 saturations>88%;Exhibits proper breathing techniques, such as pursed lip breathing or other method taught during program session;Compliance with respiratory medication;Demonstrates proper use of MDI's             Oxygen Re-Evaluation:  Oxygen Re-Evaluation     Row Name 03/30/21 1527 04/27/21 1313           Program Oxygen Prescription   Program Oxygen Prescription None None        Home Oxygen   Home Oxygen Device None None      Sleep Oxygen Prescription None None      Home Exercise Oxygen Prescription None None      Home Resting Oxygen Prescription None None      Compliance with Home Oxygen Use Yes Yes        Goals/Expected Outcomes   Short Term Goals To learn and exhibit compliance with exercise, home and travel O2 prescription;To learn and understand importance of maintaining oxygen saturations>88%;To learn and demonstrate proper pursed lip breathing techniques or other breathing techniques. ;To learn and demonstrate proper use of respiratory medications;To learn and understand importance of monitoring SPO2 with pulse oximeter and demonstrate accurate use of the pulse oximeter. To learn and exhibit compliance with exercise, home and travel O2 prescription;To learn and understand importance of maintaining oxygen saturations>88%;To learn  and demonstrate proper pursed lip breathing techniques or other breathing techniques. ;To learn and demonstrate proper use of respiratory medications;To learn and understand importance of monitoring SPO2 with pulse oximeter and demonstrate accurate use of the pulse oximeter.      Long  Term Goals Exhibits compliance with exercise, home  and travel O2 prescription;Verbalizes importance of monitoring SPO2 with pulse oximeter and return demonstration;Maintenance of O2 saturations>88%;Exhibits proper breathing techniques, such as pursed lip breathing or other method taught during program session;Compliance with respiratory medication;Demonstrates proper use of MDI's Exhibits compliance with exercise, home  and travel O2 prescription;Verbalizes importance of monitoring SPO2 with pulse oximeter and return demonstration;Maintenance of O2 saturations>88%;Exhibits proper breathing techniques, such as pursed lip breathing or other method taught during program session;Compliance with respiratory medication;Demonstrates proper use of MDI's      Goals/Expected Outcomes compliance compliance               Oxygen Discharge (Final Oxygen Re-Evaluation):  Oxygen Re-Evaluation - 04/27/21 1313       Program Oxygen Prescription   Program Oxygen Prescription None      Home Oxygen   Home Oxygen Device None    Sleep Oxygen Prescription None    Home Exercise Oxygen Prescription None    Home Resting Oxygen  Prescription None    Compliance with Home Oxygen Use Yes      Goals/Expected Outcomes   Short Term Goals To learn and exhibit compliance with exercise, home and travel O2 prescription;To learn and understand importance of maintaining oxygen saturations>88%;To learn and demonstrate proper pursed lip breathing techniques or other breathing techniques. ;To learn and demonstrate proper use of respiratory medications;To learn and understand importance of monitoring SPO2 with pulse oximeter and demonstrate accurate use  of the pulse oximeter.    Long  Term Goals Exhibits compliance with exercise, home  and travel O2 prescription;Verbalizes importance of monitoring SPO2 with pulse oximeter and return demonstration;Maintenance of O2 saturations>88%;Exhibits proper breathing techniques, such as pursed lip breathing or other method taught during program session;Compliance with respiratory medication;Demonstrates proper use of MDI's    Goals/Expected Outcomes compliance             Initial Exercise Prescription:  Initial Exercise Prescription - 03/22/21 1400       Date of Initial Exercise RX and Referring Provider   Date 03/22/21    Referring Provider [name]    Expected Discharge Date 08/12/21      Treadmill   MPH 1.2    Grade 0    Minutes 17    METs 0      NuStep   Level 1    SPM 60    Minutes 22    METs 0      Prescription Details   Frequency (times per week) 2    Duration Progress to 30 minutes of continuous aerobic without signs/symptoms of physical distress      Intensity   THRR 40-80% of Max Heartrate 60-120    Ratings of Perceived Exertion 11-13    Perceived Dyspnea 0-4      Progression   Progression Continue to progress workloads to maintain intensity without signs/symptoms of physical distress.      Resistance Training   Training Prescription Yes    Weight 3    Reps 10-15             Perform Capillary Blood Glucose checks as needed.  Exercise Prescription Changes:   Exercise Prescription Changes     Row Name 03/30/21 1500 04/08/21 1456 04/20/21 1400 04/22/21 1310       Response to Exercise   Blood Pressure (Admit) 120/60 120/60 -- 148/72    Blood Pressure (Exercise) 160/80 168/98 -- 168/90    Blood Pressure (Exit) 120/72 145/75 -- 138/88    Heart Rate (Admit) 90 bpm 90 bpm -- 88 bpm    Heart Rate (Exercise) 139 bpm 138 bpm -- 128 bpm    Heart Rate (Exit) 98 bpm 104 bpm -- 100 bpm    Oxygen Saturation (Admit) 96 % 96 % -- 99 %    Oxygen Saturation  (Exercise) 89 % 88 % -- 89 %    Oxygen Saturation (Exit) 96 % 94 % -- 96 %    Rating of Perceived Exertion (Exercise) 12 12 -- 12    Perceived Dyspnea (Exercise) 11 12 -- 12    Duration Continue with 30 min of aerobic exercise without signs/symptoms of physical distress. Continue with 30 min of aerobic exercise without signs/symptoms of physical distress. -- Continue with 30 min of aerobic exercise without signs/symptoms of physical distress.    Intensity THRR unchanged THRR unchanged -- THRR unchanged      Progression   Progression -- -- -- Continue to progress workloads to maintain intensity without signs/symptoms  of physical distress.      Resistance Training   Training Prescription Yes Yes -- Yes    Weight 3 3 lbs -- 3 lbs    Reps 10-15 10-15 -- 10-15    Time 10 Minutes 10 Minutes -- 10 Minutes      Treadmill   MPH 1.2 1.2 -- 1.3    Grade 0 0 -- 0    Minutes 17 17 -- 17    METs 1.92 1.92 -- 2      NuStep   Level 1 1 -- 2    SPM 61 91 -- 89    Minutes 22 22 -- 22    METs 1.8 1.9 -- 2      Home Exercise Plan   Plans to continue exercise at -- -- Home (comment) --    Frequency -- -- Add 2 additional days to program exercise sessions. --    Initial Home Exercises Provided -- -- 04/20/21 --             Exercise Comments:   Exercise Comments     Row Name 04/20/21 1439           Exercise Comments home exercise reviewed                Exercise Goals and Review:   Exercise Goals     Row Name 03/22/21 1421 03/30/21 1525           Exercise Goals   Increase Physical Activity Yes Yes      Intervention Provide advice, education, support and counseling about physical activity/exercise needs.;Develop an individualized exercise prescription for aerobic and resistive training based on initial evaluation findings, risk stratification, comorbidities and participant's personal goals. Provide advice, education, support and counseling about physical activity/exercise  needs.;Develop an individualized exercise prescription for aerobic and resistive training based on initial evaluation findings, risk stratification, comorbidities and participant's personal goals.      Expected Outcomes Short Term: Attend rehab on a regular basis to increase amount of physical activity.;Long Term: Add in home exercise to make exercise part of routine and to increase amount of physical activity.;Long Term: Exercising regularly at least 3-5 days a week. Short Term: Attend rehab on a regular basis to increase amount of physical activity.;Long Term: Add in home exercise to make exercise part of routine and to increase amount of physical activity.;Long Term: Exercising regularly at least 3-5 days a week.      Increase Strength and Stamina Yes Yes      Intervention Provide advice, education, support and counseling about physical activity/exercise needs.;Develop an individualized exercise prescription for aerobic and resistive training based on initial evaluation findings, risk stratification, comorbidities and participant's personal goals. Provide advice, education, support and counseling about physical activity/exercise needs.;Develop an individualized exercise prescription for aerobic and resistive training based on initial evaluation findings, risk stratification, comorbidities and participant's personal goals.      Expected Outcomes Short Term: Increase workloads from initial exercise prescription for resistance, speed, and METs.;Short Term: Perform resistance training exercises routinely during rehab and add in resistance training at home;Long Term: Improve cardiorespiratory fitness, muscular endurance and strength as measured by increased METs and functional capacity ( ) Short Term: Increase workloads from initial exercise prescription for resistance, speed, and METs.;Short Term: Perform resistance training exercises routinely during rehab and add in resistance training at home;Long Term:  Improve cardiorespiratory fitness, muscular endurance and strength as measured by increased METs and functional capacity ( )  Able to understand and use rate of perceived exertion (RPE) scale Yes Yes      Intervention Provide education and explanation on how to use RPE scale Provide education and explanation on how to use RPE scale      Expected Outcomes Short Term: Able to use RPE daily in rehab to express subjective intensity level;Long Term:  Able to use RPE to guide intensity level when exercising independently Short Term: Able to use RPE daily in rehab to express subjective intensity level;Long Term:  Able to use RPE to guide intensity level when exercising independently      Able to understand and use Dyspnea scale Yes Yes      Intervention Provide education and explanation on how to use Dyspnea scale Provide education and explanation on how to use Dyspnea scale      Expected Outcomes Short Term: Able to use Dyspnea scale daily in rehab to express subjective sense of shortness of breath during exertion;Long Term: Able to use Dyspnea scale to guide intensity level when exercising independently Short Term: Able to use Dyspnea scale daily in rehab to express subjective sense of shortness of breath during exertion;Long Term: Able to use Dyspnea scale to guide intensity level when exercising independently      Knowledge and understanding of Target Heart Rate Range (THRR) Yes Yes      Intervention Provide education and explanation of THRR including how the numbers were predicted and where they are located for reference Provide education and explanation of THRR including how the numbers were predicted and where they are located for reference      Expected Outcomes Short Term: Able to state/look up THRR;Short Term: Able to use daily as guideline for intensity in rehab;Long Term: Able to use THRR to govern intensity when exercising independently Short Term: Able to state/look up THRR;Short Term: Able to  use daily as guideline for intensity in rehab;Long Term: Able to use THRR to govern intensity when exercising independently      Understanding of Exercise Prescription Yes Yes      Intervention Provide education, explanation, and written materials on patient's individual exercise prescription Provide education, explanation, and written materials on patient's individual exercise prescription      Expected Outcomes Short Term: Able to explain program exercise prescription;Long Term: Able to explain home exercise prescription to exercise independently Short Term: Able to explain program exercise prescription;Long Term: Able to explain home exercise prescription to exercise independently               Exercise Goals Re-Evaluation :  Exercise Goals Re-Evaluation     Row Name 03/30/21 1525 04/27/21 1312           Exercise Goal Re-Evaluation   Exercise Goals Review Increase Physical Activity;Increase Strength and Stamina;Able to understand and use rate of perceived exertion (RPE) scale;Knowledge and understanding of Target Heart Rate Range (THRR);Understanding of Exercise Prescription;Able to understand and use Dyspnea scale Increase Physical Activity;Increase Strength and Stamina;Able to understand and use rate of perceived exertion (RPE) scale;Knowledge and understanding of Target Heart Rate Range (THRR);Understanding of Exercise Prescription;Able to understand and use Dyspnea scale      Comments Pt has completed her first session of cardiac rehab. She seems eager to be in the program and to get stronger. She is currently exercising at 1.92 METs on the TM. Will continue to monitor and progress as able. Pt has completed 6 sessions of pulmonary rehab. She continues to be motivated during class. She is currently  exercising at 2.0 METs on the stepper. Will contiunue to montior and progress as able.      Expected Outcomes Through exercise at rehab and at home, the patient will meet their stated goals.  Through exercise at rehab and at home, the patient will meet their stated goals.               Discharge Exercise Prescription (Final Exercise Prescription Changes):  Exercise Prescription Changes - 04/22/21 1310       Response to Exercise   Blood Pressure (Admit) 148/72    Blood Pressure (Exercise) 168/90    Blood Pressure (Exit) 138/88    Heart Rate (Admit) 88 bpm    Heart Rate (Exercise) 128 bpm    Heart Rate (Exit) 100 bpm    Oxygen Saturation (Admit) 99 %    Oxygen Saturation (Exercise) 89 %    Oxygen Saturation (Exit) 96 %    Rating of Perceived Exertion (Exercise) 12    Perceived Dyspnea (Exercise) 12    Duration Continue with 30 min of aerobic exercise without signs/symptoms of physical distress.    Intensity THRR unchanged      Progression   Progression Continue to progress workloads to maintain intensity without signs/symptoms of physical distress.      Resistance Training   Training Prescription Yes    Weight 3 lbs    Reps 10-15    Time 10 Minutes      Treadmill   MPH 1.3    Grade 0    Minutes 17    METs 2      NuStep   Level 2    SPM 89    Minutes 22    METs 2             Nutrition:  Target Goals: Understanding of nutrition guidelines, daily intake of sodium 1500mg , cholesterol 200mg , calories 30% from fat and 7% or less from saturated fats, daily to have 5 or more servings of fruits and vegetables.  Biometrics:  Pre Biometrics - 03/22/21 1422       Pre Biometrics   Height 5\' 5"  (1.651 m)    Weight 62.2 kg    Waist Circumference 34.5 inches    Hip Circumference 39 inches    Waist to Hip Ratio 0.88 %    BMI (Calculated) 22.82    Triceps Skinfold 14 mm    % Body Fat 32.8 %    Grip Strength 23.4 kg    Flexibility 9 in    Single Leg Stand 5 seconds              Nutrition Therapy Plan and Nutrition Goals:  Nutrition Therapy & Goals - 04/21/21 0857       Personal Nutrition Goals   Comments We offer 2 educational sessions on  heart heatlhy nutrition with handouts and offer assistance with RD referral if patient is interested.      Intervention Plan   Intervention Nutrition handout(s) given to patient.             Nutrition Assessments:  Nutrition Assessments - 03/22/21 1350       MEDFICTS Scores   Pre Score 70            MEDIFICTS Score Key: ?70 Need to make dietary changes  40-70 Heart Healthy Diet ? 40 Therapeutic Level Cholesterol Diet   Picture Your Plate Scores: 05/22/21 Unhealthy dietary pattern with much room for improvement. 41-50 Dietary pattern unlikely to meet recommendations for  good health and room for improvement. 51-60 More healthful dietary pattern, with some room for improvement.  >60 Healthy dietary pattern, although there may be some specific behaviors that could be improved.    Nutrition Goals Re-Evaluation:   Nutrition Goals Discharge (Final Nutrition Goals Re-Evaluation):   Psychosocial: Target Goals: Acknowledge presence or absence of significant depression and/or stress, maximize coping skills, provide positive support system. Participant is able to verbalize types and ability to use techniques and skills needed for reducing stress and depression.  Initial Review & Psychosocial Screening:  Initial Psych Review & Screening - 03/22/21 1250       Initial Review   Current issues with Current Anxiety/Panic;Current Sleep Concerns;Current Stress Concerns   on ambien for sleep and xanax PRN for anxiety   Source of Stress Concerns Chronic Illness    Comments She was diagnosed with ILD about a year ago and this causes her stress      Family Dynamics   Good Support System? Yes    Comments Her support system consists of her husband, her kids, and her sisters.      Barriers   Psychosocial barriers to participate in program The patient should benefit from training in stress management and relaxation.      Screening Interventions   Interventions Encouraged to exercise;To  provide support and resources with identified psychosocial needs;Provide feedback about the scores to participant    Expected Outcomes Long Term Goal: Stressors or current issues are controlled or eliminated.;Long Term goal: The participant improves quality of Life and PHQ9 Scores as seen by post scores and/or verbalization of changes             Quality of Life Scores:  Quality of Life - 03/22/21 1423       Quality of Life Scores   Health/Function Pre 20.75 %    Socioeconomic Pre 20.14 %    Psych/Spiritual Pre 21 %    Family Pre 21 %    GLOBAL Pre 20.71 %            Scores of 19 and below usually indicate a poorer quality of life in these areas.  A difference of  2-3 points is a clinically meaningful difference.  A difference of 2-3 points in the total score of the Quality of Life Index has been associated with significant improvement in overall quality of life, self-image, physical symptoms, and general health in studies assessing change in quality of life.   PHQ-9: Recent Review Flowsheet Data     Depression screen [facility] 2/9 03/22/2021   Decreased Interest 2   Down, Depressed, Hopeless 2   PHQ - 2 Score 4   Altered sleeping 2    Tired, decreased energy 2   Change in appetite 2   Feeling bad or failure about yourself  1   Trouble concentrating 2   Moving slowly or fidgety/restless 1   Suicidal thoughts 0   PHQ-9 Score 14   Difficult doing work/chores Somewhat difficult      Interpretation of Total Score  Total Score Depression Severity:  1-4 = Minimal depression, 5-9 = Mild depression, 10-14 = Moderate depression, 15-19 = Moderately severe depression, 20-27 = Severe depression   Psychosocial Evaluation and Intervention:  Psychosocial Evaluation - 03/22/21 1341       Psychosocial Evaluation & Interventions   Interventions Stress management education;Relaxation education;Encouraged to exercise with the program and follow exercise prescription    Comments Pt has  no barriers to completing cardiac rehab.  She takes ambien to help her sleep, but she reports that she still only gets about 5-6 per night. She also takes xanax PRN for her anxiety. She scored a 14 on her PHQ-9 and relates that mostly to not having much energy. Since she was diagnosed with ILD last year, she has not had much energy. She is no longer able to work outside in the yard like she used to, so this makes her feel down. Her health condition also causes her stress. She reports that at this time she is coping well on her own and is hopeful that the pulmonary rehab program will help her gain her stamina and give her social support. She reports a good support system with her husband, children, and sisters. Her goals in the program are to gain her strength and stamina back and to decrease her SOB with exertion. She is excited to start the program.    Expected Outcomes Pt will continue to take xanax for anxiety and ambien for sleep and no other psychosocial issues will be identified.    Continue Psychosocial Services  No Follow up required             Psychosocial Re-Evaluation:  Psychosocial Re-Evaluation     Row Name 03/24/21 0854 04/21/21 0905           Psychosocial Re-Evaluation   Current issues with Current Sleep Concerns;Current Stress Concerns Current Sleep Concerns;Current Stress Concerns      Comments Patient is new to the program starting soon. We will continue to montior her progress. Patient has completed 5 sessions and continues to have no psychosocial issues identified. She seems to enjoy coming to class and demonstrated a very positive outlook . She interacts well with staff and class. Will continue to monitor as she progresses in the program.      Expected Outcomes Patient will continue to have no psychosocial barriers identified. Patient will continue to have no psychosocial barriers identified.      Interventions -- Stress management education;Relaxation education;Encouraged  to attend Pulmonary Rehabilitation for the exercise      Continue Psychosocial Services  -- No Follow up required        Initial Review   Source of Stress Concerns Chronic Illness Chronic Illness      Comments She was diagnosed with ILD about a year ago and this causes her stress She was diagnosed with ILD about a year ago and this causes her stress               Psychosocial Discharge (Final Psychosocial Re-Evaluation):  Psychosocial Re-Evaluation - 04/21/21 0905       Psychosocial Re-Evaluation   Current issues with Current Sleep Concerns;Current Stress Concerns    Comments Patient has completed 5 sessions and continues to have no psychosocial issues identified. She seems to enjoy coming to class and demonstrated a very positive outlook . She interacts well with staff and class. Will continue to monitor as she progresses in the program.    Expected Outcomes Patient will continue to have no psychosocial barriers identified.    Interventions Stress management education;Relaxation education;Encouraged to attend Pulmonary Rehabilitation for the exercise    Continue Psychosocial Services  No Follow up required      Initial Review   Source of Stress Concerns Chronic Illness    Comments She was diagnosed with ILD about a year ago and this causes her stress              Education:  Education Goals: Education classes will be provided on a weekly basis, covering required topics. Participant will state understanding/return demonstration of topics presented.  Learning Barriers/Preferences:  Learning Barriers/Preferences - 03/22/21 1254       Learning Barriers/Preferences   Learning Barriers None    Learning Preferences Written Material;Audio             Education Topics: How Lungs Work and Diseases: - Discuss the anatomy of the lungs and diseases that can affect the lungs, such as COPD.   Exercise: -Discuss the importance of exercise, FITT principles of exercise, normal  and abnormal responses to exercise, and how to exercise safely.   Environmental Irritants: -Discuss types of environmental irritants and how to limit exposure to environmental irritants. Flowsheet Row PULMONARY REHAB OTHER RESPIRATORY from 04/22/2021 in [facility]  Date 04/01/21  Educator pb  Instruction Review Code 1- Verbalizes Understanding       Meds/Inhalers and oxygen: - Discuss respiratory medications, definition of an inhaler and oxygen, and the proper way to use an inhaler and oxygen. Flowsheet Row PULMONARY REHAB OTHER RESPIRATORY from 04/22/2021 in [facility]  Date 04/08/21  Educator hj       Energy Saving Techniques: - Discuss methods to conserve energy and decrease shortness of breath when performing activities of daily living.    Bronchial Hygiene / Breathing Techniques: - Discuss breathing mechanics, pursed-lip breathing technique,  proper posture, effective ways to clear airways, and other functional breathing techniques Flowsheet Row PULMONARY REHAB OTHER RESPIRATORY from 04/22/2021 in [facility]  Date 04/22/21  Educator pb  Instruction Review Code 1- Verbalizes Understanding       Cleaning Equipment: - Provides group verbal and written instruction about the health risks of elevated stress, cause of high stress, and healthy ways to reduce stress.   Nutrition I: Fats: - Discuss the types of cholesterol, what cholesterol does to the body, and how cholesterol levels can be controlled.   Nutrition II: Labels: -Discuss the different components of food labels and how to read food labels.   Respiratory Infections: - Discuss the signs and symptoms of respiratory infections, ways to prevent respiratory infections, and the importance of seeking medical treatment when having a respiratory infection.   Stress I: Signs and Symptoms: - Discuss the causes of stress, how stress may lead to anxiety  and depression, and ways to limit stress.   Stress II: Relaxation: -Discuss relaxation techniques to limit stress.   Oxygen for Home/Travel: - Discuss how to prepare for travel when on oxygen and proper ways to transport and store oxygen to ensure safety.   Knowledge Questionnaire Score:  Knowledge Questionnaire Score - 03/22/21 1255       Knowledge Questionnaire Score   Pre Score 14/18             Core Components/Risk Factors/Patient Goals at Admission:  Personal Goals and Risk Factors at Admission - 03/22/21 1259       Core Components/Risk Factors/Patient Goals on Admission    Weight Management Yes;Weight Maintenance    Intervention Weight Management: Develop a combined nutrition and exercise program designed to reach desired caloric intake, while maintaining appropriate intake of nutrient and fiber, sodium and fats, and appropriate energy expenditure required for the weight goal.;Weight Management: Provide education and appropriate resources to help participant work on and attain dietary goals.    Expected Outcomes Short Term: Continue to assess and modify interventions until short term weight is achieved;Long Term: Adherence to  nutrition and physical activity/exercise program aimed toward attainment of established weight goal;Weight Maintenance: Understanding of the daily nutrition guidelines, which includes 25-35% calories from fat, 7% or less cal from saturated fats, less than 200mg  cholesterol, less than 1.5gm of sodium, & 5 or more servings of fruits and vegetables daily    Improve shortness of breath with ADL's Yes    Intervention Provide education, individualized exercise plan and daily activity instruction to help decrease symptoms of SOB with activities of daily living.    Expected Outcomes Short Term: Improve cardiorespiratory fitness to achieve a reduction of symptoms when performing ADLs;Long Term: Be able to perform more ADLs without symptoms or delay the onset of  symptoms             Core Components/Risk Factors/Patient Goals Review:   Goals and Risk Factor Review     Row Name 03/24/21 0856 04/21/21 0911           Core Components/Risk Factors/Patient Goals Review   Personal Goals Review Weight Management/Obesity;Improve shortness of breath with ADL's;Other Improve shortness of breath with ADL's;Other      Review Patient referred to PR with ILD by Dr. 13/02/22. She has not started the program yet. Her personal goals for the program are to gain her strength and stamina; decrease her SOB; and be able to do her ADL's. We will continue to monitor her progress as she works towards meeting these goals. Patient as completed 5 sessions.  She continues to exercise well without oxygen maintaing O2 sats averages at 88-92%. Encourages pursed lipped breathing techniques and reviewed with her, demonstration understood.   Her personal goals for the program are to gain her strength and stamina; decrease her SOB; and be able to do her ADL's. We will continue to monitor her progress as she works towards meeting these goals.      Expected Outcomes Patient will complete the program meeting both program and personal goals. Patient will complete the program meeting both program and personal goals.               Core Components/Risk Factors/Patient Goals at Discharge (Final Review):   Goals and Risk Factor Review - 04/21/21 0911       Core Components/Risk Factors/Patient Goals Review   Personal Goals Review Improve shortness of breath with ADL's;Other    Review Patient as completed 5 sessions.  She continues to exercise well without oxygen maintaing O2 sats averages at 88-92%. Encourages pursed lipped breathing techniques and reviewed with her, demonstration understood.   Her personal goals for the program are to gain her strength and stamina; decrease her SOB; and be able to do her ADL's. We will continue to monitor her progress as she works towards meeting these  goals.    Expected Outcomes Patient will complete the program meeting both program and personal goals.             ITP Comments:   Comments: ITP REVIEW Pt is making expected progress toward pulmonary rehab goals after completing 7 sessions. Recommend continued exercise, life style modification, education, and utilization of breathing techniques to increase stamina and strength and decrease shortness of breath with exertion.

## 2021-04-29 ENCOUNTER — Encounter (HOSPITAL_COMMUNITY): Payer: Medicare Other

## 2021-04-30 ENCOUNTER — Encounter (HOSPITAL_COMMUNITY): Payer: Medicare Other

## 2021-05-04 ENCOUNTER — Encounter (HOSPITAL_COMMUNITY): Payer: Medicare Other

## 2021-05-05 ENCOUNTER — Telehealth: Payer: Self-pay | Admitting: Pulmonary Disease

## 2021-05-05 DIAGNOSIS — J849 Interstitial pulmonary disease, unspecified: Secondary | ICD-10-CM | POA: Diagnosis not present

## 2021-05-05 DIAGNOSIS — R059 Cough, unspecified: Secondary | ICD-10-CM | POA: Diagnosis not present

## 2021-05-05 DIAGNOSIS — Z20828 Contact with and (suspected) exposure to other viral communicable diseases: Secondary | ICD-10-CM | POA: Diagnosis not present

## 2021-05-05 DIAGNOSIS — Z6825 Body mass index (BMI) 25.0-25.9, adult: Secondary | ICD-10-CM | POA: Diagnosis not present

## 2021-05-05 NOTE — Telephone Encounter (Signed)
Primary Pulmonologist: [name]  Last office visit and with whom: 02/23/2021 [name]  What do we see them for (pulmonary problems): ILD Last OV assessment/plan: See below   Was appointment offered to patient (explain)?  No pt has an appt with PA at PCP office.    Reason for call: Pt has been having laryngitis.  Possible ear infection in left ear.  Dry cough and cold, sore throat.  No fever. No wheezing.  No covid test.  Requesting if something could be called in.   Allergies  Allergen Reactions   Atorvastatin Other (See Comments)     Muscle aches. Has tried 2 different statins and unable to tolerate    Immunization History  Administered Date(s) Administered   Fluad Quad(high Dose 65+) 06/27/2018   Influenza, High Dose Seasonal PF 04/05/2017   Influenza,inj,quad, With Preservative 02/19/2018   Moderna Sars-Covid-2 Vaccination 07/11/2019, 08/16/2019   Pneumococcal Conjugate-13 05/25/2016   Tdap 01/13/2010, 03/12/2018    Assessment & Plan:           Assessment & Plan Note by Oretha Milch, MD at 02/23/2021 2:45 PM  Author: Oretha Milch, MD Author Type: Physician Filed: 02/23/2021  2:47 PM  Note Status: [place] Cater: Cosign Not Required Encounter Date: 02/23/2021  Problem: ILD (interstitial lung disease) (HCC)  Editor: Oretha Milch, MD (Physician)      Prior Versions: 1. 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  She does have progression with decline in FVC of more than 5% suggesting that this is a progressive phenotype.  Unfortunately she has not tolerated full dose of Ofev and we have had to go with reduced dose.  We discussed options today including -Increasing Ofev to full dose of 150 -Trying alternative medication, pirfenidone which would have side effects of photosensitivity and nausea with increased pill burden -Adding pirfenidone to current dose of Ofev, no data that this would work and would  certainly compound side effects At this point they would like to minimize side effects Check LFTs today   Referral to pulmonary rehab

## 2021-05-05 NOTE — Telephone Encounter (Signed)
Called and spoke to patient she is going to get a z pak and prednisone from PCP filled form OV today. Advised her to call the office if she needed anything further. Nothing further needed at this time.

## 2021-05-06 ENCOUNTER — Encounter (HOSPITAL_COMMUNITY): Payer: Medicare Other

## 2021-05-11 ENCOUNTER — Encounter (HOSPITAL_COMMUNITY): Payer: Medicare Other

## 2021-05-13 ENCOUNTER — Encounter (HOSPITAL_COMMUNITY): Payer: Medicare Other

## 2021-05-17 NOTE — Addendum Note (Signed)
Encounter addended by: Tawni Millers, RN on: 05/17/2021 2:29 PM  Actions taken: Flowsheet data copied forward, Flowsheet accepted

## 2021-05-18 ENCOUNTER — Encounter (HOSPITAL_COMMUNITY): Payer: Medicare Other

## 2021-05-20 ENCOUNTER — Encounter (HOSPITAL_COMMUNITY): Payer: Medicare Other

## 2021-05-25 ENCOUNTER — Encounter (HOSPITAL_COMMUNITY): Payer: Medicare Other

## 2021-05-25 NOTE — Addendum Note (Signed)
Encounter addended by: Alisia Ferrari on: 05/25/2021 4:29 PM  Actions taken: Flowsheet data copied forward, Flowsheet accepted

## 2021-05-26 NOTE — Addendum Note (Signed)
Encounter addended by: Suann Larry, RN on: 05/26/2021 8:38 AM  Actions taken: Clinical Note Signed

## 2021-05-26 NOTE — Progress Notes (Signed)
Pulmonary Individual Treatment Plan  Patient Details  Name: Debra Jimenez MRN: [id number] Date of Birth: 1950/12/05 Referring Provider:   Flowsheet Row PULMONARY REHAB OTHER RESP ORIENTATION from 03/22/2021 in [facility] CARDIAC REHABILITATION  Referring Provider [name]       Initial Encounter Date:  Flowsheet Row PULMONARY REHAB OTHER RESP ORIENTATION from 03/22/2021 in [facility]  Date 03/22/21       Visit Diagnosis: ILD (interstitial lung disease) (HCC)  Patient's Home Medications on Admission:   Current Outpatient Medications:    albuterol (VENTOLIN HFA) 108 (90 Base) MCG/ACT inhaler, Inhale 1 puff into the lungs as needed., Disp: , Rfl:    ALPRAZolam (XANAX) 0.25 MG tablet, Take 0.25 mg by mouth daily as needed., Disp: , Rfl:    ergocalciferol (VITAMIN D2) 1.25 MG (50000 UT) capsule, Take 50,000 Units by mouth once a week. Takes on Sunday, Disp: , Rfl:    ezetimibe (ZETIA) 10 MG tablet, Take 10 mg by mouth daily., Disp: , Rfl:    levothyroxine (SYNTHROID) 100 MCG tablet, Take 100 mcg by mouth daily before breakfast., Disp: , Rfl:    losartan (COZAAR) 50 MG tablet, Take 50 mg by mouth daily. (Patient not taking: Reported on 03/22/2021), Disp: , Rfl:    Nintedanib (OFEV) 100 MG CAPS, Take 1 capsule (100 mg total) by mouth 2 (two) times daily., Disp: 60 capsule, Rfl: 11   ondansetron (ZOFRAN) 4 MG tablet, Take 1 tablet (4 mg total) by mouth 2 (two) times daily as needed for nausea or vomiting., Disp: 30 tablet, Rfl: 1   zolpidem (AMBIEN) 5 MG tablet, Take 5 mg by mouth at bedtime as needed., Disp: , Rfl:   Past Medical History: Past Medical History:  Diagnosis Date   Hypothyroidism    OA (osteoarthritis)    RIGHT HIP AND LEFT HIP   Wears glasses     Tobacco Use: Social History   Tobacco Use  Smoking Status Former   Packs/day: 0.50   Years: 15.00   Pack years: 7.50   Types: Cigarettes   Quit date: 1985   Years since quitting: 37.9   Smokeless Tobacco Never    Labs: Recent Review Flowsheet Data   There is no flowsheet data to display.     Capillary Blood Glucose: No results found for: GLUCAP   Pulmonary Assessment Scores:  Pulmonary Assessment Scores     Row Name 03/22/21 1248 03/22/21 1249       ADL UCSD   SOB Score total 55 --      CAT Score   CAT Score 28 --      mMRC Score   mMRC Score -- 3            UCSD: Self-administered rating of dyspnea associated with activities of daily living (ADLs) 6-point scale (0 = "not at all" to 5 = "maximal or unable to do because of breathlessness")  Scoring Scores range from 0 to 120.  Minimally important difference is 5 units  CAT: CAT can identify the health impairment of COPD patients and is better correlated with disease progression.  CAT has a scoring range of zero to 40. The CAT score is classified into four groups of low (less than 10), medium (10 - 20), high (21-30) and very high (31-40) based on the impact level of disease on health status. A CAT score over 10 suggests significant symptoms.  A worsening CAT score could be explained by an exacerbation, poor medication adherence, poor inhaler  technique, or progression of COPD or comorbid conditions.  CAT MCID is 2 points  mMRC: mMRC (Modified Medical Research Council) Dyspnea Scale is used to assess the degree of baseline functional disability in patients of respiratory disease due to dyspnea. No minimal important difference is established. A decrease in score of 1 point or greater is considered a positive change.   Pulmonary Function Assessment:   Exercise Target Goals: Exercise Program Goal: Individual exercise prescription set using results from initial 6 min walk test and THRR while considering  patient's activity barriers and safety.   Exercise Prescription Goal: Initial exercise prescription builds to 30-45 minutes a day of aerobic activity, 2-3 days per week.  Home exercise guidelines  will be given to patient during program as part of exercise prescription that the participant will acknowledge.  Activity Barriers & Risk Stratification:  Activity Barriers & Cardiac Risk Stratification - 03/22/21 1246       Activity Barriers & Cardiac Risk Stratification   Activity Barriers Right Hip Replacement;Deconditioning;Shortness of Breath    Cardiac Risk Stratification Low             6 Minute Walk:  6 Minute Walk     Row Name 03/22/21 1414         6 Minute Walk   Phase Initial     Distance 1350 feet     Walk Time 6 minutes     # of Rest Breaks 0     MPH 2.6     METS 3.19     RPE 12     Perceived Dyspnea  15     VO2 Peak 11.16     Symptoms No     Resting HR 89 bpm     Resting BP 124/68     Resting Oxygen Saturation  96 %     Exercise Oxygen Saturation  during 6 min walk 92 %     Max Ex. HR 142 bpm     Max Ex. BP 140/75     2 Minute Post BP 135/70       Interval HR   1 Minute HR 133     2 Minute HR 136     3 Minute HR 141     4 Minute HR 141     5 Minute HR 142     6 Minute HR 143     2 Minute Post HR 103     Interval Heart Rate? Yes       Interval Oxygen   Interval Oxygen? Yes     Baseline Oxygen Saturation % 96 %     1 Minute Oxygen Saturation % 92 %     1 Minute Liters of Oxygen 0 L     2 Minute Oxygen Saturation % 91 %     2 Minute Liters of Oxygen 0 L     3 Minute Oxygen Saturation % 90 %     3 Minute Liters of Oxygen 0 L     4 Minute Oxygen Saturation % 90 %     4 Minute Liters of Oxygen 0 L     5 Minute Oxygen Saturation % 89 %     5 Minute Liters of Oxygen 0 L     6 Minute Oxygen Saturation % 88 %     6 Minute Liters of Oxygen 0 L     2 Minute Post Oxygen Saturation % 96 %     2 Minute Post Liters of  Oxygen 0 L              Oxygen Initial Assessment:  Oxygen Initial Assessment - 03/22/21 1425       Initial 6 min Walk   Oxygen Used None      Program Oxygen Prescription   Program Oxygen Prescription None       Intervention   Short Term Goals To learn and exhibit compliance with exercise, home and travel O2 prescription;To learn and understand importance of monitoring SPO2 with pulse oximeter and demonstrate accurate use of the pulse oximeter.;To learn and understand importance of maintaining oxygen saturations>88%;To learn and demonstrate proper pursed lip breathing techniques or other breathing techniques. ;To learn and demonstrate proper use of respiratory medications    Long  Term Goals Exhibits compliance with exercise, home  and travel O2 prescription;Verbalizes importance of monitoring SPO2 with pulse oximeter and return demonstration;Maintenance of O2 saturations>88%;Exhibits proper breathing techniques, such as pursed lip breathing or other method taught during program session;Compliance with respiratory medication;Demonstrates proper use of MDI's             Oxygen Re-Evaluation:  Oxygen Re-Evaluation     Row Name 03/30/21 1527 04/27/21 1313           Program Oxygen Prescription   Program Oxygen Prescription None None        Home Oxygen   Home Oxygen Device None None      Sleep Oxygen Prescription None None      Home Exercise Oxygen Prescription None None      Home Resting Oxygen Prescription None None      Compliance with Home Oxygen Use Yes Yes        Goals/Expected Outcomes   Short Term Goals To learn and exhibit compliance with exercise, home and travel O2 prescription;To learn and understand importance of maintaining oxygen saturations>88%;To learn and demonstrate proper pursed lip breathing techniques or other breathing techniques. ;To learn and demonstrate proper use of respiratory medications;To learn and understand importance of monitoring SPO2 with pulse oximeter and demonstrate accurate use of the pulse oximeter. To learn and exhibit compliance with exercise, home and travel O2 prescription;To learn and understand importance of maintaining oxygen saturations>88%;To learn  and demonstrate proper pursed lip breathing techniques or other breathing techniques. ;To learn and demonstrate proper use of respiratory medications;To learn and understand importance of monitoring SPO2 with pulse oximeter and demonstrate accurate use of the pulse oximeter.      Long  Term Goals Exhibits compliance with exercise, home  and travel O2 prescription;Verbalizes importance of monitoring SPO2 with pulse oximeter and return demonstration;Maintenance of O2 saturations>88%;Exhibits proper breathing techniques, such as pursed lip breathing or other method taught during program session;Compliance with respiratory medication;Demonstrates proper use of MDI's Exhibits compliance with exercise, home  and travel O2 prescription;Verbalizes importance of monitoring SPO2 with pulse oximeter and return demonstration;Maintenance of O2 saturations>88%;Exhibits proper breathing techniques, such as pursed lip breathing or other method taught during program session;Compliance with respiratory medication;Demonstrates proper use of MDI's      Goals/Expected Outcomes compliance compliance               Oxygen Discharge (Final Oxygen Re-Evaluation):  Oxygen Re-Evaluation - 04/27/21 1313       Program Oxygen Prescription   Program Oxygen Prescription None      Home Oxygen   Home Oxygen Device None    Sleep Oxygen Prescription None    Home Exercise Oxygen Prescription None    Home Resting Oxygen  Prescription None    Compliance with Home Oxygen Use Yes      Goals/Expected Outcomes   Short Term Goals To learn and exhibit compliance with exercise, home and travel O2 prescription;To learn and understand importance of maintaining oxygen saturations>88%;To learn and demonstrate proper pursed lip breathing techniques or other breathing techniques. ;To learn and demonstrate proper use of respiratory medications;To learn and understand importance of monitoring SPO2 with pulse oximeter and demonstrate accurate use  of the pulse oximeter.    Long  Term Goals Exhibits compliance with exercise, home  and travel O2 prescription;Verbalizes importance of monitoring SPO2 with pulse oximeter and return demonstration;Maintenance of O2 saturations>88%;Exhibits proper breathing techniques, such as pursed lip breathing or other method taught during program session;Compliance with respiratory medication;Demonstrates proper use of MDI's    Goals/Expected Outcomes compliance             Initial Exercise Prescription:  Initial Exercise Prescription - 03/22/21 1400       Date of Initial Exercise RX and Referring Provider   Date 03/22/21    Referring Provider [name]    Expected Discharge Date 08/12/21      Treadmill   MPH 1.2    Grade 0    Minutes 17    METs 0      NuStep   Level 1    SPM 60    Minutes 22    METs 0      Prescription Details   Frequency (times per week) 2    Duration Progress to 30 minutes of continuous aerobic without signs/symptoms of physical distress      Intensity   THRR 40-80% of Max Heartrate 60-120    Ratings of Perceived Exertion 11-13    Perceived Dyspnea 0-4      Progression   Progression Continue to progress workloads to maintain intensity without signs/symptoms of physical distress.      Resistance Training   Training Prescription Yes    Weight 3    Reps 10-15             Perform Capillary Blood Glucose checks as needed.  Exercise Prescription Changes:   Exercise Prescription Changes     Row Name 03/30/21 1500 04/08/21 1456 04/20/21 1400 04/22/21 1310       Response to Exercise   Blood Pressure (Admit) 120/60 120/60 -- 148/72    Blood Pressure (Exercise) 160/80 168/98 -- 168/90    Blood Pressure (Exit) 120/72 145/75 -- 138/88    Heart Rate (Admit) 90 bpm 90 bpm -- 88 bpm    Heart Rate (Exercise) 139 bpm 138 bpm -- 128 bpm    Heart Rate (Exit) 98 bpm 104 bpm -- 100 bpm    Oxygen Saturation (Admit) 96 % 96 % -- 99 %    Oxygen Saturation  (Exercise) 89 % 88 % -- 89 %    Oxygen Saturation (Exit) 96 % 94 % -- 96 %    Rating of Perceived Exertion (Exercise) 12 12 -- 12    Perceived Dyspnea (Exercise) 11 12 -- 12    Duration Continue with 30 min of aerobic exercise without signs/symptoms of physical distress. Continue with 30 min of aerobic exercise without signs/symptoms of physical distress. -- Continue with 30 min of aerobic exercise without signs/symptoms of physical distress.    Intensity THRR unchanged THRR unchanged -- THRR unchanged      Progression   Progression -- -- -- Continue to progress workloads to maintain intensity without signs/symptoms  of physical distress.      Resistance Training   Training Prescription Yes Yes -- Yes    Weight 3 3 lbs -- 3 lbs    Reps 10-15 10-15 -- 10-15    Time 10 Minutes 10 Minutes -- 10 Minutes      Treadmill   MPH 1.2 1.2 -- 1.3    Grade 0 0 -- 0    Minutes 17 17 -- 17    METs 1.92 1.92 -- 2      NuStep   Level 1 1 -- 2    SPM 61 91 -- 89    Minutes 22 22 -- 22    METs 1.8 1.9 -- 2      Home Exercise Plan   Plans to continue exercise at -- -- Home (comment) --    Frequency -- -- Add 2 additional days to program exercise sessions. --    Initial Home Exercises Provided -- -- 04/20/21 --             Exercise Comments:   Exercise Comments     Row Name 04/20/21 1439           Exercise Comments home exercise reviewed                Exercise Goals and Review:   Exercise Goals     Row Name 03/22/21 1421 03/30/21 1525 05/25/21 1628         Exercise Goals   Increase Physical Activity Yes Yes Yes     Intervention Provide advice, education, support and counseling about physical activity/exercise needs.;Develop an individualized exercise prescription for aerobic and resistive training based on initial evaluation findings, risk stratification, comorbidities and participant's personal goals. Provide advice, education, support and counseling about physical  activity/exercise needs.;Develop an individualized exercise prescription for aerobic and resistive training based on initial evaluation findings, risk stratification, comorbidities and participant's personal goals. Provide advice, education, support and counseling about physical activity/exercise needs.;Develop an individualized exercise prescription for aerobic and resistive training based on initial evaluation findings, risk stratification, comorbidities and participant's personal goals.     Expected Outcomes Short Term: Attend rehab on a regular basis to increase amount of physical activity.;Long Term: Add in home exercise to make exercise part of routine and to increase amount of physical activity.;Long Term: Exercising regularly at least 3-5 days a week. Short Term: Attend rehab on a regular basis to increase amount of physical activity.;Long Term: Add in home exercise to make exercise part of routine and to increase amount of physical activity.;Long Term: Exercising regularly at least 3-5 days a week. Short Term: Attend rehab on a regular basis to increase amount of physical activity.;Long Term: Add in home exercise to make exercise part of routine and to increase amount of physical activity.;Long Term: Exercising regularly at least 3-5 days a week.     Increase Strength and Stamina Yes Yes Yes     Intervention Provide advice, education, support and counseling about physical activity/exercise needs.;Develop an individualized exercise prescription for aerobic and resistive training based on initial evaluation findings, risk stratification, comorbidities and participant's personal goals. Provide advice, education, support and counseling about physical activity/exercise needs.;Develop an individualized exercise prescription for aerobic and resistive training based on initial evaluation findings, risk stratification, comorbidities and participant's personal goals. Provide advice, education, support and  counseling about physical activity/exercise needs.;Develop an individualized exercise prescription for aerobic and resistive training based on initial evaluation findings, risk stratification, comorbidities and participant's personal goals.  Expected Outcomes Short Term: Increase workloads from initial exercise prescription for resistance, speed, and METs.;Short Term: Perform resistance training exercises routinely during rehab and add in resistance training at home;Long Term: Improve cardiorespiratory fitness, muscular endurance and strength as measured by increased METs and functional capacity ( ) Short Term: Increase workloads from initial exercise prescription for resistance, speed, and METs.;Short Term: Perform resistance training exercises routinely during rehab and add in resistance training at home;Long Term: Improve cardiorespiratory fitness, muscular endurance and strength as measured by increased METs and functional capacity ( ) Short Term: Increase workloads from initial exercise prescription for resistance, speed, and METs.;Short Term: Perform resistance training exercises routinely during rehab and add in resistance training at home;Long Term: Improve cardiorespiratory fitness, muscular endurance and strength as measured by increased METs and functional capacity ( )     Able to understand and use rate of perceived exertion (RPE) scale Yes Yes Yes     Intervention Provide education and explanation on how to use RPE scale Provide education and explanation on how to use RPE scale Provide education and explanation on how to use RPE scale     Expected Outcomes Short Term: Able to use RPE daily in rehab to express subjective intensity level;Long Term:  Able to use RPE to guide intensity level when exercising independently Short Term: Able to use RPE daily in rehab to express subjective intensity level;Long Term:  Able to use RPE to guide intensity level when exercising independently Short  Term: Able to use RPE daily in rehab to express subjective intensity level;Long Term:  Able to use RPE to guide intensity level when exercising independently     Able to understand and use Dyspnea scale Yes Yes Yes     Intervention Provide education and explanation on how to use Dyspnea scale Provide education and explanation on how to use Dyspnea scale Provide education and explanation on how to use Dyspnea scale     Expected Outcomes Short Term: Able to use Dyspnea scale daily in rehab to express subjective sense of shortness of breath during exertion;Long Term: Able to use Dyspnea scale to guide intensity level when exercising independently Short Term: Able to use Dyspnea scale daily in rehab to express subjective sense of shortness of breath during exertion;Long Term: Able to use Dyspnea scale to guide intensity level when exercising independently Short Term: Able to use Dyspnea scale daily in rehab to express subjective sense of shortness of breath during exertion;Long Term: Able to use Dyspnea scale to guide intensity level when exercising independently     Knowledge and understanding of Target Heart Rate Range (THRR) Yes Yes Yes     Intervention Provide education and explanation of THRR including how the numbers were predicted and where they are located for reference Provide education and explanation of THRR including how the numbers were predicted and where they are located for reference Provide education and explanation of THRR including how the numbers were predicted and where they are located for reference     Expected Outcomes Short Term: Able to state/look up THRR;Short Term: Able to use daily as guideline for intensity in rehab;Long Term: Able to use THRR to govern intensity when exercising independently Short Term: Able to state/look up THRR;Short Term: Able to use daily as guideline for intensity in rehab;Long Term: Able to use THRR to govern intensity when exercising independently Short Term:  Able to state/look up THRR;Short Term: Able to use daily as guideline for intensity in rehab;Long Term: Able to use THRR to govern  intensity when exercising independently     Understanding of Exercise Prescription Yes Yes Yes     Intervention Provide education, explanation, and written materials on patient's individual exercise prescription Provide education, explanation, and written materials on patient's individual exercise prescription Provide education, explanation, and written materials on patient's individual exercise prescription     Expected Outcomes Short Term: Able to explain program exercise prescription;Long Term: Able to explain home exercise prescription to exercise independently Short Term: Able to explain program exercise prescription;Long Term: Able to explain home exercise prescription to exercise independently Short Term: Able to explain program exercise prescription;Long Term: Able to explain home exercise prescription to exercise independently              Exercise Goals Re-Evaluation :  Exercise Goals Re-Evaluation     Row Name 03/30/21 1525 04/27/21 1312 05/25/21 1628         Exercise Goal Re-Evaluation   Exercise Goals Review Increase Physical Activity;Increase Strength and Stamina;Able to understand and use rate of perceived exertion (RPE) scale;Knowledge and understanding of Target Heart Rate Range (THRR);Understanding of Exercise Prescription;Able to understand and use Dyspnea scale Increase Physical Activity;Increase Strength and Stamina;Able to understand and use rate of perceived exertion (RPE) scale;Knowledge and understanding of Target Heart Rate Range (THRR);Understanding of Exercise Prescription;Able to understand and use Dyspnea scale Increase Physical Activity;Increase Strength and Stamina;Able to understand and use rate of perceived exertion (RPE) scale;Knowledge and understanding of Target Heart Rate Range (THRR);Understanding of Exercise Prescription;Able to  understand and use Dyspnea scale     Comments Pt has completed her first session of cardiac rehab. She seems eager to be in the program and to get stronger. She is currently exercising at 1.92 METs on the TM. Will continue to monitor and progress as able. Pt has completed 6 sessions of pulmonary rehab. She continues to be motivated during class. She is currently exercising at 2.0 METs on the stepper. Will contiunue to montior and progress as able. Pt last attended rehab on 04/22/2021 due to being sick. She will likely be discharged from the program and encouraged to get a new referral when she feels able.     Expected Outcomes Through exercise at rehab and at home, the patient will meet their stated goals. Through exercise at rehab and at home, the patient will meet their stated goals. Through exercise at rehab and at home, the patient will meet their stated goals.              Discharge Exercise Prescription (Final Exercise Prescription Changes):  Exercise Prescription Changes - 04/22/21 1310       Response to Exercise   Blood Pressure (Admit) 148/72    Blood Pressure (Exercise) 168/90    Blood Pressure (Exit) 138/88    Heart Rate (Admit) 88 bpm    Heart Rate (Exercise) 128 bpm    Heart Rate (Exit) 100 bpm    Oxygen Saturation (Admit) 99 %    Oxygen Saturation (Exercise) 89 %    Oxygen Saturation (Exit) 96 %    Rating of Perceived Exertion (Exercise) 12    Perceived Dyspnea (Exercise) 12    Duration Continue with 30 min of aerobic exercise without signs/symptoms of physical distress.    Intensity THRR unchanged      Progression   Progression Continue to progress workloads to maintain intensity without signs/symptoms of physical distress.      Resistance Training   Training Prescription Yes    Weight 3 lbs  Reps 10-15    Time 10 Minutes      Treadmill   MPH 1.3    Grade 0    Minutes 17    METs 2      NuStep   Level 2    SPM 89    Minutes 22    METs 2              Nutrition:  Target Goals: Understanding of nutrition guidelines, daily intake of sodium 1500mg , cholesterol 200mg , calories 30% from fat and 7% or less from saturated fats, daily to have 5 or more servings of fruits and vegetables.  Biometrics:  Pre Biometrics - 03/22/21 1422       Pre Biometrics   Height 5\' 5"  (1.651 m)    Weight 62.2 kg    Waist Circumference 34.5 inches    Hip Circumference 39 inches    Waist to Hip Ratio 0.88 %    BMI (Calculated) 22.82    Triceps Skinfold 14 mm    % Body Fat 32.8 %    Grip Strength 23.4 kg    Flexibility 9 in    Single Leg Stand 5 seconds              Nutrition Therapy Plan and Nutrition Goals:  Nutrition Therapy & Goals - 05/17/21 1424       Personal Nutrition Goals   Comments We offer 2 educational sessions on heart heatlhy nutrition with handouts and offer assistance with RD referral if patient is interested.      Intervention Plan   Intervention Nutrition handout(s) given to patient.             Nutrition Assessments:  Nutrition Assessments - 03/22/21 1350       MEDFICTS Scores   Pre Score 70            MEDIFICTS Score Key: ?70 Need to make dietary changes  40-70 Heart Healthy Diet ? 40 Therapeutic Level Cholesterol Diet   Picture Your Plate Scores: <16 Unhealthy dietary pattern with much room for improvement. 41-50 Dietary pattern unlikely to meet recommendations for good health and room for improvement. 51-60 More healthful dietary pattern, with some room for improvement.  >60 Healthy dietary pattern, although there may be some specific behaviors that could be improved.    Nutrition Goals Re-Evaluation:   Nutrition Goals Discharge (Final Nutrition Goals Re-Evaluation):   Psychosocial: Target Goals: Acknowledge presence or absence of significant depression and/or stress, maximize coping skills, provide positive support system. Participant is able to verbalize types and ability to use  techniques and skills needed for reducing stress and depression.  Initial Review & Psychosocial Screening:  Initial Psych Review & Screening - 03/22/21 1250       Initial Review   Current issues with Current Anxiety/Panic;Current Sleep Concerns;Current Stress Concerns   on ambien for sleep and xanax PRN for anxiety   Source of Stress Concerns Chronic Illness    Comments She was diagnosed with ILD about a year ago and this causes her stress      Family Dynamics   Good Support System? Yes    Comments Her support system consists of her husband, her kids, and her sisters.      Barriers   Psychosocial barriers to participate in program The patient should benefit from training in stress management and relaxation.      Screening Interventions   Interventions Encouraged to exercise;To provide support and resources with identified psychosocial needs;Provide feedback about  the scores to participant    Expected Outcomes Long Term Goal: Stressors or current issues are controlled or eliminated.;Long Term goal: The participant improves quality of Life and PHQ9 Scores as seen by post scores and/or verbalization of changes             Quality of Life Scores:  Quality of Life - 03/22/21 1423       Quality of Life Scores   Health/Function Pre 20.75 %    Socioeconomic Pre 20.14 %    Psych/Spiritual Pre 21 %    Family Pre 21 %    GLOBAL Pre 20.71 %            Scores of 19 and below usually indicate a poorer quality of life in these areas.  A difference of  2-3 points is a clinically meaningful difference.  A difference of 2-3 points in the total score of the Quality of Life Index has been associated with significant improvement in overall quality of life, self-image, physical symptoms, and general health in studies assessing change in quality of life.   PHQ-9: Recent Review Flowsheet Data     Depression screen [facility] 2/9 03/22/2021   Decreased Interest 2   Down, Depressed, Hopeless 2    PHQ - 2 Score 4   Altered sleeping 2    Tired, decreased energy 2   Change in appetite 2   Feeling bad or failure about yourself  1   Trouble concentrating 2   Moving slowly or fidgety/restless 1   Suicidal thoughts 0   PHQ-9 Score 14   Difficult doing work/chores Somewhat difficult      Interpretation of Total Score  Total Score Depression Severity:  1-4 = Minimal depression, 5-9 = Mild depression, 10-14 = Moderate depression, 15-19 = Moderately severe depression, 20-27 = Severe depression   Psychosocial Evaluation and Intervention:  Psychosocial Evaluation - 03/22/21 1341       Psychosocial Evaluation & Interventions   Interventions Stress management education;Relaxation education;Encouraged to exercise with the program and follow exercise prescription    Comments Pt has no barriers to completing cardiac rehab. She takes ambien to help her sleep, but she reports that she still only gets about 5-6 per night. She also takes xanax PRN for her anxiety. She scored a 14 on her PHQ-9 and relates that mostly to not having much energy. Since she was diagnosed with ILD last year, she has not had much energy. She is no longer able to work outside in the yard like she used to, so this makes her feel down. Her health condition also causes her stress. She reports that at this time she is coping well on her own and is hopeful that the pulmonary rehab program will help her gain her stamina and give her social support. She reports a good support system with her husband, children, and sisters. Her goals in the program are to gain her strength and stamina back and to decrease her SOB with exertion. She is excited to start the program.    Expected Outcomes Pt will continue to take xanax for anxiety and ambien for sleep and no other psychosocial issues will be identified.    Continue Psychosocial Services  No Follow up required             Psychosocial Re-Evaluation:  Psychosocial Re-Evaluation      Row Name 03/24/21 0854 04/21/21 0905 05/17/21 1412         Psychosocial Re-Evaluation   Current issues  with Current Sleep Concerns;Current Stress Concerns Current Sleep Concerns;Current Stress Concerns Current Sleep Concerns;Current Stress Concerns     Comments Patient is new to the program starting soon. We will continue to montior her progress. Patient has completed 5 sessions and continues to have no psychosocial issues identified. She seems to enjoy coming to class and demonstrated a very positive outlook . She interacts well with staff and class. Will continue to monitor as she progresses in the program. Patient has completed 6 sessions and continues to have no psychosocial issues identified. She has been out of town and now is out due to Laryngitis. Started on z pak and prednisone from PCP.  Will continue to monitor as she progresses in the program.     Expected Outcomes Patient will continue to have no psychosocial barriers identified. Patient will continue to have no psychosocial barriers identified. Patient will continue to have no psychosocial barriers identified.     Interventions -- Stress management education;Relaxation education;Encouraged to attend Pulmonary Rehabilitation for the exercise Stress management education;Relaxation education;Encouraged to attend Pulmonary Rehabilitation for the exercise     Continue Psychosocial Services  -- No Follow up required No Follow up required       Initial Review   Source of Stress Concerns Chronic Illness Chronic Illness Chronic Illness     Comments She was diagnosed with ILD about a year ago and this causes her stress She was diagnosed with ILD about a year ago and this causes her stress She was diagnosed with ILD about a year ago and this causes her stress              Psychosocial Discharge (Final Psychosocial Re-Evaluation):  Psychosocial Re-Evaluation - 05/17/21 1412       Psychosocial Re-Evaluation   Current issues with Current  Sleep Concerns;Current Stress Concerns    Comments Patient has completed 6 sessions and continues to have no psychosocial issues identified. She has been out of town and now is out due to Laryngitis. Started on z pak and prednisone from PCP.  Will continue to monitor as she progresses in the program.    Expected Outcomes Patient will continue to have no psychosocial barriers identified.    Interventions Stress management education;Relaxation education;Encouraged to attend Pulmonary Rehabilitation for the exercise    Continue Psychosocial Services  No Follow up required      Initial Review   Source of Stress Concerns Chronic Illness    Comments She was diagnosed with ILD about a year ago and this causes her stress              Education: Education Goals: Education classes will be provided on a weekly basis, covering required topics. Participant will state understanding/return demonstration of topics presented.  Learning Barriers/Preferences:  Learning Barriers/Preferences - 03/22/21 1254       Learning Barriers/Preferences   Learning Barriers None    Learning Preferences Written Material;Audio             Education Topics: How Lungs Work and Diseases: - Discuss the anatomy of the lungs and diseases that can affect the lungs, such as COPD.   Exercise: -Discuss the importance of exercise, FITT principles of exercise, normal and abnormal responses to exercise, and how to exercise safely.   Environmental Irritants: -Discuss types of environmental irritants and how to limit exposure to environmental irritants. Flowsheet Row PULMONARY REHAB OTHER RESPIRATORY from 04/22/2021 in [place] [facility]  Date 04/01/21  Educator pb  Instruction Review Code 1-  Verbalizes Understanding       Meds/Inhalers and oxygen: - Discuss respiratory medications, definition of an inhaler and oxygen, and the proper way to use an inhaler and oxygen. Flowsheet Row PULMONARY REHAB  OTHER RESPIRATORY from 04/22/2021 in [place] [facility]  Date 04/08/21  Educator hj       Energy Saving Techniques: - Discuss methods to conserve energy and decrease shortness of breath when performing activities of daily living.    Bronchial Hygiene / Breathing Techniques: - Discuss breathing mechanics, pursed-lip breathing technique,  proper posture, effective ways to clear airways, and other functional breathing techniques Flowsheet Row PULMONARY REHAB OTHER RESPIRATORY from 04/22/2021 in [place] [facility]  Date 04/22/21  Educator pb  Instruction Review Code 1- Verbalizes Understanding       Cleaning Equipment: - Provides group verbal and written instruction about the health risks of elevated stress, cause of high stress, and healthy ways to reduce stress.   Nutrition I: Fats: - Discuss the types of cholesterol, what cholesterol does to the body, and how cholesterol levels can be controlled.   Nutrition II: Labels: -Discuss the different components of food labels and how to read food labels.   Respiratory Infections: - Discuss the signs and symptoms of respiratory infections, ways to prevent respiratory infections, and the importance of seeking medical treatment when having a respiratory infection.   Stress I: Signs and Symptoms: - Discuss the causes of stress, how stress may lead to anxiety and depression, and ways to limit stress.   Stress II: Relaxation: -Discuss relaxation techniques to limit stress.   Oxygen for Home/Travel: - Discuss how to prepare for travel when on oxygen and proper ways to transport and store oxygen to ensure safety.   Knowledge Questionnaire Score:  Knowledge Questionnaire Score - 03/22/21 1255       Knowledge Questionnaire Score   Pre Score 14/18             Core Components/Risk Factors/Patient Goals at Admission:  Personal Goals and Risk Factors at Admission - 03/22/21 1259       Core  Components/Risk Factors/Patient Goals on Admission    Weight Management Yes;Weight Maintenance    Intervention Weight Management: Develop a combined nutrition and exercise program designed to reach desired caloric intake, while maintaining appropriate intake of nutrient and fiber, sodium and fats, and appropriate energy expenditure required for the weight goal.;Weight Management: Provide education and appropriate resources to help participant work on and attain dietary goals.    Expected Outcomes Short Term: Continue to assess and modify interventions until short term weight is achieved;Long Term: Adherence to nutrition and physical activity/exercise program aimed toward attainment of established weight goal;Weight Maintenance: Understanding of the daily nutrition guidelines, which includes 25-35% calories from fat, 7% or less cal from saturated fats, less than 200mg  cholesterol, less than 1.5gm of sodium, & 5 or more servings of fruits and vegetables daily    Improve shortness of breath with ADL's Yes    Intervention Provide education, individualized exercise plan and daily activity instruction to help decrease symptoms of SOB with activities of daily living.    Expected Outcomes Short Term: Improve cardiorespiratory fitness to achieve a reduction of symptoms when performing ADLs;Long Term: Be able to perform more ADLs without symptoms or delay the onset of symptoms             Core Components/Risk Factors/Patient Goals Review:   Goals and Risk Factor Review     Row Name 03/24/21 [phone]  04/21/21 0911 05/17/21 1424         Core Components/Risk Factors/Patient Goals Review   Personal Goals Review Weight Management/Obesity;Improve shortness of breath with ADL's;Other Improve shortness of breath with ADL's;Other --     Review Patient referred to PR with ILD by [name]. She has not started the program yet. Her personal goals for the program are to gain her strength and stamina; decrease her SOB;  and be able to do her ADL's. We will continue to monitor her progress as she works towards meeting these goals. Patient as completed 5 sessions.  She continues to exercise well without oxygen maintaing O2 sats averages at 88-92%. Encourages pursed lipped breathing techniques and reviewed with her, demonstration understood.   Her personal goals for the program are to gain her strength and stamina; decrease her SOB; and be able to do her ADL's. We will continue to monitor her progress as she works towards meeting these goals. Patient has completed 6 sessions. She has been out of town and now is out due to Laryngitis.  Has started on z pak and prednisone.   Her personal goals for the program are to gain her strength and stamina; decrease her SOB; and be able to do her ADL's. We will continue to monitor her progress as she works towards meeting these goals.     Expected Outcomes Patient will complete the program meeting both program and personal goals. Patient will complete the program meeting both program and personal goals. Patient will complete the program meeting both program and personal goals.              Core Components/Risk Factors/Patient Goals at Discharge (Final Review):   Goals and Risk Factor Review - 05/17/21 1424       Core Components/Risk Factors/Patient Goals Review   Review Patient has completed 6 sessions. She has been out of town and now is out due to Laryngitis.  Has started on z pak and prednisone.   Her personal goals for the program are to gain her strength and stamina; decrease her SOB; and be able to do her ADL's. We will continue to monitor her progress as she works towards meeting these goals.    Expected Outcomes Patient will complete the program meeting both program and personal goals.             ITP Comments:   Comments: ITP REVIEW Patient has not attended since Nov 3. Plan to discharge patient soon due to lack of attendance.

## 2021-05-27 ENCOUNTER — Encounter (HOSPITAL_COMMUNITY): Payer: Medicare Other

## 2021-05-31 NOTE — Progress Notes (Signed)
Discharge Progress Report  Patient Details  Name: Debra Jimenez MRN: [id number] Date of Birth: 1950-08-20 Referring Provider:   Flowsheet Row PULMONARY REHAB OTHER RESP ORIENTATION from 03/22/2021 in [facility] CARDIAC REHABILITATION  Referring Provider [name]        Number of Visits: 7  Reason for Discharge:  Early Exit:  Pt has been sick and unable to attend.  Smoking History:  Social History   Tobacco Use  Smoking Status Former   Packs/day: 0.50   Years: 15.00   Pack years: 7.50   Types: Cigarettes   Quit date: 1985   Years since quitting: 37.9  Smokeless Tobacco Never    Diagnosis:  ILD (interstitial lung disease) (HCC)  ADL [facility]:  Pulmonary Assessment Scores     Row Name 03/22/21 1248 03/22/21 1249       ADL [facility]   SOB Score total 55 --      CAT Score   CAT Score 28 --      mMRC Score   mMRC Score -- 3             Initial Exercise Prescription:  Initial Exercise Prescription - 03/22/21 1400       Date of Initial Exercise RX and Referring Provider   Date 03/22/21    Referring Provider [name]    Expected Discharge Date 08/12/21      Treadmill   MPH 1.2    Grade 0    Minutes 17    METs 0      NuStep   Level 1    SPM 60    Minutes 22    METs 0      Prescription Details   Frequency (times per week) 2    Duration Progress to 30 minutes of continuous aerobic without signs/symptoms of physical distress      Intensity   THRR 40-80% of Max Heartrate 60-120    Ratings of Perceived Exertion 11-13    Perceived Dyspnea 0-4      Progression   Progression Continue to progress workloads to maintain intensity without signs/symptoms of physical distress.      Resistance Training   Training Prescription Yes    Weight 3    Reps 10-15             Discharge Exercise Prescription (Final Exercise Prescription Changes):  Exercise Prescription Changes - 04/22/21 1310       Response to Exercise   Blood Pressure (Admit) 148/72     Blood Pressure (Exercise) 168/90    Blood Pressure (Exit) 138/88    Heart Rate (Admit) 88 bpm    Heart Rate (Exercise) 128 bpm    Heart Rate (Exit) 100 bpm    Oxygen Saturation (Admit) 99 %    Oxygen Saturation (Exercise) 89 %    Oxygen Saturation (Exit) 96 %    Rating of Perceived Exertion (Exercise) 12    Perceived Dyspnea (Exercise) 12    Duration Continue with 30 min of aerobic exercise without signs/symptoms of physical distress.    Intensity THRR unchanged      Progression   Progression Continue to progress workloads to maintain intensity without signs/symptoms of physical distress.      Resistance Training   Training Prescription Yes    Weight 3 lbs    Reps 10-15    Time 10 Minutes      Treadmill   MPH 1.3    Grade 0    Minutes 17  METs 2      NuStep   Level 2    SPM 89    Minutes 22    METs 2             Functional Capacity:  6 Minute Walk     Row Name 03/22/21 1414         6 Minute Walk   Phase Initial     Distance 1350 feet     Walk Time 6 minutes     # of Rest Breaks 0     MPH 2.6     METS 3.19     RPE 12     Perceived Dyspnea  15     VO2 Peak 11.16     Symptoms No     Resting HR 89 bpm     Resting BP 124/68     Resting Oxygen Saturation  96 %     Exercise Oxygen Saturation  during 6 min walk 92 %     Max Ex. HR 142 bpm     Max Ex. BP 140/75     2 Minute Post BP 135/70       Interval HR   1 Minute HR 133     2 Minute HR 136     3 Minute HR 141     4 Minute HR 141     5 Minute HR 142     6 Minute HR 143     2 Minute Post HR 103     Interval Heart Rate? Yes       Interval Oxygen   Interval Oxygen? Yes     Baseline Oxygen Saturation % 96 %     1 Minute Oxygen Saturation % 92 %     1 Minute Liters of Oxygen 0 L     2 Minute Oxygen Saturation % 91 %     2 Minute Liters of Oxygen 0 L     3 Minute Oxygen Saturation % 90 %     3 Minute Liters of Oxygen 0 L     4 Minute Oxygen Saturation % 90 %     4 Minute Liters of Oxygen 0 L      5 Minute Oxygen Saturation % 89 %     5 Minute Liters of Oxygen 0 L     6 Minute Oxygen Saturation % 88 %     6 Minute Liters of Oxygen 0 L     2 Minute Post Oxygen Saturation % 96 %     2 Minute Post Liters of Oxygen 0 L              Psychological, QOL, Others - Outcomes: PHQ 2/9: Depression screen PHQ 2/9 03/22/2021  Decreased Interest 2  Down, Depressed, Hopeless 2  PHQ - 2 Score 4  Altered sleeping 2  Tired, decreased energy 2  Change in appetite 2  Feeling bad or failure about yourself  1  Trouble concentrating 2  Moving slowly or fidgety/restless 1  Suicidal thoughts 0  PHQ-9 Score 14  Difficult doing work/chores Somewhat difficult    Quality of Life:  Quality of Life - 03/22/21 1423       Quality of Life Scores   Health/Function Pre 20.75 %    Socioeconomic Pre 20.14 %    Psych/Spiritual Pre 21 %    Family Pre 21 %    GLOBAL Pre 20.71 %  Personal Goals: Goals established at orientation with interventions provided to work toward goal.  Personal Goals and Risk Factors at Admission - 03/22/21 1259       Core Components/Risk Factors/Patient Goals on Admission    Weight Management Yes;Weight Maintenance    Intervention Weight Management: Develop a combined nutrition and exercise program designed to reach desired caloric intake, while maintaining appropriate intake of nutrient and fiber, sodium and fats, and appropriate energy expenditure required for the weight goal.;Weight Management: Provide education and appropriate resources to help participant work on and attain dietary goals.    Expected Outcomes Short Term: Continue to assess and modify interventions until short term weight is achieved;Long Term: Adherence to nutrition and physical activity/exercise program aimed toward attainment of established weight goal;Weight Maintenance: Understanding of the daily nutrition guidelines, which includes 25-35% calories from fat, 7% or less cal from  saturated fats, less than 200mg  cholesterol, less than 1.5gm of sodium, & 5 or more servings of fruits and vegetables daily    Improve shortness of breath with ADL's Yes    Intervention Provide education, individualized exercise plan and daily activity instruction to help decrease symptoms of SOB with activities of daily living.    Expected Outcomes Short Term: Improve cardiorespiratory fitness to achieve a reduction of symptoms when performing ADLs;Long Term: Be able to perform more ADLs without symptoms or delay the onset of symptoms              Personal Goals Discharge:  Goals and Risk Factor Review     Row Name 03/24/21 0856 04/21/21 0911 05/17/21 1424         Core Components/Risk Factors/Patient Goals Review   Personal Goals Review Weight Management/Obesity;Improve shortness of breath with ADL's;Other Improve shortness of breath with ADL's;Other --     Review Patient referred to PR with ILD by Dr. 05/19/21. She has not started the program yet. Her personal goals for the program are to gain her strength and stamina; decrease her SOB; and be able to do her ADL's. We will continue to monitor her progress as she works towards meeting these goals. Patient as completed 5 sessions.  She continues to exercise well without oxygen maintaing O2 sats averages at 88-92%. Encourages pursed lipped breathing techniques and reviewed with her, demonstration understood.   Her personal goals for the program are to gain her strength and stamina; decrease her SOB; and be able to do her ADL's. We will continue to monitor her progress as she works towards meeting these goals. Patient has completed 6 sessions. She has been out of town and now is out due to Laryngitis.  Has started on z pak and prednisone.   Her personal goals for the program are to gain her strength and stamina; decrease her SOB; and be able to do her ADL's. We will continue to monitor her progress as she works towards meeting these goals.      Expected Outcomes Patient will complete the program meeting both program and personal goals. Patient will complete the program meeting both program and personal goals. Patient will complete the program meeting both program and personal goals.              Exercise Goals and Review:  Exercise Goals     Row Name 03/22/21 1421 03/30/21 1525 05/25/21 1628         Exercise Goals   Increase Physical Activity Yes Yes Yes     Intervention Provide advice, education, support and counseling about physical  activity/exercise needs.;Develop an individualized exercise prescription for aerobic and resistive training based on initial evaluation findings, risk stratification, comorbidities and participant's personal goals. Provide advice, education, support and counseling about physical activity/exercise needs.;Develop an individualized exercise prescription for aerobic and resistive training based on initial evaluation findings, risk stratification, comorbidities and participant's personal goals. Provide advice, education, support and counseling about physical activity/exercise needs.;Develop an individualized exercise prescription for aerobic and resistive training based on initial evaluation findings, risk stratification, comorbidities and participant's personal goals.     Expected Outcomes Short Term: Attend rehab on a regular basis to increase amount of physical activity.;Long Term: Add in home exercise to make exercise part of routine and to increase amount of physical activity.;Long Term: Exercising regularly at least 3-5 days a week. Short Term: Attend rehab on a regular basis to increase amount of physical activity.;Long Term: Add in home exercise to make exercise part of routine and to increase amount of physical activity.;Long Term: Exercising regularly at least 3-5 days a week. Short Term: Attend rehab on a regular basis to increase amount of physical activity.;Long Term: Add in home exercise to make  exercise part of routine and to increase amount of physical activity.;Long Term: Exercising regularly at least 3-5 days a week.     Increase Strength and Stamina Yes Yes Yes     Intervention Provide advice, education, support and counseling about physical activity/exercise needs.;Develop an individualized exercise prescription for aerobic and resistive training based on initial evaluation findings, risk stratification, comorbidities and participant's personal goals. Provide advice, education, support and counseling about physical activity/exercise needs.;Develop an individualized exercise prescription for aerobic and resistive training based on initial evaluation findings, risk stratification, comorbidities and participant's personal goals. Provide advice, education, support and counseling about physical activity/exercise needs.;Develop an individualized exercise prescription for aerobic and resistive training based on initial evaluation findings, risk stratification, comorbidities and participant's personal goals.     Expected Outcomes Short Term: Increase workloads from initial exercise prescription for resistance, speed, and METs.;Short Term: Perform resistance training exercises routinely during rehab and add in resistance training at home;Long Term: Improve cardiorespiratory fitness, muscular endurance and strength as measured by increased METs and functional capacity ( ) Short Term: Increase workloads from initial exercise prescription for resistance, speed, and METs.;Short Term: Perform resistance training exercises routinely during rehab and add in resistance training at home;Long Term: Improve cardiorespiratory fitness, muscular endurance and strength as measured by increased METs and functional capacity ( ) Short Term: Increase workloads from initial exercise prescription for resistance, speed, and METs.;Short Term: Perform resistance training exercises routinely during rehab and add in resistance  training at home;Long Term: Improve cardiorespiratory fitness, muscular endurance and strength as measured by increased METs and functional capacity ( )     Able to understand and use rate of perceived exertion (RPE) scale Yes Yes Yes     Intervention Provide education and explanation on how to use RPE scale Provide education and explanation on how to use RPE scale Provide education and explanation on how to use RPE scale     Expected Outcomes Short Term: Able to use RPE daily in rehab to express subjective intensity level;Long Term:  Able to use RPE to guide intensity level when exercising independently Short Term: Able to use RPE daily in rehab to express subjective intensity level;Long Term:  Able to use RPE to guide intensity level when exercising independently Short Term: Able to use RPE daily in rehab to express subjective intensity level;Long Term:  Able to use RPE to  guide intensity level when exercising independently     Able to understand and use Dyspnea scale Yes Yes Yes     Intervention Provide education and explanation on how to use Dyspnea scale Provide education and explanation on how to use Dyspnea scale Provide education and explanation on how to use Dyspnea scale     Expected Outcomes Short Term: Able to use Dyspnea scale daily in rehab to express subjective sense of shortness of breath during exertion;Long Term: Able to use Dyspnea scale to guide intensity level when exercising independently Short Term: Able to use Dyspnea scale daily in rehab to express subjective sense of shortness of breath during exertion;Long Term: Able to use Dyspnea scale to guide intensity level when exercising independently Short Term: Able to use Dyspnea scale daily in rehab to express subjective sense of shortness of breath during exertion;Long Term: Able to use Dyspnea scale to guide intensity level when exercising independently     Knowledge and understanding of Target Heart Rate Range (THRR) Yes Yes Yes      Intervention Provide education and explanation of THRR including how the numbers were predicted and where they are located for reference Provide education and explanation of THRR including how the numbers were predicted and where they are located for reference Provide education and explanation of THRR including how the numbers were predicted and where they are located for reference     Expected Outcomes Short Term: Able to state/look up THRR;Short Term: Able to use daily as guideline for intensity in rehab;Long Term: Able to use THRR to govern intensity when exercising independently Short Term: Able to state/look up THRR;Short Term: Able to use daily as guideline for intensity in rehab;Long Term: Able to use THRR to govern intensity when exercising independently Short Term: Able to state/look up THRR;Short Term: Able to use daily as guideline for intensity in rehab;Long Term: Able to use THRR to govern intensity when exercising independently     Understanding of Exercise Prescription Yes Yes Yes     Intervention Provide education, explanation, and written materials on patient's individual exercise prescription Provide education, explanation, and written materials on patient's individual exercise prescription Provide education, explanation, and written materials on patient's individual exercise prescription     Expected Outcomes Short Term: Able to explain program exercise prescription;Long Term: Able to explain home exercise prescription to exercise independently Short Term: Able to explain program exercise prescription;Long Term: Able to explain home exercise prescription to exercise independently Short Term: Able to explain program exercise prescription;Long Term: Able to explain home exercise prescription to exercise independently              Exercise Goals Re-Evaluation:  Exercise Goals Re-Evaluation     Row Name 03/30/21 1525 04/27/21 1312 05/25/21 1628         Exercise Goal Re-Evaluation    Exercise Goals Review Increase Physical Activity;Increase Strength and Stamina;Able to understand and use rate of perceived exertion (RPE) scale;Knowledge and understanding of Target Heart Rate Range (THRR);Understanding of Exercise Prescription;Able to understand and use Dyspnea scale Increase Physical Activity;Increase Strength and Stamina;Able to understand and use rate of perceived exertion (RPE) scale;Knowledge and understanding of Target Heart Rate Range (THRR);Understanding of Exercise Prescription;Able to understand and use Dyspnea scale Increase Physical Activity;Increase Strength and Stamina;Able to understand and use rate of perceived exertion (RPE) scale;Knowledge and understanding of Target Heart Rate Range (THRR);Understanding of Exercise Prescription;Able to understand and use Dyspnea scale     Comments Pt has completed her first session  of cardiac rehab. She seems eager to be in the program and to get stronger. She is currently exercising at 1.92 METs on the TM. Will continue to monitor and progress as able. Pt has completed 6 sessions of pulmonary rehab. She continues to be motivated during class. She is currently exercising at 2.0 METs on the stepper. Will contiunue to montior and progress as able. Pt last attended rehab on 04/22/2021 due to being sick. She will likely be discharged from the program and encouraged to get a new referral when she feels able.     Expected Outcomes Through exercise at rehab and at home, the patient will meet their stated goals. Through exercise at rehab and at home, the patient will meet their stated goals. Through exercise at rehab and at home, the patient will meet their stated goals.              Nutrition & Weight - Outcomes:  Pre Biometrics - 03/22/21 1422       Pre Biometrics   Height  (1.651 m)    Weight 137 lb 2 oz (62.2 kg)    Waist Circumference 34.5 inches    Hip Circumference 39 inches    Waist to Hip Ratio 0.88 %    BMI  (Calculated) 22.82    Triceps Skinfold 14 mm    % Body Fat 32.8 %    Grip Strength 23.4 kg    Flexibility 9 in    Single Leg Stand 5 seconds              Nutrition:  Nutrition Therapy & Goals - 05/17/21 1424       Personal Nutrition Goals   Comments We offer 2 educational sessions on heart heatlhy nutrition with handouts and offer assistance with RD referral if patient is interested.      Intervention Plan   Intervention Nutrition handout(s) given to patient.             Nutrition Discharge:  Nutrition Assessments - 03/22/21 1350       MEDFICTS Scores   Pre Score 70             Education Questionnaire Score:  Knowledge Questionnaire Score - 03/22/21 1255       Knowledge Questionnaire Score   Pre Score 14/18             Pt discharged from pulmonary rehab after 7 sessions on 05/27/2021. She has been sick and unable to attend since early November. I encouraged her to get another referral when she is feeling better.

## 2021-05-31 NOTE — Addendum Note (Signed)
Encounter addended by: Alisia Ferrari on: 05/31/2021 2:45 PM  Actions taken: Episode resolved, Flowsheet accepted, Clinical Note Signed

## 2021-06-01 ENCOUNTER — Encounter (HOSPITAL_COMMUNITY): Payer: Medicare Other

## 2021-06-03 ENCOUNTER — Encounter (HOSPITAL_COMMUNITY): Payer: Medicare Other

## 2021-06-08 ENCOUNTER — Encounter (HOSPITAL_COMMUNITY): Payer: Medicare Other

## 2021-06-10 ENCOUNTER — Encounter (HOSPITAL_COMMUNITY): Payer: Medicare Other

## 2021-06-15 ENCOUNTER — Encounter (HOSPITAL_COMMUNITY): Payer: Medicare Other

## 2021-06-17 ENCOUNTER — Encounter (HOSPITAL_COMMUNITY): Payer: Medicare Other

## 2021-06-22 ENCOUNTER — Encounter (HOSPITAL_COMMUNITY): Payer: Medicare Other

## 2021-06-24 ENCOUNTER — Encounter (HOSPITAL_COMMUNITY): Payer: Medicare Other

## 2021-06-29 ENCOUNTER — Encounter (HOSPITAL_COMMUNITY): Payer: Medicare Other

## 2021-07-01 ENCOUNTER — Encounter (HOSPITAL_COMMUNITY): Payer: Medicare Other

## 2021-07-06 ENCOUNTER — Encounter (HOSPITAL_COMMUNITY): Payer: Medicare Other

## 2021-07-07 ENCOUNTER — Encounter: Payer: Self-pay | Admitting: Pulmonary Disease

## 2021-07-07 ENCOUNTER — Other Ambulatory Visit: Payer: Self-pay

## 2021-07-07 ENCOUNTER — Ambulatory Visit: Payer: Medicare Other | Admitting: Pulmonary Disease

## 2021-07-07 VITALS — BP 142/88 | HR 74 | Temp 98.7°F | Ht 65.0 in | Wt 138.0 lb

## 2021-07-07 DIAGNOSIS — J849 Interstitial pulmonary disease, unspecified: Secondary | ICD-10-CM | POA: Diagnosis not present

## 2021-07-07 MED ORDER — ALBUTEROL SULFATE HFA 108 (90 BASE) MCG/ACT IN AERS: 1.0000 | INHALATION_SPRAY | RESPIRATORY_TRACT | 11 refills | Status: DC | PRN

## 2021-07-07 NOTE — Addendum Note (Signed)
Addended by: Carleene Mains D on: 07/07/2021 03:56 PM   Modules accepted: Orders

## 2021-07-07 NOTE — Assessment & Plan Note (Signed)
Plan for 1 year follow-up HRCT in May/2023. Will look for disease progression. We will recheck PFTs.  TLC was stable but FVC is low and DLCO seem to have dropped  I do not think she will tolerate higher dose/150 mg of Ofev.  She is already having difficulties with 100 mg we will continue at this dose and check LFTs to monitor toxicity. I will asked her to enroll in pulmonary rehab again

## 2021-07-07 NOTE — Progress Notes (Signed)
° °  Subjective:    Patient ID: Debra Jimenez, female    DOB: Jan 13, 1951, 71 y.o.   MRN: [id number]  HPI  71 yo remote smoker for follow-up of ILD, symptom onset since 2019 -New onset hypertension on lisinopril   She had 10% drop in FVC from 20 19-20 21, however TLC and DLCO are stable compared to 2019 >> progressive phenotype.  Developed significant nausea with Ofev and this was decreased  to 71 mg twice daily   Chief Complaint  Patient presents with   Follow-up    Patient states she was sick in nov. In 2022 and was sick for a few weeks. Patient was weak and fell. Breathing has been about the same since last ov.    71-month follow-up visit, accompanied by her husband. She had a viral infection around Thanksgiving, took Z-Pak which really messed up her stomach.  She felt very weak and on 1 occasion her legs gave out and she fell down, no loss of consciousness.  She subsequently regained her strength and is back on ofev 100 mg twice daily  She is able to tolerate diarrhea Breathing is otherwise unchanged.  She was enrolled in pulmonary rehab but had to disengage over the last 2 months  Reviewed prior PFTs and HRCT  Significant tests/ events reviewed  HRCT 10/2020 >> c/w UIP, increased compared to 2021, unchanged prominent LNs     HRCT 10/2019 "probable" UIP , basilar predominance, early honeycombing , mild mediastinal lymphadenopathy   PFTs 02/2021 -FVC 1.26/40%, TLC 3.45/66%, DLCO 7.83/38%   PFTs 12/2019 -FVC 1.47/46%, TLC 3.49/67%, DLCO 12.2/60%   PFTs 12/2017  -[facility] -moderate intraparenchymal restriction, ratio 88, FVC 56%/1.70, DLCO 8.7/44%, TLC 3.21/64% Chest x-ray 3/29-bilateral interstitial changes       Review of Systems neg for any significant sore throat, dysphagia, itching, sneezing, nasal congestion or excess/ purulent secretions, fever, chills, sweats, unintended wt loss, pleuritic or exertional cp, hempoptysis, orthopnea pnd or change in chronic leg swelling.  Also denies presyncope, palpitations, heartburn, abdominal pain, nausea, vomiting, diarrhea or change in bowel or urinary habits, dysuria,hematuria, rash, arthralgias, visual complaints, headache, numbness weakness or ataxia.     Objective:   Physical Exam  Gen. Pleasant, well-nourished, in no distress ENT - no thrush, no pallor/icterus,no post nasal drip Neck: No JVD, no thyromegaly, no carotid bruits Lungs: no use of accessory muscles, no dullness to percussion, bibasal dry rales without rales or rhonchi  Cardiovascular: Rhythm regular, heart sounds  normal, no murmurs or gallops, no peripheral edema Musculoskeletal: No deformities, no cyanosis or clubbing        Assessment & Plan:

## 2021-07-07 NOTE — Patient Instructions (Signed)
°  X schedule PFTs in April  X HRCT chest may 2022  X enroll back in pulm rehab

## 2021-07-08 ENCOUNTER — Encounter (HOSPITAL_COMMUNITY): Payer: Medicare Other

## 2021-07-08 ENCOUNTER — Telehealth: Payer: Self-pay

## 2021-07-08 LAB — HEPATIC FUNCTION PANEL
ALT: 12 IU/L (ref 0–32)
AST: 18 IU/L (ref 0–40)
Albumin: 4.7 g/dL (ref 3.8–4.8)
Alkaline Phosphatase: 82 IU/L (ref 44–121)
Bilirubin Total: 0.6 mg/dL (ref 0.0–1.2)
Bilirubin, Direct: 0.13 mg/dL (ref 0.00–0.40)
Total Protein: 7.2 g/dL (ref 6.0–8.5)

## 2021-07-08 NOTE — Telephone Encounter (Signed)
Informed patient of fax received from [facility] cares regarding denial of enrollment because of private drug coverage. Patient is going to call number provided on the fax [phone] for more information.   States she received a call from pulm rehab program at [facility]. Her new ins is not going to cover the cost of rehab and she has decided not to participate in it for that reason.   Will route to Dr. Elsworth Soho to make aware of refusal of pulm rehab and reasoning.

## 2021-07-13 ENCOUNTER — Encounter (HOSPITAL_COMMUNITY): Payer: Medicare Other

## 2021-07-15 ENCOUNTER — Encounter (HOSPITAL_COMMUNITY): Payer: Medicare Other

## 2021-07-20 ENCOUNTER — Encounter (HOSPITAL_COMMUNITY): Payer: Medicare Other

## 2021-07-22 ENCOUNTER — Encounter (HOSPITAL_COMMUNITY): Payer: Medicare Other

## 2021-07-27 ENCOUNTER — Encounter (HOSPITAL_COMMUNITY): Payer: Medicare Other

## 2021-07-29 ENCOUNTER — Encounter (HOSPITAL_COMMUNITY): Payer: Medicare Other

## 2021-08-04 ENCOUNTER — Telehealth: Payer: Self-pay | Admitting: Pulmonary Disease

## 2021-08-06 NOTE — Telephone Encounter (Signed)
Encounter open in error so closing encounter. °

## 2021-08-09 ENCOUNTER — Other Ambulatory Visit: Payer: Self-pay

## 2021-08-09 ENCOUNTER — Encounter: Payer: Self-pay | Admitting: Nurse Practitioner

## 2021-08-09 ENCOUNTER — Ambulatory Visit (INDEPENDENT_AMBULATORY_CARE_PROVIDER_SITE_OTHER): Payer: Medicare Other

## 2021-08-09 ENCOUNTER — Ambulatory Visit: Payer: Medicare Other | Admitting: Nurse Practitioner

## 2021-08-09 VITALS — BP 160/110 | HR 95 | Temp 97.6°F | Ht 65.0 in | Wt 134.8 lb

## 2021-08-09 DIAGNOSIS — R079 Chest pain, unspecified: Secondary | ICD-10-CM

## 2021-08-09 DIAGNOSIS — J069 Acute upper respiratory infection, unspecified: Secondary | ICD-10-CM | POA: Diagnosis not present

## 2021-08-09 DIAGNOSIS — J849 Interstitial pulmonary disease, unspecified: Secondary | ICD-10-CM | POA: Diagnosis not present

## 2021-08-09 DIAGNOSIS — E876 Hypokalemia: Secondary | ICD-10-CM

## 2021-08-09 DIAGNOSIS — R0609 Other forms of dyspnea: Secondary | ICD-10-CM | POA: Diagnosis not present

## 2021-08-09 DIAGNOSIS — R0602 Shortness of breath: Secondary | ICD-10-CM | POA: Diagnosis not present

## 2021-08-09 LAB — BASIC METABOLIC PANEL
BUN: 12 mg/dL (ref 6–23)
CO2: 33 mEq/L — ABNORMAL HIGH (ref 19–32)
Calcium: 9.8 mg/dL (ref 8.4–10.5)
Chloride: 102 mEq/L (ref 96–112)
Creatinine, Ser: 0.73 mg/dL (ref 0.40–1.20)
GFR: 83.03 mL/min (ref >60.00)
Glucose, Bld: 129 mg/dL — ABNORMAL HIGH (ref 70–99)
Potassium: 3.3 mEq/L — ABNORMAL LOW (ref 3.5–5.1)
Sodium: 140 mEq/L (ref 135–145)

## 2021-08-09 LAB — D-DIMER, QUANTITATIVE: D-Dimer, Quant: 0.5 mcg/mL FEU — ABNORMAL HIGH (ref <0.50)

## 2021-08-09 MED ORDER — ALBUTEROL SULFATE HFA 108 (90 BASE) MCG/ACT IN AERS: 2.0000 | INHALATION_SPRAY | Freq: Four times a day (QID) | RESPIRATORY_TRACT | 3 refills | Status: DC | PRN

## 2021-08-09 NOTE — Patient Instructions (Addendum)
Continue Albuterol inhaler 2 puffs every 6 hours as needed for shortness of breath or wheezing. Notify if symptoms persist despite rescue inhaler/neb use. Order for approved albuterol inhaler (Proair) placed today. -Continue Ofev 100 mg Twice daily  -Continue zofran 4 mg Twice daily with Ofev   Chest x ray today. We will notify you of any abnormal results.   Labs today - d dimer, BMET  Follow up in one month with [name] or Philis Nettle. If symptoms do not improve or worsen, please contact office for sooner follow up or seek emergency care.

## 2021-08-09 NOTE — Assessment & Plan Note (Signed)
URI type symptoms with postnasal drip last week. Primarily resolved as of today. Advised to notify if symptoms return or worsen.

## 2021-08-09 NOTE — Assessment & Plan Note (Addendum)
Possible flare r/t URI. URI symptoms mostly resolved. Also concerned for possible PE given IPF hx and new tachycardia, decreased O2 levels at home, and worsened SOB. Will obtain d dimer and BMET today. CXR with chronic changes; no evidence of superimposed infection. Continued albuterol PRN. May consider short prednisone burst if d dimer negative. Tolerating Ofev well with normal LFTs in January. Encouraged return to pulmonary rehab in March.   Patient Instructions  Continue Albuterol inhaler 2 puffs every 6 hours as needed for shortness of breath or wheezing. Notify if symptoms persist despite rescue inhaler/neb use. Order for approved albuterol inhaler (Proair) placed today. -Continue Ofev 100 mg Twice daily  -Continue zofran 4 mg Twice daily with Ofev   Chest x ray today. We will notify you of any abnormal results.   Labs today - d dimer, BMET  Follow up in one month with [name] or Philis Nettle. If symptoms do not improve or worsen, please contact office for sooner follow up or seek emergency care.

## 2021-08-09 NOTE — Progress Notes (Signed)
@Patient  ID: Debra Jimenez, female    DOB: Sep 14, 1950, 71 y.o.   MRN: [id number]  Chief Complaint  Patient presents with   Follow-up    Patient says she's had multiple episodes within the past week.    Referring provider: Rosalee Kaufman, *  HPI: 71 year old female, remote former smoker (7.5 pack years) followed for ILD. She is a patient of Dr. Bari Mantis and last seen 07/07/2021. Past medical history significant for HTN and anxiety.   TEST/EVENTS:  12/2017 PFTs: Moderate intraparenchymal restriction. Ratio 88. FVC 1.7 (56), TLC 3.21 (64), DLCO 8.7 (44) 10/2019 HRCT: probably UIP, basilar predominance, early honeycombing, mild mediastinal lymphadenopathy  12/2019 PFTs: FVC 1.47 (46), TLC 3.49 (67), DLCO 12.2 (60%) 10/2020 HRCT: c/w UIP, increased in comparison to 2021 study. Unchanged prominent LNs 02/2021 PFTs: FVC 1.26 (40%), TLC 3.45 (66%), DLCO 38%  07/07/2021: OV with Dr. Elsworth Soho four month f/u. PFTs 2022 with 10% decrease in FVC, decrease in DLCO from 2021 to 2022, stable TLC - progressive phenotype. Significant nausea with Ofev - previously decreased to 100 mg Twice daily. Viral infection around thanksgiving - provided with z pack which gave her stomach problems. Enrolled in pulm rehab previously but had to disengage over past 2 months. Breathing stable.   07/07/2021: OV with Dr. Elsworth Soho. Continued Ofev at 100 mg Twice daily; did not believe she would tolerate higher doses. LFTs stable. Advised re-enrollment in pulm rehab. Repeat PFTs April 2023 and HRCT May 2023.   08/09/2021: Today - acute visit Patient presents today with husband for worsening shortness of breath. She reports she has had multiple episodes last week where she felt like she was panting. She did have some morning chest congestion and postnasal drip as well, which has slowly improved. She feels like her breathing is a little better this week but still has some chest tightness with deep breathing and very rare dry cough. Also  notes an occasional wheeze. Does not feel like her albuterol inhaler helps much. She did have some nausea one day, which she related to the postnasal drip, but never vomited. Her oxygen has gotten as low as 72% on room air at home. She denies any calf pain or redness or recent long travel. She denies orthopnea, PND, chest pain, or lower extremity swelling. She continues on Ofev twice daily and is tolerating well. Denies any yellowing to skin or eyes, clay colored stools or dark urine.   Allergies  Allergen Reactions   Atorvastatin Other (See Comments)     Muscle aches. Has tried 2 different statins and unable to tolerate    Immunization History  Administered Date(s) Administered   Fluad Quad(high Dose 65+) 06/27/2018   Influenza, High Dose Seasonal PF 04/05/2017   Influenza,inj,quad, With Preservative 02/19/2018   Moderna Sars-Covid-2 Vaccination 07/11/2019, 08/16/2019   Pneumococcal Conjugate-13 05/25/2016   Tdap 01/13/2010, 03/12/2018    Past Medical History:  Diagnosis Date   Hypothyroidism    OA (osteoarthritis)    RIGHT HIP AND LEFT HIP   Wears glasses     Tobacco History: Social History   Tobacco Use  Smoking Status Former   Packs/day: 0.50   Years: 15.00   Pack years: 7.50   Types: Cigarettes   Quit date: 1985   Years since quitting: 38.1  Smokeless Tobacco Never   Counseling given: Not Answered   Outpatient Medications Prior to Visit  Medication Sig Dispense Refill   ALPRAZolam (XANAX) 0.25 MG tablet Take 0.25 mg by mouth daily  as needed.     ergocalciferol (VITAMIN D2) 1.25 MG (50000 UT) capsule Take 50,000 Units by mouth once a week. Takes on Sunday     ezetimibe (ZETIA) 10 MG tablet Take 10 mg by mouth daily.     levothyroxine (SYNTHROID) 100 MCG tablet Take 100 mcg by mouth daily before breakfast.     losartan (COZAAR) 50 MG tablet Take 50 mg by mouth daily.     Nintedanib (OFEV) 100 MG CAPS Take 1 capsule (100 mg total) by mouth 2 (two) times daily. 60  capsule 11   ondansetron (ZOFRAN) 4 MG tablet Take 1 tablet (4 mg total) by mouth 2 (two) times daily as needed for nausea or vomiting. 30 tablet 1   zolpidem (AMBIEN) 5 MG tablet Take 5 mg by mouth at bedtime as needed.     albuterol (VENTOLIN HFA) 108 (90 Base) MCG/ACT inhaler Inhale 1 puff into the lungs as needed. 18 g 11   No facility-administered medications prior to visit.     Review of Systems:   Constitutional: No weight loss or gain, night sweats, fevers, chills, fatigue, or lassitude. HEENT: No headaches, difficulty swallowing, tooth/dental problems, or sore throat. No sneezing, itching, ear ache. +nasal congestion (improved); post nasal drip (improved) CV:  No chest pain, orthopnea, PND, swelling in lower extremities, anasarca, dizziness, palpitations, syncope Resp: +shortness of breath with exertion; occasional dry cough. No excess mucus or change in color of mucus. No hemoptysis. No wheezing.  No chest wall deformity GI:  No heartburn, indigestion, abdominal pain, nausea, vomiting, diarrhea, change in bowel habits, loss of appetite, bloody stools.  GU: No dysuria, change in color of urine, urgency or frequency.  No flank pain, no hematuria  Skin: No rash, lesions, ulcerations MSK:  No joint pain or swelling.  No decreased range of motion.  No back pain. Neuro: No dizziness or lightheadedness.  Psych: No depression or anxiety. Mood stable.     Physical Exam:  BP (!) 160/110 (BP Location: Left Arm, Patient Position: Sitting, Cuff Size: Normal)    Pulse 95    Temp 97.6 F (36.4 C) (Oral)    Ht 5\' 5"  (1.651 m)    Wt 134 lb 12.8 oz (61.1 kg)    SpO2 93%    BMI 22.43 kg/m   GEN: Pleasant, interactive, well-nourished; in no acute distress. HEENT:  Normocephalic and atraumatic. EACs patent bilaterally. TM pearly gray with present light reflex bilaterally. PERRLA. Sclera white. Nasal turbinates pink, moist and patent bilaterally. No rhinorrhea present. Oropharynx pink and moist,  without exudate or edema. No lesions, ulcerations, or postnasal drip.  NECK:  Supple w/ fair ROM. No JVD present. Normal carotid impulses w/o bruits. Thyroid symmetrical with no goiter or nodules palpated. No lymphadenopathy.   CV: RRR, no m/r/g, no peripheral edema. Pulses intact, +2 bilaterally. No cyanosis, pallor or clubbing. PULMONARY:  Unlabored, regular breathing. Minimal scattered end expiratory wheeze posteriorly. No accessory muscle use. No dullness to percussion. GI: BS present and normoactive. Soft, non-tender to palpation. No organomegaly or masses detected. No CVA tenderness. MSK: No erythema, warmth or tenderness. Cap refil <2 sec all extrem. No deformities or joint swelling noted.  Neuro: A/Ox3. No focal deficits noted.   Skin: Warm, no lesions or rashe Psych: Normal affect and behavior. Judgement and thought content appropriate.     Lab Results:  CBC    Component Value Date/Time   WBC 5.7 02/17/2020 1205   RBC 4.23 02/17/2020 1205   HGB 12.9  02/17/2020 1205   HCT 39.7 02/17/2020 1205   PLT 227 02/17/2020 1205   MCV 93.9 02/17/2020 1205   MCH 30.5 02/17/2020 1205   MCHC 32.5 02/17/2020 1205   RDW 13.8 02/17/2020 1205   LYMPHSABS 0.9 02/17/2020 1205   MONOABS 0.4 02/17/2020 1205   EOSABS 0.3 02/17/2020 1205   BASOSABS 0.0 02/17/2020 1205    BMET    Component Value Date/Time   NA 140 11/12/2019 1134   K 4.0 11/12/2019 1134   CL 102 11/12/2019 1134   CO2 27 11/12/2019 1134   GLUCOSE 123 (H) 11/12/2019 1134   BUN 18 11/12/2019 1134   CREATININE 0.70 11/12/2019 1134   CALCIUM 9.7 11/12/2019 1134   GFRNONAA >60 11/12/2019 1134   GFRAA >60 11/12/2019 1134    BNP No results found for: BNP   Imaging:  08/09/2021: CXR today reviewed by me. No evidence of acute infectious or inflammatory process. Basilar predominate ILD and chronic elevation of left hemidiaphragm.   DG Chest 2 View  Result Date: 08/09/2021 CLINICAL DATA:  Provided history: Interstitial  pulmonary disease. Shortness of breath; IPF. EXAM: CHEST - 2 VIEW COMPARISON:  Prior chest CT examinations 11/05/2020 and earlier. Chest radiograph 12/14/2017. Report from chest radiographs 11/16/2019 (images unavailable). FINDINGS: Heart size within normal limits. Redemonstrated basilar predominant interstitial lung disease, more fully characterized on the prior chest CT of 11/05/2020. No definite superimposed acute airspace consolidation is appreciated on today's exam. No evidence of pleural effusion or pneumothorax. Chronic elevation of the left hemidiaphragm. No acute bony abnormality identified. IMPRESSION: Redemonstrated basilar predominant interstitial lung disease, more fully characterized on the prior chest CT of 11/05/2020. Please refer to this prior examination for further description. No definite superimposed acute airspace consolidation is appreciated on today's examination. Chronic elevation of the left hemidiaphragm. Electronically Signed   By: Kellie Simmering D.O.   On: 08/09/2021 15:24      PFT Results Latest Ref Rng & Units 02/19/2021 01/13/2020  FVC-Pre L 1.26 1.47  FVC-Predicted Pre % 40 46  FVC-Post L 1.21 1.51  FVC-Predicted Post % 38 47  Pre FEV1/FVC % % 89 81  Post FEV1/FCV % % 92 78  FEV1-Pre L 1.13 1.19  FEV1-Predicted Pre % 47 49  FEV1-Post L 1.11 1.17  DLCO uncorrected ml/min/mmHg 7.83 12.22  DLCO UNC% % 38 60  DLCO corrected ml/min/mmHg - 12.22  DLCO COR %Predicted % - 60  DLVA Predicted % 116 112  TLC L 3.45 3.49  TLC % Predicted % 66 67  RV % Predicted % 102 90    No results found for: NITRICOXIDE   08/09/2021: Walking oximetry with SpO2 low 89% on room air. HR increased to 128 bpm on first lap.    Assessment & Plan:   ILD (interstitial lung disease) ([place]) Possible flare r/t URI. URI symptoms mostly resolved. Also concerned for possible PE given IPF hx and new tachycardia, decreased O2 levels at home, and worsened SOB. Will obtain d dimer and BMET today. CXR  with chronic changes; no evidence of superimposed infection. Continued albuterol PRN. May consider short prednisone burst if d dimer negative. Tolerating Ofev well with normal LFTs in January. Encouraged return to pulmonary rehab in March.   Patient Instructions  Continue Albuterol inhaler 2 puffs every 6 hours as needed for shortness of breath or wheezing. Notify if symptoms persist despite rescue inhaler/neb use. Order for approved albuterol inhaler (Proair) placed today. -Continue Ofev 100 mg Twice daily  -Continue zofran 4 mg  Twice daily with Ofev   Chest x ray today. We will notify you of any abnormal results.   Labs today - d dimer, BMET  Follow up in one month with Dr. Elsworth Soho or Alanson Aly. If symptoms do not improve or worsen, please contact office for sooner follow up or seek emergency care.    URI (upper respiratory infection) URI type symptoms with postnasal drip last week. Primarily resolved as of today. Advised to notify if symptoms return or worsen.      Clayton Bibles, NP 08/09/2021  Pt aware and understands NP's role.

## 2021-08-10 ENCOUNTER — Telehealth: Payer: Self-pay | Admitting: Nurse Practitioner

## 2021-08-10 ENCOUNTER — Ambulatory Visit (HOSPITAL_COMMUNITY)
Admission: RE | Admit: 2021-08-10 | Discharge: 2021-08-10 | Disposition: A | Discharge status: Home / Self Care | Payer: Medicare Other | Source: Ambulatory Visit | Attending: Nurse Practitioner | Admitting: Nurse Practitioner

## 2021-08-10 DIAGNOSIS — R079 Chest pain, unspecified: Secondary | ICD-10-CM | POA: Insufficient documentation

## 2021-08-10 DIAGNOSIS — J189 Pneumonia, unspecified organism: Secondary | ICD-10-CM

## 2021-08-10 DIAGNOSIS — R0602 Shortness of breath: Secondary | ICD-10-CM | POA: Diagnosis not present

## 2021-08-10 MED ORDER — POTASSIUM CHLORIDE CRYS ER 20 MEQ PO TBCR: 20.0000 meq | EXTENDED_RELEASE_TABLET | Freq: Every day | ORAL | 0 refills | Status: DC

## 2021-08-10 MED ORDER — IOHEXOL 350 MG/ML SOLN
100.0000 mL | Freq: Once | INTRAVENOUS | Status: AC | PRN
Start: 2021-08-10 — End: 2021-08-10
  Administered 2021-08-10: 80 mL via INTRAVENOUS

## 2021-08-10 MED ORDER — PREDNISONE 20 MG PO TABS: 20.0000 mg | ORAL_TABLET | Freq: Every day | ORAL | 0 refills | Status: AC

## 2021-08-10 MED ORDER — AMOXICILLIN-POT CLAVULANATE 875-125 MG PO TABS: 1.0000 | ORAL_TABLET | Freq: Two times a day (BID) | ORAL | 0 refills | Status: AC

## 2021-08-10 NOTE — Addendum Note (Signed)
Addended by: Noemi Chapel on: 08/10/2021 10:04 AM   Modules accepted: Orders

## 2021-08-10 NOTE — Telephone Encounter (Signed)
Marland Kitchen V, NP  08/10/2021 12:58 PM EST     Attempted to contact pt. CTA was negative for PE, which is good news. She does have some areas in her upper lobes that are concerning for possible pneumonia. I am going to send in an abx course of augmentin 875 1 tab Twice daily for 7 days. Take with food and take daily probiotic. Advise her to still take the prednisone. Ensure she has follow up in 2 weeks. Notify if symptoms worsen. Thanks!    Patient is aware results and voiced her understanding. Appt scheduled 08/25/2021 at 2:00. Nothing further needed.

## 2021-08-10 NOTE — Progress Notes (Signed)
Attempted to contact pt. CTA was negative for PE, which is good news. She does have some areas in her upper lobes that are concerning for possible pneumonia. I am going to send in an abx course of augmentin 875 1 tab Twice daily for 7 days. Take with food and take daily probiotic. Advise her to still take the prednisone. Ensure she has follow up in 2 weeks. Notify if symptoms worsen. Thanks!

## 2021-08-10 NOTE — Progress Notes (Signed)
Spoke with pt and voices understanding. Pt did not have any further questions. She reports that she already had a call.

## 2021-08-25 ENCOUNTER — Ambulatory Visit: Payer: Medicare Other | Admitting: Nurse Practitioner

## 2021-09-03 ENCOUNTER — Other Ambulatory Visit: Payer: Self-pay

## 2021-09-03 ENCOUNTER — Encounter: Payer: Self-pay | Admitting: Nurse Practitioner

## 2021-09-03 ENCOUNTER — Ambulatory Visit: Payer: Medicare Other | Admitting: Nurse Practitioner

## 2021-09-03 VITALS — BP 120/70 | HR 72 | Temp 98.0°F | Ht 65.0 in | Wt 133.0 lb

## 2021-09-03 DIAGNOSIS — J849 Interstitial pulmonary disease, unspecified: Secondary | ICD-10-CM | POA: Diagnosis not present

## 2021-09-03 DIAGNOSIS — K5900 Constipation, unspecified: Secondary | ICD-10-CM | POA: Insufficient documentation

## 2021-09-03 DIAGNOSIS — R0609 Other forms of dyspnea: Secondary | ICD-10-CM | POA: Diagnosis not present

## 2021-09-03 DIAGNOSIS — I10 Essential (primary) hypertension: Secondary | ICD-10-CM

## 2021-09-03 DIAGNOSIS — K5909 Other constipation: Secondary | ICD-10-CM

## 2021-09-03 NOTE — Assessment & Plan Note (Signed)
Well-controlled at visit. Follow up with PCP as scheduled.  ?

## 2021-09-03 NOTE — Progress Notes (Signed)
? ?@Patient  ID: , female    DOB: July 20, 1950, 71 y.o.   MRN: [id number] ? ?Chief Complaint  ?Patient presents with  ? Follow-up  ?  She feels that she is about the same and has short of breath with exertion.  ? ? ?Referring provider: ?[id number], * ? ?HPI: ?71 year old female, former smoker (15 pack years) followed for ILD.  She is a patient Dr. 66 and last seen in office 08/09/2021 by [facility] NP.  Past medical history significant for hypertension and anxiety. ? ?TEST/EVENTS:  ?12/2017 PFTs: Moderate intraparenchymal restriction.  Ratio 88.  FVC 1.7 (56), TLC 3.21 (64), DLCO 8.7 (44) ?10/2019 HRCT: Probable UIP, basilar predominance, early honeycombing, mild mediastinal lymphadenopathy ?12/2019 PFTs: FVC 1.47 (46), TLC 3.49 (67), DLCO 60% ?10/2020 HRCT: Consistent with UIP, increased in comparison to 2021 study.  Unchanged prominent LNs. ?02/2021 PFTs: FVC 1.26 (40%), TLC 3.45 (66%), DLCO 38% ?08/10/2021 CTA chest: No evidence of PE.  Atherosclerosis.  Redemonstrated multiple mildly enlarged mediastinal and bilateral hilar lymph nodes.  Slight increase in AP window node to 1.4 cm from 1.2 cm.  Moderate changes of pulmonary fibrosis throughout both lungs, overall similar in appearance to previous.  Patchy areas of increased groundglass attenuation most pronounced within the upper lobes bilaterally, concerning for superimposed infectious or inflammatory process. ? ?07/07/2021: OV with Dr. 07/09/2021 four month f/u. PFTs 2022 with 10% decrease in FVC, decrease in DLCO from 2021 to 2022, stable TLC - progressive phenotype. Significant nausea with Ofev - previously decreased to 100 mg Twice daily. Viral infection around thanksgiving - provided with z pack which gave her stomach problems. Enrolled in pulm rehab previously but had to disengage over past 2 months. Breathing stable.  ?  ?07/07/2021: OV with Dr. 07/09/2021. Continued Ofev at 100 mg Twice daily; did not believe she would tolerate higher doses. LFTs stable.  Advised re-enrollment in pulm rehab. Repeat PFTs April 2023 and HRCT May 2023.  ?  ?08/09/2021: OV with Kiaria Quinnell NP.  Worsening shortness of breath as well as some episodes of feeling weak and panting.  Felt she had more chest congestion and postnasal drip, which was slowly improving.  Felt like her breathing had slightly improved but then began having some chest tightness with deep breathing and a very rare dry cough.  Felt like albuterol did not help as much.  Reported that her oxygen has gotten low of 72% on room air at home.  Walking oximetry in office with SPO2 low 89% on room air, recovered to 90s without oxygen therapy.  CXR showed chronic changes.  COVID symptoms a D-dimer was obtained which was borderline positive.  CTA was ordered which is negative for PE; concern for possible pneumonia.  No mention of significant progression of fibrosis.  Treated with Augmentin course and prednisone.  Potassium noted to be low on [place].  Was placed on 20 mEq daily and advised to follow-up with PCP.  Close follow-up.  Plans for repeat PFTs in April 2023 and HRCT in May 2023 ? ?09/03/2021: Today-follow-up ?Patient presents today with husband for follow-up.  She reports feeling better and has not had any more episodes of particular lightheadedness.  She still continues to have some shortness of breath with exertion that she feels has been progressive over time.  Has not noticed any decreased oxygen levels at home since our last visit. Cough has mostly resolved. Denies orthopnea, PND, lower extremity swelling or hemoptysis. Rare use of albuterol. Continues on Ofev without  difficulties. Does report some recent constipation which she began taking miralax for yesterday.  ? ?Allergies  ?Allergen Reactions  ? Atorvastatin Other (See Comments)  ?   Muscle aches. Has tried 2 different statins and unable to tolerate  ? ? ?Immunization History  ?Administered Date(s) Administered  ? Fluad Quad(high Dose 65+) 06/27/2018  ? Influenza, High  Dose Seasonal PF 04/05/2017  ? Influenza,inj,quad, With Preservative 02/19/2018  ? Moderna Sars-Covid-2 Vaccination 07/11/2019, 08/16/2019  ? Pneumococcal Conjugate-13 05/25/2016  ? Tdap 01/13/2010, 03/12/2018  ? ? ?Past Medical History:  ?Diagnosis Date  ? Hypothyroidism   ? OA (osteoarthritis)   ? RIGHT HIP AND LEFT HIP  ? Wears glasses   ? ? ?Tobacco History: ?Social History  ? ?Tobacco Use  ?Smoking Status Former  ? Packs/day: 0.50  ? Years: 15.00  ? Pack years: 7.50  ? Types: Cigarettes  ? Quit date: 41  ? Years since quitting: 38.2  ?Smokeless Tobacco Never  ? ?Counseling given: Not Answered ? ? ?Outpatient Medications Prior to Visit  ?Medication Sig Dispense Refill  ? albuterol (PROAIR HFA) 108 (90 Base) MCG/ACT inhaler Inhale 2 puffs into the lungs every 6 (six) hours as needed for wheezing or shortness of breath. 6.7 g 3  ? ALPRAZolam (XANAX) 0.25 MG tablet Take 0.25 mg by mouth daily as needed.    ? ezetimibe (ZETIA) 10 MG tablet Take 10 mg by mouth daily.    ? levothyroxine (SYNTHROID) 100 MCG tablet Take 100 mcg by mouth daily before breakfast.    ? losartan (COZAAR) 50 MG tablet Take 50 mg by mouth daily.    ? Nintedanib (OFEV) 100 MG CAPS Take 1 capsule (100 mg total) by mouth 2 (two) times daily. 60 capsule 11  ? ondansetron (ZOFRAN) 4 MG tablet Take 1 tablet (4 mg total) by mouth 2 (two) times daily as needed for nausea or vomiting. 30 tablet 1  ? potassium chloride SA (KLOR-CON M) 20 MEQ tablet Take 1 tablet (20 mEq total) by mouth daily. 14 tablet 0  ? zolpidem (AMBIEN) 5 MG tablet Take 5 mg by mouth at bedtime as needed.    ? ergocalciferol (VITAMIN D2) 1.25 MG (50000 UT) capsule Take 50,000 Units by mouth once a week. Takes on Sunday    ? ?No facility-administered medications prior to visit.  ? ? ? ?Review of Systems:  ? ?Constitutional: No weight loss or gain, night sweats, fevers, chills, fatigue, or lassitude. ?HEENT: No headaches, difficulty swallowing, tooth/dental problems, or sore  throat. No sneezing, itching, ear ache, nasal congestion, or post nasal drip ?CV:  No chest pain, orthopnea, PND, swelling in lower extremities, anasarca, dizziness, palpitations, syncope ?Resp: +shortness of breath with exertion (improved but overall progressive decline). No excess mucus or change in color of mucus. No productive or non-productive. No hemoptysis. No wheezing.  No chest wall deformity ?GI:  +constipation. No heartburn, indigestion, abdominal pain, nausea, vomiting, diarrhea, loss of appetite, bloody stools.  ?GU: No dysuria, change in color of urine, urgency or frequency.  No flank pain, no hematuria  ?Skin: No rash, lesions, ulcerations ?MSK:  No joint pain or swelling.  No decreased range of motion.  No back pain. ?Neuro: No dizziness or lightheadedness.  ?Psych: No depression or anxiety. Mood stable.  ? ? ? ?Physical Exam: ? ?BP 120/70 (BP Location: Left Arm, Patient Position: Sitting, Cuff Size: Normal)   Pulse 72   Temp 98 ?F (36.7 ?C) (Oral)   Ht 5\' 5"  (1.651 m)  Wt 133 lb (60.3 kg)   SpO2 96%   BMI 22.13 kg/m?  ? ?GEN: Pleasant, interactive, well-appearing; in no acute distress. ?HEENT:  Normocephalic and atraumatic. PERRLA. Sclera white. Nasal turbinates pink, moist and patent bilaterally. No rhinorrhea present. Oropharynx pink and moist, without exudate or edema. No lesions, ulcerations, or postnasal drip.  ?NECK:  Supple w/ fair ROM. No JVD present. Normal carotid impulses w/o bruits. Thyroid symmetrical with no goiter or nodules palpated. No lymphadenopathy.   ?CV: RRR, no m/r/g, no peripheral edema. Pulses intact, +2 bilaterally. No cyanosis, pallor or clubbing. ?PULMONARY:  Unlabored, regular breathing. Clear bilaterally A&P w/o wheezes/rales/rhonchi. No accessory muscle use. No dullness to percussion. ?GI: BS present and normoactive. Soft, non-tender to palpation. No organomegaly or masses detected. No CVA tenderness. ?MSK: No erythema, warmth or tenderness. Cap refil <2 sec all  extrem. No deformities or joint swelling noted.  ?Neuro: A/Ox3. No focal deficits noted.   ?Skin: Warm, no lesions or rashe ?Psych: Normal affect and behavior. Judgement and thought content appropriate.  ? ? ? ?[place]

## 2021-09-03 NOTE — Patient Instructions (Addendum)
Continue Albuterol inhaler 2 puffs every 6 hours as needed for shortness of breath or wheezing. Notify if symptoms persist despite rescue inhaler/neb use. Order for approved albuterol inhaler (Proair) placed today. ?-Continue Ofev 100 mg Twice daily. Check LFTs at visit in May  ?-Continue zofran 4 mg Twice daily with Ofev  ? ?Over the counter colace and miralax  ?  ?Pulmonary function testing scheduled today  ? ?High resolution CT chest can first week in May.  ?  ?Follow up with [name] in May after PFTs and HRCT chest. If symptoms do not improve or worsen, please contact office for sooner follow up or seek emergency care. ?

## 2021-09-03 NOTE — Assessment & Plan Note (Signed)
Improved since previous visit. Progressive DOE. Plans to repeat PFTs in April. Will add 6 min walk. HRCT in May-requested to be scheduled beginning of May for 4-6 week follow up of pna tx. Continued albuterol PRN. Tolerating Ofev well with normal LFTs in January. Encouraged return to pulmonary rehab in March.  ? ?Patient Instructions  ?Continue Albuterol inhaler 2 puffs every 6 hours as needed for shortness of breath or wheezing. Notify if symptoms persist despite rescue inhaler/neb use. Order for approved albuterol inhaler (Proair) placed today. ?-Continue Ofev 100 mg Twice daily. Check LFTs at visit in May  ?-Continue zofran 4 mg Twice daily with Ofev  ? ?Over the counter colace and miralax  ?  ?Pulmonary function testing scheduled today  ? ?High resolution CT chest can first week in May.  ?  ?Follow up with [name] in May after PFTs and HRCT chest. If symptoms do not improve or worsen, please contact office for sooner follow up or seek emergency care. ? ? ?

## 2021-09-03 NOTE — Assessment & Plan Note (Signed)
Advised miralax and colace OTC. Follow up with PCP if constipation persists.  ?

## 2021-09-23 ENCOUNTER — Encounter (HOSPITAL_COMMUNITY): Payer: Medicare Other

## 2021-10-07 ENCOUNTER — Ambulatory Visit (HOSPITAL_COMMUNITY)
Admission: RE | Admit: 2021-10-07 | Discharge: 2021-10-07 | Disposition: A | Discharge status: Home / Self Care | Payer: Medicare Other | Source: Ambulatory Visit | Attending: Nurse Practitioner | Admitting: Nurse Practitioner

## 2021-10-07 DIAGNOSIS — R0609 Other forms of dyspnea: Secondary | ICD-10-CM | POA: Diagnosis present

## 2021-10-07 DIAGNOSIS — J849 Interstitial pulmonary disease, unspecified: Secondary | ICD-10-CM

## 2021-10-07 LAB — PULMONARY FUNCTION TEST
DL/VA % pred: 68 %
DL/VA: 2.82 ml/min/mmHg/L
DLCO unc % pred: 21 %
DLCO unc: 4.33 ml/min/mmHg
FEF 25-75 Post: 2.56 L/sec
FEF 25-75 Pre: 1.3 L/sec
FEF2575-%Change-Post: 97 %
FEF2575-%Pred-Post: 134 %
FEF2575-%Pred-Pre: 67 %
FEV1-%Change-Post: 12 %
FEV1-%Pred-Post: 44 %
FEV1-%Pred-Pre: 39 %
FEV1-Post: 1.05 L
FEV1-Pre: 0.93 L
FEV1FVC-%Change-Post: 7 %
FEV1FVC-%Pred-Pre: 116 %
FEV6-%Change-Post: 4 %
FEV6-%Pred-Post: 37 %
FEV6-%Pred-Pre: 35 %
FEV6-Post: 1.1 L
FEV6-Pre: 1.06 L
FEV6FVC-%Pred-Post: 104 %
FEV6FVC-%Pred-Pre: 104 %
FVC-%Change-Post: 5 %
FVC-%Pred-Post: 35 %
FVC-%Pred-Pre: 34 %
FVC-Post: 1.11 L
FVC-Pre: 1.06 L
Post FEV1/FVC ratio: 94 %
Post FEV6/FVC ratio: 100 %
Pre FEV1/FVC ratio: 88 %
Pre FEV6/FVC Ratio: 100 %
RV % pred: 50 %
RV: 1.14 L
TLC % pred: 43 %
TLC: 2.24 L

## 2021-10-07 MED ORDER — ALBUTEROL SULFATE (2.5 MG/3ML) 0.083% IN NEBU
2.5000 mg | INHALATION_SOLUTION | Freq: Once | RESPIRATORY_TRACT | Status: AC
  Administered 2021-10-07: 2.5 mg via RESPIRATORY_TRACT

## 2021-11-08 ENCOUNTER — Ambulatory Visit (HOSPITAL_COMMUNITY)
Admission: RE | Admit: 2021-11-08 | Discharge: 2021-11-08 | Disposition: A | Discharge status: Home / Self Care | Payer: Medicare Other | Source: Ambulatory Visit | Attending: Physician Assistant | Admitting: Physician Assistant

## 2021-11-08 DIAGNOSIS — J479 Bronchiectasis, uncomplicated: Secondary | ICD-10-CM | POA: Diagnosis not present

## 2021-11-08 DIAGNOSIS — I700 Atherosclerosis of aorta: Secondary | ICD-10-CM | POA: Diagnosis not present

## 2021-11-08 DIAGNOSIS — J849 Interstitial pulmonary disease, unspecified: Secondary | ICD-10-CM | POA: Insufficient documentation

## 2021-11-08 DIAGNOSIS — I2510 Atherosclerotic heart disease of native coronary artery without angina pectoris: Secondary | ICD-10-CM | POA: Diagnosis not present

## 2021-11-08 DIAGNOSIS — J84112 Idiopathic pulmonary fibrosis: Secondary | ICD-10-CM | POA: Diagnosis not present

## 2021-11-10 ENCOUNTER — Telehealth: Payer: Self-pay

## 2021-11-10 ENCOUNTER — Ambulatory Visit: Payer: Medicare Other | Admitting: Pulmonary Disease

## 2021-11-10 ENCOUNTER — Other Ambulatory Visit: Payer: Self-pay

## 2021-11-10 ENCOUNTER — Encounter: Payer: Self-pay | Admitting: Pulmonary Disease

## 2021-11-10 VITALS — BP 140/82 | HR 68 | Temp 97.7°F | Ht 65.5 in | Wt 130.6 lb

## 2021-11-10 DIAGNOSIS — J84112 Idiopathic pulmonary fibrosis: Secondary | ICD-10-CM

## 2021-11-10 DIAGNOSIS — R0609 Other forms of dyspnea: Secondary | ICD-10-CM | POA: Diagnosis not present

## 2021-11-10 DIAGNOSIS — J9611 Chronic respiratory failure with hypoxia: Secondary | ICD-10-CM

## 2021-11-10 MED ORDER — ONDANSETRON HCL 4 MG PO TABS: 4.0000 mg | ORAL_TABLET | Freq: Two times a day (BID) | ORAL | 1 refills | Status: DC | PRN

## 2021-11-10 NOTE — Assessment & Plan Note (Signed)
We will provide her with portable oxygen.  Her goal at this time is to maintain activity levels as much as possible. We also discussed RSV vaccine

## 2021-11-10 NOTE — Telephone Encounter (Signed)
[name] wanted patient to trial esbriet after talking with her during OV today. Routing to Rx team to see if this is something they can help [place] get started for the patient?

## 2021-11-10 NOTE — Patient Instructions (Signed)
  X Ambulatory sat  X LFTs , BMET today  Options include  -stay on ofev - trial of esbriet - research study

## 2021-11-10 NOTE — Assessment & Plan Note (Addendum)
Unfortunately she has had disease progression on 100 mg of twice daily as demonstrated by HRCT and drop in lung function.  Unfortunately she has not tolerated full dose of Ofev. Our options include -Staying at same dose of Ofev -Change to Esbriet -Research trial  We will explore these options.  I discussed disease progression, disease course and possibility of IPF flare. We will provide refill on Zofran. Blood work will be checked today for LFTs and hypokalemia

## 2021-11-10 NOTE — Progress Notes (Unsigned)
   Subjective:    Patient ID: Debra Jimenez, female    DOB: Feb 23, 1951, 71 y.o.   MRN: [id number]  HPI  71 yo remote smoker for follow-up of ILD, symptom onset since 2019 -New onset hypertension on lisinopril   She had 10% drop in FVC from 20 19-20 21 >> progressive phenotype.  Developed significant nausea with Ofev and this was decreased  to 100 mg twice daily  She was enrolled in pulmonary rehab but had to drop out   Chief Complaint  Patient presents with   Follow-up    CT done 11/09/21 PFT done 10/07/21.  Breathing is about the same     Seen 2/023 acute oV , CTA neg PE , showed upper lobe hazy groundglass Treated with augmentin , prednisone, hypokalemia was supplemented. She felt improved after taking prednisone, dyspnea improved. She continues to complain of shortness of breath on activity, cough is resolved  We reviewed PFTs and HRCT today She is tolerating Ofev 100 mg twice daily  Significant tests/ events reviewed   HRCT 10/2021 >> Slightly worsened moderate pulmonary fibrosis , c/w UIP HRCT 10/2020 >> c/w UIP, increased compared to 2021, unchanged prominent LNs     HRCT 10/2019 "probable" UIP , basilar predominance, early honeycombing , mild mediastinal lymphadenopathy    PFTs 09/2021  FVC 1.11/35%, TLC 44%, DLCO 4.33/21%  PFTs 02/2021 -FVC 1.26/40%, TLC 3.45/66%, DLCO 7.83/38%   PFTs 12/2019 -FVC 1.47/46%, TLC 3.49/67%, DLCO 12.2/60%   PFTs 12/2017  -[facility] -moderate intraparenchymal restriction, ratio 88, FVC 56%/1.70, DLCO 8.7/44%, TLC 3.21/64%   Review of Systems neg for any significant sore throat, dysphagia, itching, sneezing, nasal congestion or excess/ purulent secretions, fever, chills, sweats, unintended wt loss, pleuritic or exertional cp, hempoptysis, orthopnea pnd or change in chronic leg swelling. Also denies presyncope, palpitations, heartburn, abdominal pain, nausea, vomiting, diarrhea or change in bowel or urinary habits, dysuria,hematuria,  rash, arthralgias, visual complaints, headache, numbness weakness or ataxia.     Objective:   Physical Exam  Gen. Pleasant, well-nourished, in no distress ENT - no thrush, no pallor/icterus,no post nasal drip Neck: No JVD, no thyromegaly, no carotid bruits Lungs: no use of accessory muscles, no dullness to percussion, bibasal rales no rhonchi  Cardiovascular: Rhythm regular, heart sounds  normal, no murmurs or gallops, no peripheral edema Musculoskeletal: No deformities, no cyanosis or clubbing        Assessment & Plan:

## 2021-11-11 ENCOUNTER — Telehealth: Payer: Self-pay | Admitting: Pharmacist

## 2021-11-11 LAB — HEPATIC FUNCTION PANEL
ALT: 11 IU/L (ref 0–32)
AST: 17 IU/L (ref 0–40)
Albumin: 4.4 g/dL (ref 3.7–4.7)
Alkaline Phosphatase: 73 IU/L (ref 44–121)
Bilirubin Total: 0.6 mg/dL (ref 0.0–1.2)
Bilirubin, Direct: 0.15 mg/dL (ref 0.00–0.40)
Total Protein: 7.3 g/dL (ref 6.0–8.5)

## 2021-11-11 LAB — BASIC METABOLIC PANEL
BUN/Creatinine Ratio: 24 (ref 12–28)
BUN: 16 mg/dL (ref 8–27)
CO2: 24 mmol/L (ref 20–29)
Calcium: 10.2 mg/dL (ref 8.7–10.3)
Chloride: 105 mmol/L (ref 96–106)
Creatinine, Ser: 0.68 mg/dL (ref 0.57–1.00)
Glucose: 111 mg/dL — ABNORMAL HIGH (ref 70–99)
Potassium: 4.3 mmol/L (ref 3.5–5.2)
Sodium: 143 mmol/L (ref 134–144)
eGFR: 93 mL/min/{1.73_m2} (ref >59)

## 2021-11-11 NOTE — Telephone Encounter (Signed)
Submitted a Prior Authorization request to [facility] for PIRFENIDONE via CoverMyMeds. Will update once we receive a response.  Key: [id number]  Left VM to determine if she took home [facility] application as it was not in pharmacy mailbox today. Requested return call from patient.  Patient previously receiving Ofev through [facility] so will likely need pt assistance through [facility]

## 2021-11-11 NOTE — Telephone Encounter (Signed)
Will start Esbriet BIV in separate telephone encounter.

## 2021-11-12 ENCOUNTER — Other Ambulatory Visit (HOSPITAL_COMMUNITY): Payer: Self-pay

## 2021-11-12 NOTE — Telephone Encounter (Signed)
Submitted Patient Assistance Application to [employer] for ESBRIET along with provider portion, PA and income documents. Will update patient when we receive a response.  Fax# [phone] Phone# [phone] 

## 2021-11-12 NOTE — Telephone Encounter (Signed)
Pt portion of PAP application has been sent to pt via DocuSign.

## 2021-11-12 NOTE — Telephone Encounter (Addendum)
Received notification from [facility] regarding a prior authorization for PIRFENIDONE. Authorization has been APPROVED from 11/11/21 to 06/19/22.   Per test claim, copay for 28 days supply is $286.51 (ran 180 tabs bc insurance covers max 6 tabs per day)  Authorization # [id number]  Spoke with patient regarding [employer] application. She states she did not receive at [facility]. She would like to try [employer] option to expedite turnaround time. Confirmed email address on file is correct for [employer]. Notified [facility] via Teams to send to patient.  Provider portion placed in Dr. Reginia Naas mailbox to be signed  Chesley Mires, PharmD, MPH, BCPS, CPP Clinical Pharmacist (Rheumatology and Pulmonology)

## 2021-11-16 DIAGNOSIS — Z23 Encounter for immunization: Secondary | ICD-10-CM | POA: Diagnosis not present

## 2021-11-17 NOTE — Telephone Encounter (Signed)
Received denial letter from plan:

## 2021-11-17 NOTE — Telephone Encounter (Signed)
Prior authorization approval letter has already been received for pirfenidone and has been submitted to [facility]. Will disregard this denial letter  Chesley Mires, PharmD, MPH, BCPS, CPP Clinical Pharmacist (Rheumatology and Pulmonology)

## 2021-11-19 DIAGNOSIS — E039 Hypothyroidism, unspecified: Secondary | ICD-10-CM | POA: Diagnosis not present

## 2021-11-19 DIAGNOSIS — T466X5A Adverse effect of antihyperlipidemic and antiarteriosclerotic drugs, initial encounter: Secondary | ICD-10-CM | POA: Diagnosis not present

## 2021-11-19 DIAGNOSIS — M791 Myalgia, unspecified site: Secondary | ICD-10-CM | POA: Diagnosis not present

## 2021-11-19 DIAGNOSIS — G4700 Insomnia, unspecified: Secondary | ICD-10-CM | POA: Diagnosis not present

## 2021-11-19 DIAGNOSIS — J849 Interstitial pulmonary disease, unspecified: Secondary | ICD-10-CM | POA: Diagnosis not present

## 2021-11-19 DIAGNOSIS — Z6821 Body mass index (BMI) 21.0-21.9, adult: Secondary | ICD-10-CM | POA: Diagnosis not present

## 2021-11-19 DIAGNOSIS — I10 Essential (primary) hypertension: Secondary | ICD-10-CM | POA: Diagnosis not present

## 2021-11-19 NOTE — Telephone Encounter (Addendum)
Called [facility] for update on patient's Esbriet application. Per rep, received verbal approval for ESBRIET patient assistance from 11/19/21 until patient is no longer eligible for program or discontinues medication. Requested fax of approval letter. Patient will be reached out to today by Mendel Ryder to complete welcome call  [facility] Phone: [phone], option 5 [employer] Phone: [phone]  Chesley Mires, PharmD, MPH, BCPS, CPP Clinical Pharmacist (Rheumatology and Pulmonology)

## 2021-11-22 ENCOUNTER — Telehealth: Payer: Self-pay

## 2021-11-22 DIAGNOSIS — J84112 Idiopathic pulmonary fibrosis: Secondary | ICD-10-CM

## 2021-11-22 DIAGNOSIS — Z5181 Encounter for therapeutic drug level monitoring: Secondary | ICD-10-CM

## 2021-11-22 NOTE — Telephone Encounter (Incomplete)
Subjective:  Patient presents today to [facility] to see pharmacy team for Esbriet new start.   Patient was last seen by [name] on 11/10/21.  Pertinent past medical history includes idiopathic pulmonary fibrosis, interstitial lung disease, constipation and hypertension. Prior therapy includes Ofev.  History of elevated LFTs: No History of diarrhea, nausea, vomiting: {yes/no:20286}  Objective: Allergies  Allergen Reactions   Atorvastatin Other (See Comments)     Muscle aches. Has tried 2 different statins and unable to tolerate    Outpatient Encounter Medications as of 11/22/2021  Medication Sig Note   albuterol (PROAIR HFA) 108 (90 Base) MCG/ACT inhaler Inhale 2 puffs into the lungs every 6 (six) hours as needed for wheezing or shortness of breath.    ALPRAZolam (XANAX) 0.25 MG tablet Take 0.25 mg by mouth daily as needed. 12/10/2019: Uses 1 to 2 times a week.   levothyroxine (SYNTHROID) 100 MCG tablet Take 100 mcg by mouth daily before breakfast.    losartan (COZAAR) 50 MG tablet Take 50 mg by mouth daily.    Nintedanib (OFEV) 100 MG CAPS Take 1 capsule (100 mg total) by mouth 2 (two) times daily.    ondansetron (ZOFRAN) 4 MG tablet Take 1 tablet (4 mg total) by mouth 2 (two) times daily as needed for nausea or vomiting.    zolpidem (AMBIEN) 5 MG tablet Take 5 mg by mouth at bedtime as needed.    No facility-administered encounter medications on file as of 11/22/2021.     Immunization History  Administered Date(s) Administered   Fluad Quad(high Dose 65+) 06/27/2018   Influenza, High Dose Seasonal PF 04/05/2017   Influenza,inj,quad, With Preservative 02/19/2018   Moderna Sars-Covid-2 Vaccination 07/11/2019, 08/16/2019   Pneumococcal Conjugate-13 05/25/2016   Tdap 01/13/2010, 03/12/2018      PFT's TLC  Date Value Ref Range Status  10/07/2021 2.24 L Final      CMP     Component Value Date/Time   NA 143 11/10/2021 1339   K 4.3 11/10/2021 1339   CL 105 11/10/2021  1339   CO2 24 11/10/2021 1339   GLUCOSE 111 (H) 11/10/2021 1339   GLUCOSE 129 (H) 08/09/2021 1515   BUN 16 11/10/2021 1339   CREATININE 0.68 11/10/2021 1339   CALCIUM 10.2 11/10/2021 1339   PROT 7.3 11/10/2021 1339   ALBUMIN 4.4 11/10/2021 1339   AST 17 11/10/2021 1339   ALT 11 11/10/2021 1339   ALKPHOS 73 11/10/2021 1339   BILITOT 0.6 11/10/2021 1339   GFRNONAA >60 11/12/2019 1134   GFRAA >60 11/12/2019 1134      CBC    Component Value Date/Time   WBC 5.7 02/17/2020 1205   RBC 4.23 02/17/2020 1205   HGB 12.9 02/17/2020 1205   HCT 39.7 02/17/2020 1205   PLT 227 02/17/2020 1205   MCV 93.9 02/17/2020 1205   MCH 30.5 02/17/2020 1205   MCHC 32.5 02/17/2020 1205   RDW 13.8 02/17/2020 1205   LYMPHSABS 0.9 02/17/2020 1205   MONOABS 0.4 02/17/2020 1205   EOSABS 0.3 02/17/2020 1205   BASOSABS 0.0 02/17/2020 1205      LFT's    Latest Ref Rng & Units 11/10/2021    1:39 PM 07/07/2021   12:09 PM 09/28/2020   12:31 PM  Hepatic Function  Total Protein 6.0 - 8.5 g/dL 7.3   7.2   7.5    Albumin 3.7 - 4.7 g/dL 4.4   4.7   4.2    AST 0 - 40 IU/L 17  18   21    ALT 0 - 32 IU/L $Remov'11   12   18    'mhEVLv$ Alk Phosphatase 44 - 121 IU/L 73   82   62    Total Bilirubin 0.0 - 1.2 mg/dL 0.6   0.6   0.8    Bilirubin, Direct 0.00 - 0.40 mg/dL 0.15   0.13   0.1        HRCT (11/09/21): Slightly worsened moderate pulmonary fibrosis in a pattern with apical to basal gradient featuring irregular peripheral interstitial opacity, septal thickening, traction bronchiectasis, subpleural bronchiolectasis, and honeycombing at the lung bases. Findings are consistent with UIP per consensus guidelines  Assessment and Plan  Esbriet Medication Management Thoroughly counseled patient on the efficacy, mechanism of action, dosing, administration, adverse effects, and monitoring parameters of Esbriet.  Patient verbalized understanding.  Goals of Therapy: Will not stop or reverse the progression of ILD. It will slow the  progression of ILD.   Dosing: Starting dose will be Esbriet 267 mg 1 tablet three times daily for 7 days, then 2 tablets three times daily for 7 days, then 3 tablets three times daily.  Maintenance dose will be 801 mg 1 tablet three times daily if tolerated.  Stressed the importance of taking with meals and space at least 5-6 hours apart to minimize stomach upset.   Adverse Effects: Nausea, vomiting, diarrhea, weight loss Abdominal pain GERD Sun sensitivity/rash - patient advised to wear sunscreen when exposed to sunlight Dizziness Fatigue  Monitoring: Monitor for diarrhea, nausea and vomiting, GI perforation, hepatotoxicity  Monitor LFTs - baseline, monthly for first 6 months, then every 3 months routinely CBC w differential at baseline and every 3 months routinely Labs from 5/24 reviewed and WNL  Access: Approval of Esbriet through: patient assistance Rx sent to: [place] ([place] for Esbriet: [phone]  Medication Reconciliation A drug regimen assessment was performed, including review of allergies, interactions, disease-state management, dosing and immunization history. Medications were reviewed with the patient, including name, instructions, indication, goals of therapy, potential side effects, importance of adherence, and safe use.  Immunizations Patient is indicated for the influenzae, pneumonia, and shingles vaccinations. Patient has received *** COVID19 vaccines.  This appointment required *** minutes of patient care (this includes precharting, chart review, review of results, face-to-face care, etc.).  Thank you for involving pharmacy to assist in providing this patient's care.

## 2021-11-22 NOTE — Telephone Encounter (Signed)
Attempted to call pt to review Esbriet. Pt requested call back tomorrow afternoon.  Sherald Hess, PharmD Candidate 11/22/2021 3:02 PM

## 2021-11-23 MED ORDER — PIRFENIDONE 267 MG PO TABS: ORAL_TABLET | ORAL | 1 refills | Status: DC

## 2021-11-25 ENCOUNTER — Telehealth: Payer: Self-pay | Admitting: Internal Medicine

## 2021-11-25 NOTE — Telephone Encounter (Signed)
[facility] desk  Dr Elsworth Soho has referred patient to me for consideration of clinical trials for IPF. She is on ofev. Could you please set  a video visit with me for 11/26/21 at 12.15pm. If that wont work I ca do 6/13 or 6/16 in the afternoon or evening from [place]   Let her know this is an insurance visit to get to know me and also broadly clinical trials as a care option  Thanks    SIGNATURE    Dr. Brand Males, M.D., F.C.C.P,  Pulmonary and Critical Care Medicine Staff Physician, [facility] Director - Interstitial Lung Disease  Program  Medical Director - [facility] [place] at [place], [place], [address]  NPI Number:  NPI [id number] [place] Number: [id number]  Pager: [phone], If no answer  -> Check AMION or Try [phone] Telephone (clinical office): [phone] Telephone (research): [phone]  4:48 PM 11/25/2021

## 2021-11-26 NOTE — Telephone Encounter (Signed)
Patient is scheduled on 12/03/2021 at 12pm for a mychart visit.

## 2021-12-03 ENCOUNTER — Telehealth (INDEPENDENT_AMBULATORY_CARE_PROVIDER_SITE_OTHER): Payer: Medicare Other | Admitting: Internal Medicine

## 2021-12-03 ENCOUNTER — Telehealth: Payer: Self-pay | Admitting: Internal Medicine

## 2021-12-03 DIAGNOSIS — J84112 Idiopathic pulmonary fibrosis: Secondary | ICD-10-CM | POA: Diagnosis not present

## 2021-12-03 DIAGNOSIS — R634 Abnormal weight loss: Secondary | ICD-10-CM

## 2021-12-03 DIAGNOSIS — Z5181 Encounter for therapeutic drug level monitoring: Secondary | ICD-10-CM | POA: Diagnosis not present

## 2021-12-03 DIAGNOSIS — T50905A Adverse effect of unspecified drugs, medicaments and biological substances, initial encounter: Secondary | ICD-10-CM

## 2021-12-03 DIAGNOSIS — I272 Pulmonary hypertension, unspecified: Secondary | ICD-10-CM

## 2021-12-03 NOTE — Telephone Encounter (Signed)
[facility]  Saw patient for Dr Vassie Loll 12/03/2021 . Please see my instructions from video visit  THanks    SIGNATURE    Dr. Kalman Shan, M.D., F.C.C.P,  Pulmonary and Critical Care Medicine Staff Physician, [facility] Director - Interstitial Lung Disease  Program  Medical Director - [facility] Pulmonary Fibrosis [facility] at [place], [place], [address]  NPI Number:  NPI #[id number] [facility] Number: [id number]  Pager: [phone], If no answer  -> Check AMION or Try [phone] Telephone (clinical office): [phone] Telephone (research): [phone]  12:45 PM 12/03/2021

## 2021-12-03 NOTE — Patient Instructions (Addendum)
ICD-10-CM   1. IPF (idiopathic pulmonary fibrosis) (HCC)  J84.112     2. Medication monitoring encounter  Z51.81     3. Drug-induced weight loss  R63.4    T50.905A      #IPF   - slolwly progressive  Plan  -Do echocardiogram in the next few to several weeks to screen for  WHO group 3 pulmonary hypertension  - start esbriet as advised by Dr Vassie Loll  - this is the next best option in my opinion  - space 5-6h apart, take with meals, apply sunscreen  - discussed clinical trial as care option\  -Appreciate interest - no clinical trial for you right now -Clinical trials will be an option if you are intolerant to Esbriet OR you are stable on it for 3-4 months  Followup - video visit in [place] [id number]  with an APP to ensure esbriet uptake is well\ -Keep appointment with [name] in August 2023 - [facility] research team will monitor your chart and contact you in a few months to determine eligibility/interest for clinical trials at that moment -No specific follow-up required with Dr. Marchelle Gearing at the ILD center but I am periodically available

## 2021-12-03 NOTE — Progress Notes (Signed)
OV 12/03/2021  Subjective:  Patient ID: Debra Jimenez, female , DOB: [birth year] , age 71 y.o. , MRN: [id number] , ADDRESS: [place] [address] PCP Rosalee Kaufman, PA-C Patient Care Team: Rosine Door as PCP - General (Physician Assistant)  This Provider for this visit: Treatment Team:  Attending Provider: Brand Males, MD   Type of visit: Video Circumstance: COVID-19 national emergency but video stil in effect Identification of patient Debra Jimenez with 1950-07-19 and MRN [id number] - 2 person identifier Risks: Risks, benefits, limitations of telephone visit explained. Patient understood and verbalized agreement to proceed Anyone else on call: husbanf Patient location: her home This provider location: [facility]    12/03/2021 -  IPF    HPI Debra Jimenez 71 y.o. -Presents with her husband on this video visit.  Referred by Dr. Elsworth Soho to discuss clinical trials as a care option.  She prefers to see Dr. Elsworth Soho in [place] because she lives in [place], [place].  In this video visit she indicated to me that she has a diagnosis of IPF since April 2021.  She is not on oxygen.  She was initially on nintedanib for a few months in 2021 but then she had intolerance with increased liver enzymes.  Since then she been on 100 mg twice daily tolerating it well but she has progressive disease and therefore this has been stopped.  She also had 20 pound weight loss in the low-dose nintedanib.  Right now she has been prescribed pirfenidone/Esbriet.  She has not started this yet.  She said clinical trials as a care option was product by Dr. Elsworth Soho.  At this point in time is not fully sure what she should do but she prefers to take pirfenidone/Esbriet.  Initial part of the conversation was about questions on Esbriet.  I did indicate to her that while nintedanib and pirfenidone have not been compared head-to-head overall efficacy is the same.   Explained the side effect profile of Esbriet is being different but with overlapping features with nintedanib.  We talked about nausea, weight loss, anorexia, vomiting, rare diarrhea, occasional headaches and fogginess.  Also request liver enzyme monitoring.  Did indicate to her about the sunscreen and sunburn/sun rash.  Also talked about liver function test monitoring.  She is going to take it 5 to 6 hours apart with meals.  She has not started this yet.  I encouraged her to do it.  Did indicate to her that given progressive disease that pirfenidone is standard of care and this will be the next best option  Chart reivw shows lst ECHO 2021.  We talked about time also WHO group 3 pulmonary hypertension with ILD.  We discussed clinical trials in the context of renal disease and progressive disease.  Discussed current heavy interest in developing therapeutic agents for IPF.  Explained for her to be eligible she would have to be intolerant to the pirfenidone/Esbriet I will be stable on it for a few to several months or off not to take it.  She therefore is going to wait and see how she does with pirfenidone.  We discussed several aspects of clinical trials as a care option.  All documented below.   1. Scientific Purpose  Clinical research is designed to produce generalizable knowledge and to answer questions about the safety and efficacy of intervention(s) under study in order to determine whether or not they may be useful for the  care of future patients.  2. Study Procedures  Participation in a trial may involve procedures or tests, in addition to the intervention(s) under study, that are intended only or primarily to generate scientific knowledge and that are otherwise not necessary for patient care.   3. Uncertainty  For intervention(s) under study in clinical research, there often is less knowledge and more uncertainty about the risks and benefits to a population of trial participants than there is  when a doctor offers a patient standard interventions.   4. Adherence to Protocol  Administration of the intervention(s) under study is typically based on a strict protocol with defined dose, scheduling, and use or avoidance of concurrent medications, compared to administration of standard interventions.  5. Clinician as Investigator  Clinicians who are in health care settings provide treatment; in a clinical trial setting, they are also investigating safety and efficacy of an intervention. In otherwise your doctor or nurse practitioner can be wearing 2 hats - one as care giver another as Company secretary  6. Patient as Visual merchandiser Subject  Patients participating in research trials are research subjects or volunteers. In other words participating in research is 100% voluntary and at one's own free weill. The decision to participate or not participate will NOT affect patient care and the doctor-patient relationship in any way .    She also prefers to follow-up with Dr. Zenia Resides because of logistical reasons.    CT Chest data  No results found.    PFT     Latest Ref Rng & Units 10/07/2021    2:02 PM 02/19/2021   11:00 AM 01/13/2020    3:57 PM  PFT Results  FVC-Pre L 1.06  1.26  1.47   FVC-Predicted Pre % 34  40  46   FVC-Post L 1.11  1.21  1.51   FVC-Predicted Post % 35  38  47   Pre FEV1/FVC % % 88  89  81   Post FEV1/FCV % % 94  92  78   FEV1-Pre L 0.93  1.13  1.19   FEV1-Predicted Pre % 39  47  49   FEV1-Post L 1.05  1.11  1.17   DLCO uncorrected ml/min/mmHg 4.33  7.83  12.22   DLCO UNC% % 21  38  60   DLCO corrected ml/min/mmHg   12.22   DLCO COR %Predicted %   60   DLVA Predicted % 68  116  112   TLC L 2.24  3.45  3.49   TLC % Predicted % 43  66  67   RV % Predicted % 50  102  90        has a past medical history of Hypothyroidism, OA (osteoarthritis), and Wears glasses.   reports that she quit smoking about 38 years ago. Her smoking use included  cigarettes. She has a 7.50 pack-year smoking history. She has never used smokeless tobacco.  Past Surgical History:  Procedure Laterality Date   BLADDER TACK  2014  APPROX.   NO SLING   TOTAL HIP ARTHROPLASTY Right 07/17/2018   Procedure: TOTAL HIP ARTHROPLASTY ANTERIOR APPROACH;  Surgeon: Paralee Cancel, MD;  Location: [facility];  Service: Orthopedics;  Laterality: Right;  70 mins   VAGINAL HYSTERECTOMY  2003   w/  BSO    Allergies  Allergen Reactions   Atorvastatin Other (See Comments)     Muscle aches. Has tried 2 different statins and unable to tolerate    Immunization History  Administered  Date(s) Administered   Fluad Quad(high Dose 65+) 06/27/2018   Influenza, High Dose Seasonal PF 04/05/2017   Influenza,inj,quad, With Preservative 02/19/2018   Moderna Sars-Covid-2 Vaccination 07/11/2019, 08/16/2019   Pneumococcal Conjugate-13 05/25/2016   Tdap 01/13/2010, 03/12/2018    No family history on file.   Current Outpatient Medications:    albuterol (PROAIR HFA) 108 (90 Base) MCG/ACT inhaler, Inhale 2 puffs into the lungs every 6 (six) hours as needed for wheezing or shortness of breath., Disp: 6.7 g, Rfl: 3   ALPRAZolam (XANAX) 0.25 MG tablet, Take 0.25 mg by mouth daily as needed., Disp: , Rfl:    levothyroxine (SYNTHROID) 100 MCG tablet, Take 100 mcg by mouth daily before breakfast., Disp: , Rfl:    losartan (COZAAR) 50 MG tablet, Take 50 mg by mouth daily., Disp: , Rfl:    ondansetron (ZOFRAN) 4 MG tablet, Take 1 tablet (4 mg total) by mouth 2 (two) times daily as needed for nausea or vomiting., Disp: 30 tablet, Rfl: 1   Pirfenidone (ESBRIET) 267 MG TABS, Take 1 tab three times daily for 7 days, then 2 tabs three times daily for 7 days, then 3 tabs three times daily thereafter., Disp: 270 tablet, Rfl: 1   zolpidem (AMBIEN) 5 MG tablet, Take 5 mg by mouth at bedtime as needed., Disp: , Rfl:       Objective:   There were no vitals filed for this visit.  Estimated body mass  index is 21.4 kg/m as calculated from the following:   Height as of 11/10/21: 5' 5.5" (1.664 m).   Weight as of 11/10/21: 130 lb 9.6 oz (59.2 kg).  $Rem'@WEIGHTCHANGE'pRgq$ @  There were no vitals filed for this visit.   Physical Exam General: No distress. Looked well Neuro: Alert and Oriented x 3. GCS 15. Speech normal Psych: Pleasant HEENT: Normal upper airway. PEERL +. No post nasal drip        Assessment:       ICD-10-CM   1. IPF (idiopathic pulmonary fibrosis) ([place])  J84.112     2. Medication monitoring encounter  Z51.81     3. Drug-induced weight loss  R63.4    T50.905A          Plan:     Patient Instructions     ICD-10-CM   1. IPF (idiopathic pulmonary fibrosis) ([place])  J84.112     2. Medication monitoring encounter  Z51.81     3. Drug-induced weight loss  R63.4    T50.905A      #IPF   - slolwly progressive  Plan  -Do echocardiogram in the next few to several weeks to screen for  WHO group 3 pulmonary hypertension  - start esbriet as advised by Dr Elsworth Soho  - this is the next best option in my opinion  - space 5-6h apart, take with meals, apply sunscreen  - discussed clinical trial as care option\  -Appreciate interest - no clinical trial for you right now -Clinical trials will be an option if you are intolerant to [place] you are stable on it for 3-4 months  Followup - video visit in [place]  with an APP to ensure esbriet uptake is well\ -Keep appointment with Dr. Elsworth Soho in August 2023 - [facility] research team will monitor your chart and contact you in a few months to determine eligibility/interest for clinical trials at that moment -No specific follow-up required with Dr. Chase Caller at the ILD center but I am periodically available  ( Level 05 visit:  Estb 40-54 min  visit type: on-site physical face to visit  in total care time and counseling or/and coordination of care by this undersigned MD - Dr Brand Males. This includes one or more of the following  on this same day 12/03/2021: pre-charting, chart review, note writing, documentation discussion of test results, diagnostic or treatment recommendations, prognosis, risks and benefits of management options, instructions, education, compliance or risk-factor reduction. It excludes time spent by the [place] or office staff in the care of the patient. Actual time 9 min)   SIGNATURE    Dr. Brand Males, M.D., F.C.C.P,  Pulmonary and Critical Care Medicine Staff Physician, [place] Director - Interstitial Lung Disease  Program  P[facility] [place] [facility] [place], [place], [address]  Pager: [phone], If no answer or between  15:00h - 7:00h: call [phone] Telephone: [phone]  12:43 PM 12/03/2021

## 2021-12-08 NOTE — Telephone Encounter (Signed)
Called and spoke with patient to get her scheduled in mid July with APP for follow up per Dr. Marchelle Gearing. Also placed order for her to get an Echo done. Advised her she would get a call to get it scheduled. She expressed understanding. Nothing further needed at this time.

## 2021-12-14 ENCOUNTER — Telehealth: Payer: Self-pay | Admitting: Pulmonary Disease

## 2021-12-15 NOTE — Telephone Encounter (Signed)
Patient needs LFTs repeated one month after starting Esbriet. Future lab order for CM[facility] is in place  Chesley Mires, PharmD, MPH, BCPS, CPP Clinical Pharmacist (Rheumatology and Pulmonology)

## 2021-12-15 NOTE — Telephone Encounter (Signed)
Last labwork was done 5/24.  Called and spoke with pt to see when she started taking the Esbriet and pt said she started taking it 6/19. Stated to pt that labwork will need to be done at some point since she did begin taking the Esbriet but stated to her that I would check to see when labs will need to be done.   Routing to pharmacy team for assistance.

## 2021-12-15 NOTE — Telephone Encounter (Signed)
Called and spoke with pt letting her know the info per pharmacy team and she verbalized understanding. Nothing further needed. 

## 2021-12-27 DIAGNOSIS — L570 Actinic keratosis: Secondary | ICD-10-CM | POA: Diagnosis not present

## 2021-12-27 DIAGNOSIS — X32XXXA Exposure to sunlight, initial encounter: Secondary | ICD-10-CM | POA: Diagnosis not present

## 2021-12-29 ENCOUNTER — Ambulatory Visit (HOSPITAL_COMMUNITY)
Admission: RE | Admit: 2021-12-29 | Discharge: 2021-12-29 | Disposition: A | Discharge status: Home / Self Care | Payer: Medicare Other | Source: Ambulatory Visit | Attending: Internal Medicine | Admitting: Internal Medicine

## 2021-12-29 DIAGNOSIS — I272 Pulmonary hypertension, unspecified: Secondary | ICD-10-CM

## 2021-12-29 LAB — ECHOCARDIOGRAM COMPLETE
Area-P 1/2: 1.72 cm2
S' Lateral: 2.2 cm

## 2021-12-29 NOTE — Progress Notes (Signed)
*  PRELIMINARY RESULTS* Echocardiogram 2D Echocardiogram has been performed.  Stacey Drain 12/29/2021, 1:45 PM

## 2021-12-31 ENCOUNTER — Telehealth: Payer: Self-pay | Admitting: Pulmonary Disease

## 2021-12-31 DIAGNOSIS — J84112 Idiopathic pulmonary fibrosis: Secondary | ICD-10-CM

## 2021-12-31 MED ORDER — PIRFENIDONE 267 MG PO TABS: 801.0000 mg | ORAL_TABLET | Freq: Three times a day (TID) | ORAL | 1 refills | Status: DC

## 2021-12-31 NOTE — Telephone Encounter (Signed)
Refill sent for ESBRIET to [facility] ([facility]) for Esbriet: [phone]  Dose: 801 mg three times daily  Last OV: 11/10/21 Provider: [name]  Next OV: 01/05/22 Rhunette Croft, NP)  Chesley Mires, PharmD, MPH, BCPS Clinical Pharmacist (Rheumatology and Pulmonology)

## 2022-01-05 ENCOUNTER — Other Ambulatory Visit (HOSPITAL_COMMUNITY)
Admission: RE | Admit: 2022-01-05 | Discharge: 2022-01-05 | Disposition: A | Discharge status: Home / Self Care | Payer: Medicare Other | Source: Ambulatory Visit | Attending: Pulmonary Disease | Admitting: Pulmonary Disease

## 2022-01-05 ENCOUNTER — Encounter: Payer: Self-pay | Admitting: Nurse Practitioner

## 2022-01-05 ENCOUNTER — Telehealth (INDEPENDENT_AMBULATORY_CARE_PROVIDER_SITE_OTHER): Payer: Medicare Other | Admitting: Nurse Practitioner

## 2022-01-05 DIAGNOSIS — J84112 Idiopathic pulmonary fibrosis: Secondary | ICD-10-CM | POA: Diagnosis not present

## 2022-01-05 DIAGNOSIS — Z5181 Encounter for therapeutic drug level monitoring: Secondary | ICD-10-CM | POA: Insufficient documentation

## 2022-01-05 LAB — COMPREHENSIVE METABOLIC PANEL
ALT: 12 U/L (ref 0–44)
AST: 18 U/L (ref 15–41)
Albumin: 3.8 g/dL (ref 3.5–5.0)
Alkaline Phosphatase: 66 U/L (ref 38–126)
Anion gap: 6 (ref 5–15)
BUN: 21 mg/dL (ref 8–23)
CO2: 26 mmol/L (ref 22–32)
Calcium: 9 mg/dL (ref 8.9–10.3)
Chloride: 107 mmol/L (ref 98–111)
Creatinine, Ser: 0.66 mg/dL (ref 0.44–1.00)
GFR, Estimated: >60 mL/min (ref >60)
Glucose, Bld: 183 mg/dL — ABNORMAL HIGH (ref 70–99)
Potassium: 3.7 mmol/L (ref 3.5–5.1)
Sodium: 139 mmol/L (ref 135–145)
Total Bilirubin: 0.4 mg/dL (ref 0.3–1.2)
Total Protein: 7 g/dL (ref 6.5–8.1)

## 2022-01-05 NOTE — Patient Instructions (Addendum)
Continue Albuterol inhaler 2 puffs every 6 hours as needed for shortness of breath or wheezing. Notify if symptoms persist despite rescue inhaler/neb use. Order for approved albuterol inhaler (Proair) placed today. -Continue Esbriet 3 pills Three times a day. Take with food. Wear sunscreen when outside  -Continue zofran 4 mg every 8 hours as needed    Frequent, high protein, small meals throughout the day   Go get your labs drawn today at [facility]   Follow up with [name] in August as scheduled. If symptoms do not improve or worsen, please contact office for sooner follow up or seek emergency care.

## 2022-01-05 NOTE — Progress Notes (Signed)
Patient ID: Debra Jimenez, female     DOB: 05-Dec-1950, 71 y.o.      MRN: [id number]  No chief complaint on file.   Virtual Visit via Video Note  I connected with Venia Minks on 01/05/22 at 12:00 PM EDT by a video enabled telemedicine application and verified that I am speaking with the correct person using two identifiers.  Location: Patient: Home Provider: Office   I discussed the limitations of evaluation and management by telemedicine and the availability of in person appointments. The patient expressed understanding and agreed to proceed.  History of Present Illness: 71 year old female, former smoker (15 pack years) followed for ILD. She is a patient Dr. Reginia Naas and last seen in office 12/03/2021 for consult with Dr. Marchelle Gearing.  Past medical history significant for hypertension and anxiety.  07/07/2021: OV with [name] four month f/u. PFTs 2022 with 10% decrease in FVC, decrease in DLCO from 2021 to 2022, stable TLC - progressive phenotype. Significant nausea with Ofev - previously decreased to 100 mg Twice daily. Viral infection around thanksgiving - provided with z pack which gave her stomach problems. Enrolled in pulm rehab previously but had to disengage over past 2 months. Breathing stable.    07/07/2021: OV with [name]. Continued Ofev at 100 mg Twice daily; did not believe she would tolerate higher doses. LFTs stable. Advised re-enrollment in pulm rehab. Repeat PFTs April 2023 and HRCT May 2023.    08/09/2021: OV with Khiley Lieser NP.  Worsening shortness of breath as well as some episodes of feeling weak and panting.  Felt she had more chest congestion and postnasal drip, which was slowly improving.  Felt like her breathing had slightly improved but then began having some chest tightness with deep breathing and a very rare dry cough.  Felt like albuterol did not help as much.  Reported that her oxygen has gotten low of 72% on room air at home.  Walking oximetry in office with SPO2 low  89% on room air, recovered to 90s without oxygen therapy.  CXR showed chronic changes.  COVID symptoms a D-dimer was obtained which was borderline positive.  CTA was ordered which is negative for PE; concern for possible pneumonia.  No mention of significant progression of fibrosis.  Treated with Augmentin course and prednisone.  Potassium noted to be low on [place].  Was placed on 20 mEq daily and advised to follow-up with PCP.  Close follow-up.  Plans for repeat PFTs in April 2023 and HRCT in May 2023   09/03/2021: OV with Lallie Strahm NP for follow-up.  She reports feeling better and has not had any more episodes of particular lightheadedness.  She still continues to have some shortness of breath with exertion that she feels has been progressive over time.  Has not noticed any decreased oxygen levels at home since our last visit. Cough has mostly resolved. Denies orthopnea, PND, lower extremity swelling or hemoptysis. Rare use of albuterol. Continues on Ofev without difficulties. Does report some recent constipation which she began taking miralax for yesterday.  Scheduled PFTs.  Plan for repeat HRCT chest in May.  Encouraged to return to pulmonary rehab in March.  11/10/2021: OV with [name] for follow-up after PFTs.  She had a 10% drop in FVC from 2019 to now-noted to be progressive phenotype.  Developed significant nausea with Ofev and this was decreased to 100 mg twice daily; tolerating well.  She was previously enrolled in pulmonary rehab but had to drop out.  Progression of disease despite Ofev. She was provided with portable O2. Recheck LFT and hypokalemia. Decided to trial change to Esbriet. Referred to Dr. Marchelle Gearing at [facility] for one time consult.  12/03/2021: Consult with Dr. Marchelle Gearing via virtual visit.  Referred by [name] to discuss clinical trials as care option for her IPF.  She prefers to remain with [name] in [place] because she lives in [place].  She has had many complications with Ofev.   Recently switched to Esbriet which she has not started yet.  Reviewed potential side effects of Esbriet.  Advised to take it at least 5 to 6 hours apart with meals.  Reviewed that given progression of disease, as.  It is standard of care and next best option.  Recommended echocardiogram to rule out who group 3 pulmonary hypertension.  Discussed that she will need to try Esbriet and if intolerant, she could move forward with clinical trials.  01/05/2022: Today-follow-up Patient presents today via virtual visit for follow-up after being changed from Ofev to Esbriet.  She has been able to reach optimal dose of 3 pills 3 times a day.  She feels like she has been tolerating it relatively well.  She occasionally gets nauseous but not nearly as bad as she did on the Ofev.  She will take a Zofran when this happens and this corrects it.  She is able to go about her activities as normal.  She does also have some dizziness with position changes after after taking but does not feel like it is that severe.  She has not had any syncopal episodes and feels like this resolves quickly.  She has had some issues with insomnia, despite using Ambien.  This used to help her sleep at night but she has noticed that since she got up to the full dose of Esbriet, she has been unable to fall asleep as easily.  She has been taking her last dose around 9/9:30 PM.  Appetite is relatively unchanged.  She has not had any further weight loss since we saw her last.  Breathing overall feels relatively unchanged and cough is stable.  She denies any hemoptysis, lower extremity swelling, wheezing, fevers, night sweats, palpitations, PND.  She rarely uses albuterol rescue inhaler.  Not currently on any oxygen.  Allergies  Allergen Reactions   Atorvastatin Other (See Comments)     Muscle aches. Has tried 2 different statins and unable to tolerate   Immunization History  Administered Date(s) Administered   Fluad Quad(high Dose 65+) 06/27/2018    Influenza, High Dose Seasonal PF 04/05/2017   Influenza,inj,quad, With Preservative 02/19/2018   Moderna Sars-Covid-2 Vaccination 07/11/2019, 08/16/2019   Pneumococcal Conjugate-13 05/25/2016   Tdap 01/13/2010, 03/12/2018   Past Medical History:  Diagnosis Date   Hypothyroidism    OA (osteoarthritis)    RIGHT HIP AND LEFT HIP   Wears glasses     Tobacco History: Social History   Tobacco Use  Smoking Status Former   Packs/day: 0.50   Years: 15.00   Total pack years: 7.50   Types: Cigarettes   Quit date: 1985   Years since quitting: 38.5  Smokeless Tobacco Never   Counseling given: Not Answered   Outpatient Medications Prior to Visit  Medication Sig Dispense Refill   albuterol (PROAIR HFA) 108 (90 Base) MCG/ACT inhaler Inhale 2 puffs into the lungs every 6 (six) hours as needed for wheezing or shortness of breath. 6.7 g 3   ALPRAZolam (XANAX) 0.25 MG tablet Take  0.25 mg by mouth daily as needed.     levothyroxine (SYNTHROID) 100 MCG tablet Take 100 mcg by mouth daily before breakfast.     losartan (COZAAR) 50 MG tablet Take 50 mg by mouth daily.     ondansetron (ZOFRAN) 4 MG tablet Take 1 tablet (4 mg total) by mouth 2 (two) times daily as needed for nausea or vomiting. 30 tablet 1   Pirfenidone (ESBRIET) 267 MG TABS Take 3 tablets (801 mg total) by mouth 3 (three) times daily with meals. 270 tablet 1   zolpidem (AMBIEN) 5 MG tablet Take 5 mg by mouth at bedtime as needed.     No facility-administered medications prior to visit.     Review of Systems:   Constitutional: No weight loss or gain, night sweats, fevers, chills. +fatigue  HEENT: No headaches, difficulty swallowing, tooth/dental problems, or sore throat. No sneezing, itching, ear ache, nasal congestion, or post nasal drip CV:  +dizziness. No chest pain, orthopnea, PND, swelling in lower extremities, anasarca, palpitations, syncope Resp: +shortness of breath with exertion, baseline; chronic cough. No excess mucus  or change in color of mucus. No hemoptysis. No wheezing.  No chest wall deformity GI:  +occasional nausea (resolves with zofran). No heartburn, indigestion, abdominal pain, vomiting, diarrhea, change in bowel habits, loss of appetite, bloody stools.  Skin: No rash, lesions, ulcerations MSK:  No joint pain or swelling.  No decreased range of motion.  No back pain. Neuro: No confusion. No gait abnormalities. No numbness/tingling.  Psych: No depression or anxiety. Mood stable.   Observations/Objective: Patient is well-developed, well-nourished in no acute distress. A&Ox3. Resting comfortably at home. Unlabored, regular breathing. Speech is clear and coherent with logical content.   12/2017 PFTs: Moderate intraparenchymal restriction.  Ratio 88.  FVC 1.7 (56), TLC 3.21 (64), DLCO 8.7 (44) 10/2019 HRCT: Probable UIP, basilar predominance, early honeycombing, mild mediastinal lymphadenopathy 12/2019 PFTs: FVC 1.47 (46), TLC 3.49 (67), DLCO 60% 10/2020 HRCT: Consistent with UIP, increased in comparison to 2021 study.  Unchanged prominent LNs. 02/2021 PFTs: FVC 1.26 (40%), TLC 3.45 (66%), DLCO 38% 08/10/2021 CTA chest: No evidence of PE.  Atherosclerosis.  Redemonstrated multiple mildly enlarged mediastinal and bilateral hilar lymph nodes.  Slight increase in AP window node to 1.4 cm from 1.2 cm.  Moderate changes of pulmonary fibrosis throughout both lungs, overall similar in appearance to previous.  Patchy areas of increased groundglass attenuation most pronounced within the upper lobes bilaterally, concerning for superimposed infectious or inflammatory process. 10/07/2021 PFTs: FVC 34, FEV1 39, ratio 94, TLC 43, DLCOunc 21%.  Severe restrictive defect with very severe diffusion defect. 11/08/2021 HRCT chest: Atherosclerosis.  Unchanged prominent mediastinal and hilar lymph nodes.  Slightly worsened moderate pulmonary fibrosis in a pattern with apical to basal gradient featuring irregular peripheral interstitial  opacity, septal thickening, traction bronchiectasis and subpleural bronchiolectasis.  There is honeycombing at the lung bases. 12/29/2021 echocardiogram: Mild apical septum hypokinesis , related to bundle branch block.  EF 55 to 60%.  G1 DD.  RV size and function is normal.  There is normal PASP.  Mild MR.  Assessment and Plan: IPF (idiopathic pulmonary fibrosis) (HCC) Progressive phenotype.  She has had a decline in her PFTs and worsening on her recent HRCT.  Symptoms overall stable since we saw her last.  She was transition from Ofev to Esbriet due to side effects.  She is currently on Esbriet 3 pills 3 times daily and having some mild side effects, which not significantly impacting her.  Advised her to monitor the dizziness and nausea and notify if they do not improve or worsen.  Also advised that she take her last dose of Esbriet a little bit earlier to see if this helps with insomnia.  We will recheck LFTs today -orders previously placed; she will go to [facility] for lab draw.  She never went back to pulmonary rehab.  Felt like she could not participate.  Continue albuterol as needed.  Patient Instructions  Continue Albuterol inhaler 2 puffs every 6 hours as needed for shortness of breath or wheezing. Notify if symptoms persist despite rescue inhaler/neb use. Order for approved albuterol inhaler (Proair) placed today. -Continue Esbriet 3 pills Three times a day. Take with food. Wear sunscreen when outside  -Continue zofran 4 mg every 8 hours as needed    Frequent, high protein, small meals throughout the day   Go get [facility]   Follow up with [name] in August as scheduled. If symptoms do not improve or worsen, please contact office for sooner follow up or seek emergency care.     I discussed the assessment and treatment plan with the patient. The patient was provided an opportunity to ask questions and all were answered. The patient agreed with the plan and  demonstrated an understanding of the instructions.   The patient was advised to call back or seek an in-person evaluation if the symptoms worsen or if the condition fails to improve as anticipated.  I provided 25 minutes of non-face-to-face time during this encounter.   Noemi Chapel, NP

## 2022-01-05 NOTE — Assessment & Plan Note (Addendum)
Progressive phenotype.  She has had a decline in her PFTs and worsening on her recent HRCT.  Symptoms overall stable since we saw her last.  She was transition from Ofev to Esbriet due to side effects.  She is currently on Esbriet 3 pills 3 times daily and having some mild side effects, which not significantly impacting her.  Advised her to monitor the dizziness and nausea and notify if they do not improve or worsen.  Also advised that she take her last dose of Esbriet a little bit earlier to see if this helps with insomnia.  We will recheck LFTs today -orders previously placed; she will go to [facility] for lab draw.  She never went back to pulmonary rehab.  Felt like she could not participate.  Continue albuterol as needed.  Patient Instructions  Continue Albuterol inhaler 2 puffs every 6 hours as needed for shortness of breath or wheezing. Notify if symptoms persist despite rescue inhaler/neb use. Order for approved albuterol inhaler (Proair) placed today. -Continue Esbriet 3 pills Three times a day. Take with food. Wear sunscreen when outside  -Continue zofran 4 mg every 8 hours as needed    Frequent, high protein, small meals throughout the day   Go get your labs drawn today at [facility]   Follow up with [name] in August as scheduled. If symptoms do not improve or worsen, please contact office for sooner follow up or seek emergency care.

## 2022-01-06 ENCOUNTER — Other Ambulatory Visit: Payer: Self-pay | Admitting: Pharmacist

## 2022-01-06 ENCOUNTER — Other Ambulatory Visit (HOSPITAL_COMMUNITY): Payer: Medicare Other

## 2022-01-06 DIAGNOSIS — J84112 Idiopathic pulmonary fibrosis: Secondary | ICD-10-CM

## 2022-01-06 MED ORDER — PIRFENIDONE 801 MG PO TABS: 801.0000 mg | ORAL_TABLET | Freq: Three times a day (TID) | ORAL | 5 refills | Status: DC

## 2022-01-06 NOTE — Telephone Encounter (Signed)
Refill sent for ESBRIET to [facility]) for Esbriet: [phone]. Called patient and she states she is tolerating medication without issue. She is receiving shipment tomorrow which will be 3 tabs of 267mg  tablets three times daily.  Dose: 801 mg three times daily  Last OV: 01/05/22 - video visit Provider: Dr. 01/07/22 LFTs on 01/05/22 wnl  Next OV: 02/08/22  02/10/22, PharmD, MPH, BCPS Clinical Pharmacist (Rheumatology and Pulmonology)

## 2022-01-17 DIAGNOSIS — R300 Dysuria: Secondary | ICD-10-CM | POA: Diagnosis not present

## 2022-01-17 DIAGNOSIS — Z6821 Body mass index (BMI) 21.0-21.9, adult: Secondary | ICD-10-CM | POA: Diagnosis not present

## 2022-02-08 ENCOUNTER — Ambulatory Visit: Payer: Medicare Other | Admitting: Pulmonary Disease

## 2022-02-08 ENCOUNTER — Other Ambulatory Visit (HOSPITAL_COMMUNITY)
Admission: RE | Admit: 2022-02-08 | Discharge: 2022-02-08 | Disposition: A | Discharge status: Home / Self Care | Payer: Medicare Other | Source: Ambulatory Visit | Attending: Pulmonary Disease | Admitting: Pulmonary Disease

## 2022-02-08 ENCOUNTER — Encounter: Payer: Self-pay | Admitting: Pulmonary Disease

## 2022-02-08 ENCOUNTER — Other Ambulatory Visit: Payer: Self-pay

## 2022-02-08 VITALS — BP 132/62 | HR 68 | Temp 98.4°F | Ht 65.5 in | Wt 129.4 lb

## 2022-02-08 DIAGNOSIS — J84112 Idiopathic pulmonary fibrosis: Secondary | ICD-10-CM | POA: Insufficient documentation

## 2022-02-08 DIAGNOSIS — Z5181 Encounter for therapeutic drug level monitoring: Secondary | ICD-10-CM | POA: Insufficient documentation

## 2022-02-08 DIAGNOSIS — J9611 Chronic respiratory failure with hypoxia: Secondary | ICD-10-CM

## 2022-02-08 LAB — HEPATIC FUNCTION PANEL
ALT: 11 U/L (ref 0–44)
AST: 17 U/L (ref 15–41)
Albumin: 4.1 g/dL (ref 3.5–5.0)
Alkaline Phosphatase: 77 U/L (ref 38–126)
Bilirubin, Direct: 0.1 mg/dL (ref 0.0–0.2)
Indirect Bilirubin: 0.7 mg/dL (ref 0.3–0.9)
Total Bilirubin: 0.8 mg/dL (ref 0.3–1.2)
Total Protein: 7.9 g/dL (ref 6.5–8.1)

## 2022-02-08 MED ORDER — ONDANSETRON HCL 4 MG PO TABS: 4.0000 mg | ORAL_TABLET | Freq: Two times a day (BID) | ORAL | 1 refills | Status: DC | PRN

## 2022-02-08 NOTE — Patient Instructions (Addendum)
X Ambulatory sat   X LFTs today & again in  1 month  X refill on zofran  Glad you are tolerating better

## 2022-02-08 NOTE — Assessment & Plan Note (Signed)
-   With progressive phenotype She was unable to tolerate full dose Ofev, heart disease progression and was switched to Esbriet which she is tolerating better at full dose. We will check LFTs today and repeat in 1 month We will monitor for disease progression with checking ambulatory saturation Plan for high-resolution CT chest in 6 months and PFTs Referred her to pulm fibrosis support group

## 2022-02-08 NOTE — Assessment & Plan Note (Signed)
She would like to hold off on oxygen unless absolutely necessary,

## 2022-02-08 NOTE — Progress Notes (Signed)
   Subjective:    Patient ID: Debra Jimenez, female    DOB: Apr 05, 1951, 71 y.o.   MRN: [id number]  HPI  71 yo remote smoker for follow-up of ILD, symptom onset since 2019 -New onset hypertension on lisinopril   She had 10% drop in FVC from 20 19-20 21 >> progressive phenotype.   she has had disease progression on 100 mg of twice daily as demonstrated by HRCT and drop in lung function.  Unfortunately she has not tolerated full dose of Ofev  Chief Complaint  Patient presents with   Follow-up    Breathing is doing okay.  [name] is working okay states she feels a little better and having less side effects    Last OV  10/2021 >> noted to desaturate. She met my partner Dr. Chase Caller and discussed ILD research studies and opted to switch to Esbriet.  She is now tolerating 3 pills thrice daily and has changed to 800 mg p.o.  Nausea is controlled with Zofran LFTs were normal 12/2021 She was able to ambulate with desaturation to 90% after 3 laps  She has several questions which we discussed At last imaging and PFTs  Significant tests/ events reviewed   HRCT 10/2021 >> Slightly worsened moderate pulmonary fibrosis , c/w UIP HRCT 10/2020 >> c/w UIP, increased compared to 2021, unchanged prominent LNs     HRCT 10/2019 "probable" UIP , basilar predominance, early honeycombing , mild mediastinal lymphadenopathy     PFTs 09/2021  FVC 1.11/35%, TLC 44%, DLCO 4.33/21%   PFTs 02/2021 -FVC 1.26/40%, TLC 3.45/66%, DLCO 7.83/38%   PFTs 12/2019 -FVC 1.47/46%, TLC 3.49/67%, DLCO 12.2/60%   PFTs 12/2017  -[facility] -moderate intraparenchymal restriction, ratio 88, FVC 56%/1.70, DLCO 8.7/44%, TLC 3.21/64%   Review of Systems neg for any significant sore throat, dysphagia, itching, sneezing, nasal congestion or excess/ purulent secretions, fever, chills, sweats, unintended wt loss, pleuritic or exertional cp, hempoptysis, orthopnea pnd or change in chronic leg swelling. Also denies presyncope,  palpitations, heartburn, abdominal pain, nausea, vomiting, diarrhea or change in bowel or urinary habits, dysuria,hematuria, rash, arthralgias, visual complaints, headache, numbness weakness or ataxia.     Objective:   Physical Exam   Gen. Pleasant, well-nourished, in no distress ENT - no thrush, no pallor/icterus,no post nasal drip Neck: No JVD, no thyromegaly, no carotid bruits Lungs: no use of accessory muscles, no dullness to percussion, bibasal dry crackles Cardiovascular: Rhythm regular, heart sounds  normal, no murmurs or gallops, no peripheral edema Musculoskeletal: No deformities, no cyanosis or clubbing         Assessment & Plan:

## 2022-02-19 ENCOUNTER — Telehealth: Payer: Self-pay | Admitting: Internal Medicine

## 2022-02-19 DIAGNOSIS — R931 Abnormal findings on diagnostic imaging of heart and coronary circulation: Secondary | ICD-10-CM

## 2022-02-19 NOTE — Telephone Encounter (Signed)
Debra Jimenez (cc Kathy Breach)  I saw patient in June 2023 as a video consult for IPF and to disuss trials and Rx options. Ordered echo . Results likely given to her by Florentina Addison during followup July 2023  but I think patient needs to see a cardiologist because the echo EF has dropped since stress test in 2021 and there are focal wall motion abnormaities  Plan  -r efer [facility] cardiology in [place] but only if they can see patient in 2-4 weeks. If not, [place] [place]  Thanks   IMPRESSIONS     1. Mild apical septum hypokinesis which would be related to bundle branch  block. Left ventricular ejection fraction, by estimation, is 55 to 60%.  The left ventricle has normal function. The left ventricle demonstrates  regional wall motion abnormalities  (see scoring diagram/findings for description). Left ventricular diastolic  parameters are consistent with Grade I diastolic dysfunction (impaired  relaxation).   2. Right ventricular systolic function is normal. The right ventricular  size is normal. There is normal pulmonary artery systolic pressure. The  estimated right ventricular systolic pressure is 34.4 mmHg.   3. The mitral valve is grossly normal. Mild mitral valve regurgitation.  No evidence of mitral stenosis.   4. The aortic valve is tricuspid. Aortic valve regurgitation is not  visualized. No aortic stenosis is present.   5. The inferior vena cava is normal in size with greater than 50%  respiratory variability, suggesting right atrial pressure of 3 mmHg.   SIGNATURE    Dr. Kalman Shan, M.D., F.C.C.P,  Pulmonary and Critical Care Medicine Staff Physician, [facility] Director - Interstitial Lung Disease  Program  Medical Director - [facility] Pulmonary Fibrosis [facility] at [place], [place], [address]  NPI Number:  NPI #[id number] [facility] Number: [id number]  Pager: [phone], If no answer  -> Check AMION or Try [phone] Telephone (clinical office): [phone] Telephone (research): [phone]  10:31 PM 02/19/2022

## 2022-02-23 NOTE — Telephone Encounter (Signed)
Called and spoke with pt letting her know the info per MR and she verbalized understanding. Referral placed to cardiology. Nothing further needed.

## 2022-02-24 DIAGNOSIS — L570 Actinic keratosis: Secondary | ICD-10-CM | POA: Diagnosis not present

## 2022-02-24 DIAGNOSIS — X32XXXD Exposure to sunlight, subsequent encounter: Secondary | ICD-10-CM | POA: Diagnosis not present

## 2022-03-07 ENCOUNTER — Telehealth: Payer: Self-pay

## 2022-03-07 DIAGNOSIS — J84112 Idiopathic pulmonary fibrosis: Secondary | ICD-10-CM

## 2022-03-07 NOTE — Telephone Encounter (Signed)
Called to remind patient about LFTs due this week. She voiced understanding and is planning to come before Friday. Nothing further needed

## 2022-03-08 DIAGNOSIS — J84112 Idiopathic pulmonary fibrosis: Secondary | ICD-10-CM | POA: Diagnosis not present

## 2022-03-09 DIAGNOSIS — Z23 Encounter for immunization: Secondary | ICD-10-CM | POA: Diagnosis not present

## 2022-03-09 LAB — HEPATIC FUNCTION PANEL
ALT: 8 IU/L (ref 0–32)
AST: 16 IU/L (ref 0–40)
Albumin: 4.3 g/dL (ref 3.8–4.8)
Alkaline Phosphatase: 86 IU/L (ref 44–121)
Bilirubin Total: 0.3 mg/dL (ref 0.0–1.2)
Bilirubin, Direct: <0.1 mg/dL (ref 0.00–0.40)
Total Protein: 6.9 g/dL (ref 6.0–8.5)

## 2022-03-25 DIAGNOSIS — J40 Bronchitis, not specified as acute or chronic: Secondary | ICD-10-CM | POA: Diagnosis not present

## 2022-03-25 DIAGNOSIS — J329 Chronic sinusitis, unspecified: Secondary | ICD-10-CM | POA: Diagnosis not present

## 2022-03-25 DIAGNOSIS — Z6820 Body mass index (BMI) 20.0-20.9, adult: Secondary | ICD-10-CM | POA: Diagnosis not present

## 2022-04-01 DIAGNOSIS — G4700 Insomnia, unspecified: Secondary | ICD-10-CM | POA: Diagnosis not present

## 2022-04-01 DIAGNOSIS — T466X5A Adverse effect of antihyperlipidemic and antiarteriosclerotic drugs, initial encounter: Secondary | ICD-10-CM | POA: Diagnosis not present

## 2022-04-01 DIAGNOSIS — M791 Myalgia, unspecified site: Secondary | ICD-10-CM | POA: Diagnosis not present

## 2022-04-01 DIAGNOSIS — I10 Essential (primary) hypertension: Secondary | ICD-10-CM | POA: Diagnosis not present

## 2022-04-01 DIAGNOSIS — J849 Interstitial pulmonary disease, unspecified: Secondary | ICD-10-CM | POA: Diagnosis not present

## 2022-04-01 DIAGNOSIS — E039 Hypothyroidism, unspecified: Secondary | ICD-10-CM | POA: Diagnosis not present

## 2022-04-01 DIAGNOSIS — Z6820 Body mass index (BMI) 20.0-20.9, adult: Secondary | ICD-10-CM | POA: Diagnosis not present

## 2022-04-18 ENCOUNTER — Ambulatory Visit (INDEPENDENT_AMBULATORY_CARE_PROVIDER_SITE_OTHER): Payer: Medicare Other | Admitting: Pulmonary Disease

## 2022-04-18 ENCOUNTER — Encounter: Payer: Self-pay | Admitting: Pulmonary Disease

## 2022-04-18 DIAGNOSIS — J84112 Idiopathic pulmonary fibrosis: Secondary | ICD-10-CM

## 2022-04-18 NOTE — Progress Notes (Signed)
   Subjective:    Patient ID: Debra Jimenez, female    DOB: April 21, 1951, 71 y.o.   MRN: [id number]  HPI  71 yo remote smoker for follow-up of ILD, symptom onset since 2019 -New onset hypertension on lisinopril   She had 10% drop in FVC from 20 19-20 21 >> progressive phenotype.   she has had disease progression on 100 mg of twice daily as demonstrated by HRCT and drop in lung function.  Unfortunately she has not tolerated full dose of Ofev 12/2021 Switched to [employer]  Patient presents with   Follow-up    Has had more stomach issues lately. Feels breathing is worse. Wants to discuss covid vax    She reports constipation with this.. She reports breaking out on her left forearm after being out in the sunlight.  She was given doxycycline by her PCP for an episode of sinusitis and she did not tolerate this at all due to nausea vomiting. She feels her breathing is about the same, does get short of breath on extreme exertion We reviewed LFTs which were normal in September On ambulation saturation dropped from 96 to 92% on third lap  She has lost weight from 138 pounds in January to 129 pounds around starting pirfenidone to 125 pounds now   Significant tests/ events reviewed  HRCT 10/2021 >> Slightly worsened moderate pulmonary fibrosis , c/w UIP HRCT 10/2020 >> c/w UIP, increased compared to 2021, unchanged prominent LNs     HRCT 10/2019 "probable" UIP , basilar predominance, early honeycombing , mild mediastinal lymphadenopathy     PFTs 09/2021  FVC 1.11/35%, TLC 44%, DLCO 4.33/21%   PFTs 02/2021 -FVC 1.26/40%, TLC 3.45/66%, DLCO 7.83/38%   PFTs 12/2019 -FVC 1.47/46%, TLC 3.49/67%, DLCO 12.2/60%   PFTs 12/2017  -[facility] -moderate intraparenchymal restriction, ratio 88, FVC 56%/1.70, DLCO 8.7/44%, TLC 3.21/64%  Review of Systems    neg for any significant sore throat, dysphagia, itching, sneezing, nasal congestion or excess/ purulent secretions, fever, chills,  sweats, unintended wt loss, pleuritic or exertional cp, hempoptysis, orthopnea pnd or change in chronic leg swelling. Also denies presyncope, palpitations, heartburn, abdominal pain, nausea, vomiting, diarrhea or change in bowel or urinary habits, dysuria,hematuria, rash, arthralgias, visual complaints, headache, numbness weakness or ataxia.  Objective:   Physical Exam  Gen. Pleasant, well-nourished, in no distress ENT - no thrush, no pallor/icterus,no post nasal drip Neck: No JVD, no thyromegaly, no carotid bruits Lungs: no use of accessory muscles, no dullness to percussion, l bibasal rales no rhonchi  Cardiovascular: Rhythm regular, heart sounds  normal, no murmurs or gallops, no peripheral edema Musculoskeletal: No deformities, no cyanosis or clubbing        Assessment & Plan:

## 2022-04-18 NOTE — Assessment & Plan Note (Addendum)
We once again discussed side effect profile of pirfenidone.  We discussed using MiraLAX for constipation and sunscreen/protective clothing for photosensitivity.  Avoid doxycycline which may compound side effects. Feel that lung disease is stable Repeat LFTs in 3 months We will plan on repeating high-resolution CT chest next year for disease progression COVID booster recommended, she is up-to-date with flu shot and RSV

## 2022-04-18 NOTE — Patient Instructions (Signed)
  X LFTs  in dec   X Amb sat  Try miralax 17 g for constipation  Covid booster recommended

## 2022-05-26 ENCOUNTER — Telehealth: Payer: Self-pay | Admitting: *Deleted

## 2022-05-26 MED ORDER — AMOXICILLIN-POT CLAVULANATE 875-125 MG PO TABS: 1.0000 | ORAL_TABLET | Freq: Two times a day (BID) | ORAL | 0 refills | Status: AC

## 2022-05-26 NOTE — Telephone Encounter (Signed)
Debra Milch, MD  to Debra Jimenez D, CMA     05/26/22  4:57 PM  Augmentin 875 twice daily for 7 days. Okay to take Delsym cough syrup 5 by mouth twice daily as needed    Attempted to call pt but unable to reach. Left message for her to return call.

## 2022-05-26 NOTE — Telephone Encounter (Signed)
Primary Pulmonologist: Dr.Alva  Last office visit and with whom: Dr.Alva 04/18/2022 What do we see them for (pulmonary problems): IPF Last OV assessment/plan: see below   Was appointment offered to patient (explain)?  No availability in [facility] office    Reason for call:  Starting first of last week, Feels like she has a head cold. She states she has had a lot of congestion mainly in chest in the mornings but later in the day it is a dry hacking cough for 3 days. Denies increased SOB or wheezing. No fever.  Has taken tylenol cold and flu but has not helped. Would like recommendations on what to take. She states that a while ago she had an antibiotic sent in and it interfered with her Esbriet and made her sick. She wants to know what she can take to help get rid of her cough without interfering with the Esbriet. [name] please advise      Allergies  Allergen Reactions   Atorvastatin Other (See Comments)     Muscle aches. Has tried 2 different statins and unable to tolerate   Doxycycline Nausea And Vomiting    Intolerance due to perfenidone    Immunization History  Administered Date(s) Administered   Fluad Quad(high Dose 65+) 06/27/2018, 02/18/2022   Influenza, High Dose Seasonal PF 04/05/2017   Influenza,inj,quad, With Preservative 02/19/2018   Moderna Sars-Covid-2 Vaccination 07/11/2019, 08/16/2019   Pneumococcal Conjugate-13 05/25/2016   Respiratory Syncytial Virus Vaccine,Recomb Aduvanted(Arexvy) 02/18/2022   Tdap 01/13/2010, 03/12/2018    Assessment & Plan:           Assessment & Plan Note by Oretha Milch, MD at 04/18/2022 12:30 PM  Author: Oretha Milch, MD Author Type: Physician Filed: 04/18/2022 12:31 PM  Note Status: Carlisle Cater: Cosign Not Required Encounter Date: 04/18/2022  Problem: IPF (idiopathic pulmonary fibrosis) (HCC)  Editor: Oretha Milch, MD (Physician)      Prior Versions: 1. Oretha Milch, MD (Physician) at 04/18/2022 12:31 PM - Written  We once  again discussed side effect profile of pirfenidone.  We discussed using MiraLAX for constipation and sunscreen/protective clothing for photosensitivity.  Avoid doxycycline which may compound side effects. Feel that lung disease is stable Repeat LFTs in 3 months We will plan on repeating high-resolution CT chest next year for disease progression COVID booster recommended, she is up-to-date with flu shot and RSV        Patient Instructions by Oretha Milch, MD at 04/18/2022 11:45 AM  Author: Oretha Milch, MD Author Type: Physician Filed: 04/18/2022 12:14 PM  Note Status: Signed Cosign: Cosign Not Required Encounter Date: 04/18/2022  Editor: Oretha Milch, MD (Physician)               X LFTs  in dec    X Amb sat   Try miralax 17 g for constipation   Covid booster recommended

## 2022-05-26 NOTE — Telephone Encounter (Signed)
Called and spoke to patient. She voiced understanding, medication sent in.

## 2022-06-06 ENCOUNTER — Other Ambulatory Visit: Payer: Self-pay

## 2022-06-06 DIAGNOSIS — J84112 Idiopathic pulmonary fibrosis: Secondary | ICD-10-CM

## 2022-06-18 LAB — HEPATIC FUNCTION PANEL
ALT: 8 IU/L (ref 0–32)
AST: 15 IU/L (ref 0–40)
Albumin: 4.4 g/dL (ref 3.8–4.8)
Alkaline Phosphatase: 88 IU/L (ref 44–121)
Bilirubin Total: 0.5 mg/dL (ref 0.0–1.2)
Bilirubin, Direct: 0.13 mg/dL (ref 0.00–0.40)
Total Protein: 7 g/dL (ref 6.0–8.5)

## 2022-08-08 ENCOUNTER — Other Ambulatory Visit: Payer: Self-pay

## 2022-08-08 ENCOUNTER — Encounter: Payer: Self-pay | Admitting: Pulmonary Disease

## 2022-08-08 ENCOUNTER — Ambulatory Visit: Payer: Medicare Other | Admitting: Pulmonary Disease

## 2022-08-08 VITALS — BP 138/76 | HR 68 | Ht 65.5 in | Wt 122.2 lb

## 2022-08-08 DIAGNOSIS — J9611 Chronic respiratory failure with hypoxia: Secondary | ICD-10-CM

## 2022-08-08 DIAGNOSIS — J84112 Idiopathic pulmonary fibrosis: Secondary | ICD-10-CM

## 2022-08-08 MED ORDER — ONDANSETRON HCL 4 MG PO TABS: 4.0000 mg | ORAL_TABLET | Freq: Two times a day (BID) | ORAL | 1 refills | Status: DC | PRN

## 2022-08-08 NOTE — Progress Notes (Signed)
   Subjective:    Patient ID: Debra Jimenez, female    DOB: [birth year], 72 y.o.   MRN: [id number]  HPI  72 yo remote smoker for follow-up of ILD, symptom onset since 2019 -New onset hypertension on lisinopril   She had 10% drop in FVC from 20 19-20 21 >> progressive phenotype.   she has had disease progression on 100 mg of twice daily as demonstrated by HRCT and drop in lung function.  Unfortunately she has not tolerated full dose of Ofev 12/2021 Switched to [employer]  Patient presents with   Follow-up    Breathing doing well     Breathing is at baseline, she reports dyspnea with unusual exertion. She reports indigestion, takes her pirfenidone at 10 AM does not have much of a breakfast and then at 3 PM and at 10 PM after late supper.  She has to take Zofran occasionally. Weight has remained more or less stable dropped slightly to 122 pounds from 125  On ambulation today oxygen saturation dropped to 91% and heart rate increased from 68 to 82 on 3 laps  Significant tests/ events reviewed  HRCT 10/2021 >> Slightly worsened moderate pulmonary fibrosis , c/w UIP HRCT 10/2020 >> c/w UIP, increased compared to 2021, unchanged prominent LNs     HRCT 10/2019 "probable" UIP , basilar predominance, early honeycombing , mild mediastinal lymphadenopathy     PFTs 09/2021  FVC 1.11/35%, TLC 44%, DLCO 4.33/21%   PFTs 02/2021 -FVC 1.26/40%, TLC 3.45/66%, DLCO 7.83/38%   PFTs 12/2019 -FVC 1.47/46%, TLC 3.49/67%, DLCO 12.2/60%   PFTs 12/2017  -[facility] -moderate intraparenchymal restriction, ratio 88, FVC 56%/1.70, DLCO 8.7/44%, TLC 3.21/64%  Review of Systems neg for any significant sore throat, dysphagia, itching, sneezing, nasal congestion or excess/ purulent secretions, fever, chills, sweats, unintended wt loss, pleuritic or exertional cp, hempoptysis, orthopnea pnd or change in chronic leg swelling. Also denies presyncope, palpitations, heartburn, abdominal pain, nausea,  vomiting, diarrhea or change in bowel or urinary habits, dysuria,hematuria, rash, arthralgias, visual complaints, headache, numbness weakness or ataxia.     Objective:   Physical Exam  Gen. Pleasant, thin woman, in no distress ENT - no thrush, no pallor/icterus,no post nasal drip Neck: No JVD, no thyromegaly, no carotid bruits Lungs: no use of accessory muscles, no dullness to percussion, bibasal dry crackles Cardiovascular: Rhythm regular, heart sounds  normal, no murmurs or gallops, no peripheral edema Musculoskeletal: No deformities, no cyanosis or clubbing         Assessment & Plan:

## 2022-08-08 NOTE — Assessment & Plan Note (Signed)
I am encouraged to see that she desaturates slightly but does not need oxygen yet

## 2022-08-08 NOTE — Patient Instructions (Addendum)
  X refills on perfenidone  X HRCT chest in May 2024  X SPirometry _DLCO only xLFTs  X Amb sat

## 2022-08-08 NOTE — Assessment & Plan Note (Addendum)
LFTs will be obtained today. Refills provided on pirfenidone, we again discussed side effect profile and taking it after meals to minimize side effects. She does have some indigestion which could be attributable to this drug. We will obtain spirometry and DLCO, she does not like to do the PFT maneuver

## 2022-08-09 LAB — HEPATIC FUNCTION PANEL
ALT: 9 IU/L (ref 0–32)
AST: 14 IU/L (ref 0–40)
Albumin: 4.3 g/dL (ref 3.8–4.8)
Alkaline Phosphatase: 104 IU/L (ref 44–121)
Bilirubin Total: 0.4 mg/dL (ref 0.0–1.2)
Bilirubin, Direct: 0.11 mg/dL (ref 0.00–0.40)
Total Protein: 7.2 g/dL (ref 6.0–8.5)

## 2022-08-10 ENCOUNTER — Other Ambulatory Visit: Payer: Self-pay | Admitting: Pharmacist

## 2022-08-10 DIAGNOSIS — J84112 Idiopathic pulmonary fibrosis: Secondary | ICD-10-CM

## 2022-08-10 MED ORDER — PIRFENIDONE 801 MG PO TABS: 801.0000 mg | ORAL_TABLET | Freq: Three times a day (TID) | ORAL | 1 refills | Status: DC

## 2022-08-10 NOTE — Telephone Encounter (Signed)
Refill sent for ESBRIET to [facility] ([facility]) for Esbriet: [phone]  Dose: 801 mg three times daily  Last OV: 08/08/2022 Provider: Dr. Elsworth Soho  Next OV: not yet scheduled  LFTs on 08/08/2022 wnl  Knox Saliva, PharmD, MPH, BCPS Clinical Pharmacist (Rheumatology and Pulmonology)

## 2022-08-21 ENCOUNTER — Other Ambulatory Visit: Payer: Self-pay | Admitting: Pulmonary Disease

## 2022-08-23 DIAGNOSIS — Z1231 Encounter for screening mammogram for malignant neoplasm of breast: Secondary | ICD-10-CM | POA: Diagnosis not present

## 2022-10-24 ENCOUNTER — Ambulatory Visit (HOSPITAL_COMMUNITY)
Admission: RE | Admit: 2022-10-24 | Discharge: 2022-10-24 | Disposition: A | Discharge status: Home / Self Care | Payer: Medicare Other | Source: Ambulatory Visit | Attending: Pulmonary Disease | Admitting: Pulmonary Disease

## 2022-10-24 DIAGNOSIS — J84112 Idiopathic pulmonary fibrosis: Secondary | ICD-10-CM | POA: Diagnosis not present

## 2022-10-24 DIAGNOSIS — J479 Bronchiectasis, uncomplicated: Secondary | ICD-10-CM | POA: Diagnosis not present

## 2022-11-16 ENCOUNTER — Telehealth: Payer: Self-pay | Admitting: Pharmacist

## 2022-11-16 NOTE — Telephone Encounter (Signed)
Received fax from [place] requesting information for reverification of ongoing eligibility for Esbriet's patient assistance application. Left VM for pt  Debra Jimenez, PharmD, MPH, BCPS, CPP Clinical Pharmacist (Rheumatology and Pulmonology)

## 2022-11-17 NOTE — Telephone Encounter (Signed)
Patient returned call. Completed income and household size information. Faxed to [place] with insurance copy  Fax: [phone] Phone: [phone]  Debra Jimenez, PharmD, MPH, BCPS, CPP Clinical Pharmacist (Rheumatology and Pulmonology)

## 2022-11-22 ENCOUNTER — Ambulatory Visit (HOSPITAL_COMMUNITY)
Admission: RE | Admit: 2022-11-22 | Discharge: 2022-11-22 | Disposition: A | Discharge status: Home / Self Care | Payer: Medicare Other | Source: Ambulatory Visit | Attending: Pulmonary Disease | Admitting: Pulmonary Disease

## 2022-11-22 DIAGNOSIS — J84112 Idiopathic pulmonary fibrosis: Secondary | ICD-10-CM | POA: Diagnosis not present

## 2022-11-22 LAB — PULMONARY FUNCTION TEST
DL/VA % pred: 101 %
DL/VA: 4.2 ml/min/mmHg/L
DLCO unc % pred: 36 %
DLCO unc: 7.13 ml/min/mmHg
FEF 25-75 Pre: 1.6 L/sec
FEF2575-%Pred-Pre: 86 %
FEV1-%Pred-Pre: 45 %
FEV1-Pre: 1.05 L
FEV1FVC-%Pred-Pre: 117 %
FEV6-%Pred-Pre: 40 %
FEV6-Pre: 1.16 L
FEV6FVC-%Pred-Pre: 104 %
FVC-%Pred-Pre: 38 %
FVC-Pre: 1.18 L
Pre FEV1/FVC ratio: 89 %
Pre FEV6/FVC Ratio: 100 %

## 2022-11-24 DIAGNOSIS — E559 Vitamin D deficiency, unspecified: Secondary | ICD-10-CM | POA: Diagnosis not present

## 2022-11-24 DIAGNOSIS — J849 Interstitial pulmonary disease, unspecified: Secondary | ICD-10-CM | POA: Diagnosis not present

## 2022-11-24 DIAGNOSIS — G4700 Insomnia, unspecified: Secondary | ICD-10-CM | POA: Diagnosis not present

## 2022-11-24 DIAGNOSIS — M791 Myalgia, unspecified site: Secondary | ICD-10-CM | POA: Diagnosis not present

## 2022-11-24 DIAGNOSIS — Z1389 Encounter for screening for other disorder: Secondary | ICD-10-CM | POA: Diagnosis not present

## 2022-11-24 DIAGNOSIS — Z681 Body mass index (BMI) 19 or less, adult: Secondary | ICD-10-CM | POA: Diagnosis not present

## 2022-11-24 DIAGNOSIS — I10 Essential (primary) hypertension: Secondary | ICD-10-CM | POA: Diagnosis not present

## 2022-11-24 DIAGNOSIS — Z0001 Encounter for general adult medical examination with abnormal findings: Secondary | ICD-10-CM | POA: Diagnosis not present

## 2022-11-24 DIAGNOSIS — E039 Hypothyroidism, unspecified: Secondary | ICD-10-CM | POA: Diagnosis not present

## 2022-11-25 NOTE — Telephone Encounter (Signed)
Received fax from [place] that patient will continue to receive Esbriet through 06/20/2023 at no charge. However patient will no longer qualify for the patient assistance program as of 06/21/2023 due to not meeting financial criteria for program. Letter sent to media tab of patient's chart. Unfortunately we will have to wait until 06/21/2023 to run benefits for pirfenidone with new year.  MyChart message sent ot pt with update.  Chesley Mires, PharmD, MPH, BCPS, CPP Clinical Pharmacist (Rheumatology and Pulmonology)

## 2022-11-28 DIAGNOSIS — B029 Zoster without complications: Secondary | ICD-10-CM | POA: Diagnosis not present

## 2022-11-28 DIAGNOSIS — Z681 Body mass index (BMI) 19 or less, adult: Secondary | ICD-10-CM | POA: Diagnosis not present

## 2023-01-02 ENCOUNTER — Telehealth: Payer: Self-pay | Admitting: Pharmacist

## 2023-01-02 DIAGNOSIS — Z5181 Encounter for therapeutic drug level monitoring: Secondary | ICD-10-CM

## 2023-01-02 DIAGNOSIS — J84112 Idiopathic pulmonary fibrosis: Secondary | ICD-10-CM

## 2023-01-02 NOTE — Telephone Encounter (Signed)
Refill for Esbriet will be pending updated labs (hepatic function panel). My Chart message sent to pt to advise.  Chesley Mires, PharmD, MPH, BCPS, CPP Clinical Pharmacist (Rheumatology and Pulmonology)

## 2023-01-04 ENCOUNTER — Ambulatory Visit: Payer: Medicare Other | Admitting: Pulmonary Disease

## 2023-01-04 ENCOUNTER — Encounter: Payer: Self-pay | Admitting: Pulmonary Disease

## 2023-01-04 ENCOUNTER — Other Ambulatory Visit: Payer: Self-pay

## 2023-01-04 VITALS — BP 155/90 | HR 96 | Ht 65.5 in | Wt 118.0 lb

## 2023-01-04 DIAGNOSIS — F411 Generalized anxiety disorder: Secondary | ICD-10-CM | POA: Insufficient documentation

## 2023-01-04 DIAGNOSIS — J9611 Chronic respiratory failure with hypoxia: Secondary | ICD-10-CM

## 2023-01-04 DIAGNOSIS — J84112 Idiopathic pulmonary fibrosis: Secondary | ICD-10-CM

## 2023-01-04 DIAGNOSIS — Z5181 Encounter for therapeutic drug level monitoring: Secondary | ICD-10-CM | POA: Diagnosis not present

## 2023-01-04 DIAGNOSIS — F32A Depression, unspecified: Secondary | ICD-10-CM | POA: Insufficient documentation

## 2023-01-04 DIAGNOSIS — E7800 Pure hypercholesterolemia, unspecified: Secondary | ICD-10-CM | POA: Insufficient documentation

## 2023-01-04 DIAGNOSIS — M5030 Other cervical disc degeneration, unspecified cervical region: Secondary | ICD-10-CM | POA: Insufficient documentation

## 2023-01-04 MED ORDER — PIRFENIDONE 801 MG PO TABS
801.0000 mg | ORAL_TABLET | Freq: Three times a day (TID) | ORAL | 1 refills | Status: DC
Start: 2023-01-04 — End: 2023-07-14

## 2023-01-04 NOTE — Assessment & Plan Note (Addendum)
Disease appears to have stabilized both by imaging and by PFTs. We will check LFTs. We discussed role of food and nausea medications to be able to tolerate pirfenidone better, once again reviewed side effect profile

## 2023-01-04 NOTE — Assessment & Plan Note (Signed)
She does desaturate to 88% on walking today but would like to hold off on oxygen

## 2023-01-04 NOTE — Progress Notes (Signed)
   Subjective:    Patient ID: Debra Jimenez, female    DOB: August 28, 1950, 72 y.o.   MRN: [id number]  HPI  72 yo remote smoker for follow-up of ILD, symptom onset since 2019 -New onset hypertension on lisinopril   She had 10% drop in FVC from 20 19-20 21 >> progressive phenotype.   she had disease progression on 100 mg twice daily on HRCT and drop in lung function.  Unfortunately did not tolerate full dose of Ofev 12/2021 Switched to Esbriet  43-month follow-up visit Breathing is stable, gets worse in the heat.  She cannot stay outdoors for too long.  She was told that she will need LFTs rechecked before she gets refills on pirfenidone.  She reports nausea in the mornings, she is learning to take her Zofran She has been started on zetia for high cholesterol We reviewed LFTs high-resolution CT chest and PFTs   Significant tests/ events reviewed   HRCT  10/2022 >>  no progression HRCT 10/2021 >> Slightly worsened moderate pulmonary fibrosis , c/w UIP HRCT 10/2020 >> c/w UIP, increased compared to 2021, unchanged prominent LNs     HRCT 10/2019 "probable" UIP , basilar predominance, early honeycombing , mild mediastinal lymphadenopathy    PFTs 11/2022 >> FVC 38%, DLCO 7.1/36% PFTs 09/2021  FVC 1.11/35%, TLC 44%, DLCO 4.33/21%   PFTs 02/2021 -FVC 1.26/40%, TLC 3.45/66%, DLCO 7.83/38%   PFTs 12/2019 -FVC 1.47/46%, TLC 3.49/67%, DLCO 12.2/60%   PFTs 12/2017  -[facility] -moderate intraparenchymal restriction, ratio 88, FVC 56%/1.70, DLCO 8.7/44%, TLC 3.21/64%  Review of Systems neg for any significant sore throat, dysphagia, itching, sneezing, nasal congestion or excess/ purulent secretions, fever, chills, sweats, unintended wt loss, pleuritic or exertional cp, hempoptysis, orthopnea pnd or change in chronic leg swelling. Also denies presyncope, palpitations, heartburn, abdominal pain, nausea, vomiting, diarrhea or change in bowel or urinary habits, dysuria,hematuria, rash, arthralgias, visual  complaints, headache, numbness weakness or ataxia.     Objective:   Physical Exam  Gen. Pleasant, well-nourished, in no distress ENT - no thrush, no pallor/icterus,no post nasal drip Neck: No JVD, no thyromegaly, no carotid bruits Lungs: no use of accessory muscles, no dullness to percussion, bibasal dry crackles Cardiovascular: Rhythm regular, heart sounds  normal, no murmurs or gallops, no peripheral edema Musculoskeletal: No deformities, no cyanosis or clubbing        Assessment & Plan:

## 2023-01-04 NOTE — Patient Instructions (Addendum)
x LFTs today & q 3months -next in October  X refills on perfenidone  Lung function & CT is stable

## 2023-01-05 LAB — HEPATIC FUNCTION PANEL
ALT: 8 IU/L (ref 0–32)
AST: 18 IU/L (ref 0–40)
Albumin: 4.4 g/dL (ref 3.8–4.8)
Alkaline Phosphatase: 109 IU/L (ref 44–121)
Bilirubin Total: 0.5 mg/dL (ref 0.0–1.2)
Bilirubin, Direct: 0.12 mg/dL (ref 0.00–0.40)
Total Protein: 7.1 g/dL (ref 6.0–8.5)

## 2023-01-06 ENCOUNTER — Telehealth: Payer: Self-pay

## 2023-01-06 ENCOUNTER — Other Ambulatory Visit: Payer: Self-pay | Admitting: Pharmacist

## 2023-01-06 DIAGNOSIS — E039 Hypothyroidism, unspecified: Secondary | ICD-10-CM | POA: Diagnosis not present

## 2023-01-06 MED ORDER — ONDANSETRON HCL 4 MG PO TABS: 4.0000 mg | ORAL_TABLET | Freq: Two times a day (BID) | ORAL | 1 refills | Status: DC | PRN

## 2023-01-06 NOTE — Telephone Encounter (Signed)
Rx sent to pharmacy   

## 2023-01-06 NOTE — Telephone Encounter (Signed)
LFTs wnl on 01/04/23. Pirfenidone refill sent by CMA on 01/04/23 to [facility]  Chesley Mires, PharmD, MPH, BCPS, CPP Clinical Pharmacist (Rheumatology and Pulmonology)

## 2023-01-13 DIAGNOSIS — E039 Hypothyroidism, unspecified: Secondary | ICD-10-CM | POA: Diagnosis not present

## 2023-01-13 DIAGNOSIS — I10 Essential (primary) hypertension: Secondary | ICD-10-CM | POA: Diagnosis not present

## 2023-01-13 DIAGNOSIS — M791 Myalgia, unspecified site: Secondary | ICD-10-CM | POA: Diagnosis not present

## 2023-01-13 DIAGNOSIS — J849 Interstitial pulmonary disease, unspecified: Secondary | ICD-10-CM | POA: Diagnosis not present

## 2023-01-13 DIAGNOSIS — G4700 Insomnia, unspecified: Secondary | ICD-10-CM | POA: Diagnosis not present

## 2023-01-13 DIAGNOSIS — Z681 Body mass index (BMI) 19 or less, adult: Secondary | ICD-10-CM | POA: Diagnosis not present

## 2023-01-24 DIAGNOSIS — I10 Essential (primary) hypertension: Secondary | ICD-10-CM | POA: Diagnosis not present

## 2023-02-14 DIAGNOSIS — E039 Hypothyroidism, unspecified: Secondary | ICD-10-CM | POA: Diagnosis not present

## 2023-05-09 DIAGNOSIS — E039 Hypothyroidism, unspecified: Secondary | ICD-10-CM | POA: Diagnosis not present

## 2023-05-09 DIAGNOSIS — I10 Essential (primary) hypertension: Secondary | ICD-10-CM | POA: Diagnosis not present

## 2023-05-09 DIAGNOSIS — M791 Myalgia, unspecified site: Secondary | ICD-10-CM | POA: Diagnosis not present

## 2023-05-09 DIAGNOSIS — Z6820 Body mass index (BMI) 20.0-20.9, adult: Secondary | ICD-10-CM | POA: Diagnosis not present

## 2023-05-09 DIAGNOSIS — J849 Interstitial pulmonary disease, unspecified: Secondary | ICD-10-CM | POA: Diagnosis not present

## 2023-05-09 DIAGNOSIS — Z23 Encounter for immunization: Secondary | ICD-10-CM | POA: Diagnosis not present

## 2023-05-09 DIAGNOSIS — G4700 Insomnia, unspecified: Secondary | ICD-10-CM | POA: Diagnosis not present

## 2023-05-17 ENCOUNTER — Ambulatory Visit: Payer: Medicare Other | Admitting: Adult Health

## 2023-05-17 ENCOUNTER — Encounter: Payer: Self-pay | Admitting: Adult Health

## 2023-05-17 ENCOUNTER — Other Ambulatory Visit: Payer: Self-pay

## 2023-05-17 VITALS — BP 124/84 | HR 105 | Ht 65.5 in | Wt 115.0 lb

## 2023-05-17 DIAGNOSIS — J84112 Idiopathic pulmonary fibrosis: Secondary | ICD-10-CM

## 2023-05-17 DIAGNOSIS — E43 Unspecified severe protein-calorie malnutrition: Secondary | ICD-10-CM

## 2023-05-17 DIAGNOSIS — E46 Unspecified protein-calorie malnutrition: Secondary | ICD-10-CM | POA: Insufficient documentation

## 2023-05-17 DIAGNOSIS — Z5181 Encounter for therapeutic drug level monitoring: Secondary | ICD-10-CM

## 2023-05-17 NOTE — Assessment & Plan Note (Signed)
IPF with probable UIP on high-resolution CT chest.  Patient is doing fairly well on Esbriet however does have some associated nausea and slow weight loss.  Have encouraged her on high-protein high-calorie diet.  Several helpful hints and taking Esbriet and timing during the daytime.  May use Zofran as needed for nausea.  High-resolution CT chest May 2024 showed no progression with stable IPF changes.  PFTs showed stable changes in December 08, 2022.  Check LFTs today. Will continue on current regimen.  Activity as tolerated.  Plan  Patient Instructions  Continue on Esbriet Three times a day.  Labs today  High protein /calorie diet  Activity as tolerated.  Albuterol inhaler As needed   Follow up in 3-4 months with [name]  and As needed

## 2023-05-17 NOTE — Progress Notes (Signed)
@Patient  ID: Debra Jimenez, female    DOB: 1951-04-13, 72 y.o.   MRN: [id number]  Chief Complaint  Patient presents with   Follow-up    Referring provider: Royann Shivers, *  HPI: 72 year old female former smoker followed for interstitial lung disease -IPF with probable UIP on CT scan ILD :  Dx ~2021 , Started on OFEV, unable to tolerate changed to Esbriet 12/2021   TEST/EVENTS :  HRCT  10/2022 >>  no progression HRCT 10/2021 >> Slightly worsened moderate pulmonary fibrosis , c/w UIP HRCT 10/2020 >> c/w UIP, increased compared to 2021, unchanged prominent LNs     HRCT 10/2019 "probable" UIP , basilar predominance, early honeycombing , mild mediastinal lymphadenopathy     PFTs 11/2022 >> FVC 38%, DLCO 7.1/36% PFTs 09/2021  FVC 1.11/35%, TLC 44%, DLCO 4.33/21%   PFTs 02/2021 -FVC 1.26/40%, TLC 3.45/66%, DLCO 7.83/38%   PFTs 12/2019 -FVC 1.47/46%, TLC 3.49/67%, DLCO 12.2/60%   PFTs 12/2017  -[facility] -moderate intraparenchymal restriction, ratio 88, FVC 56%/1.70, DLCO 8.7/44%, TLC 3.21/64%  05/17/2023 Follow up : ILD  Patient returns for a 101-month follow-up.  Patient is followed for IPF with probable UIP on CT scan.  Initially diagnosed around 2021.  She was started on Ofev initially and then transition to Esbriet due to GI intolerance.  Since last visit patient says overall she is doing well.  She does have some GI discomfort with mainly nausea for the first few hours after taking Esbriet.  Weight has slowly trended down over the last several years.  She is down another 10 pounds over this past year.  Current weight is at 115 pounds with a BMI at 18.  She says she is trying to eat higher protein but does not have much appetite.  She does do some protein shakes.  She denies any flare of cough.  Activity tolerance is at baseline.  She is very active but takes frequent rest breaks.  She is fully independent at home.  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   She drives locally.  Does all her housework.  Does yard work.  She is accompanied today by her daughter.  She denies any fever, hemoptysis, chest pain.   Allergies  Allergen Reactions   Atorvastatin Other (See Comments)     Muscle aches. Has tried 2 different statins and unable to tolerate   Doxycycline Nausea And Vomiting    Intolerance due to perfenidone    Immunization History  Administered Date(s) Administered   Fluad Quad(high Dose 65+) 06/27/2018, 02/18/2022   Influenza, High Dose Seasonal PF 04/05/2017   Influenza,inj,quad, With Preservative 02/19/2018   Moderna Sars-Covid-2 Vaccination 07/11/2019, 08/16/2019   Pneumococcal Conjugate-13 05/25/2016   Respiratory Syncytial Virus Vaccine,Recomb Aduvanted(Arexvy) 02/18/2022   Tdap 01/13/2010, 03/12/2018    Past Medical History:  Diagnosis Date   Hypothyroidism    OA (osteoarthritis)    RIGHT HIP AND LEFT HIP   Wears glasses     Tobacco History: Social History   Tobacco Use  Smoking Status Former   Current packs/day: 0.00   Average packs/day: 0.5 packs/day for 15.0 years (7.5 ttl pk-yrs)   Types: Cigarettes   Start date: 73   Quit date: 36   Years since quitting: 39.9  Smokeless Tobacco Never   Counseling given: Not Answered   Outpatient Medications Prior to Visit  Medication Sig Dispense Refill   albuterol (VENTOLIN HFA) 108 (90 Base) MCG/ACT inhaler Inhale into the lungs every  6 (six) hours as needed for wheezing or shortness of breath.     ALPRAZolam (XANAX) 0.25 MG tablet Take 0.25 mg by mouth daily as needed.     ezetimibe (ZETIA) 10 MG tablet Take 10 mg by mouth daily.     levothyroxine (SYNTHROID) 125 MCG tablet Take 125 mcg by mouth every morning.     losartan (COZAAR) 50 MG tablet Take 50 mg by mouth daily.     ondansetron (ZOFRAN) 4 MG tablet Take 1 tablet (4 mg total) by mouth 2 (two) times daily as needed for nausea or vomiting. 30 tablet 1   Pirfenidone (ESBRIET) 801 MG TABS Take 1 tablet (801  mg total) by mouth 3 (three) times daily with meals. 270 tablet 1   valACYclovir (VALTREX) 1000 MG tablet Take 1,000 mg by mouth 3 (three) times daily.     zolpidem (AMBIEN) 5 MG tablet Take 5 mg by mouth at bedtime as needed.     albuterol (PROAIR HFA) 108 (90 Base) MCG/ACT inhaler Inhale 2 puffs into the lungs every 6 (six) hours as needed for wheezing or shortness of breath. 6.7 g 3   ibuprofen (ADVIL) 800 MG tablet Take 800 mg by mouth 3 (three) times daily.     levothyroxine (SYNTHROID) 112 MCG tablet Take 112 mcg by mouth every morning.     No facility-administered medications prior to visit.     Review of Systems:   Constitutional:   No  weight loss, night sweats,  Fevers, chills, +fatigue, or  lassitude.  HEENT:   No headaches,  Difficulty swallowing,  Tooth/dental problems, or  Sore throat,                No sneezing, itching, ear ache, nasal congestion, post nasal drip,   CV:  No chest pain,  Orthopnea, PND, swelling in lower extremities, anasarca, dizziness, palpitations, syncope.   GI  No heartburn, indigestion, abdominal pain, nausea, vomiting, diarrhea, change in bowel habits, loss of appetite, bloody stools.   Resp: .  No chest wall deformity  Skin: no rash or lesions.  GU: no dysuria, change in color of urine, no urgency or frequency.  No flank pain, no hematuria   MS:  No joint pain or swelling.  No decreased range of motion.  No back pain.    Physical Exam  BP 124/84   Pulse (!) 105   Ht 5' 5.5" (1.664 m)   Wt 115 lb (52.2 kg)   SpO2 94%   BMI 18.85 kg/m   GEN: A/Ox3; pleasant , NAD, thin    HEENT:  [place]/AT,  EACs-clear, TMs-wnl, NOSE-clear, THROAT-clear, no lesions, no postnasal drip or exudate noted.   NECK:  Supple w/ fair ROM; no JVD; normal carotid impulses w/o bruits; no thyromegaly or nodules palpated; no lymphadenopathy.    RESP bibasilar crackles  no accessory muscle use, no dullness to percussion  CARD:  RRR, no m/r/g, no peripheral edema,  pulses intact, no cyanosis or clubbing.  GI:   Soft & nt; nml bowel sounds; no organomegaly or masses detected.   Musco: Warm bil, no deformities or joint swelling noted.   Neuro: alert, no focal deficits noted.    Skin: Warm, no lesions or rashes    Lab Results:  CBC   BNP No results found for: "BNP"  ProBNP No results found for: "PROBNP"  Imaging: No results found.  Administration History     None          Latest  Ref Rng & Units 11/22/2022    1:52 PM 10/07/2021    2:02 PM 02/19/2021   11:00 AM 01/13/2020    3:57 PM  PFT Results  FVC-Pre L 1.18  1.06  1.26  1.47   FVC-Predicted Pre % 38  34  40  46   FVC-Post L  1.11  1.21  1.51   FVC-Predicted Post %  35  38  47   Pre FEV1/FVC % % 89  88  89  81   Post FEV1/FCV % %  94  92  78   FEV1-Pre L 1.05  0.93  1.13  1.19   FEV1-Predicted Pre % 45  39  47  49   FEV1-Post L  1.05  1.11  1.17   DLCO uncorrected ml/min/mmHg 7.13  4.33  7.83  12.22   DLCO UNC% % 36  21  38  60   DLCO corrected ml/min/mmHg    12.22   DLCO COR %Predicted %    60   DLVA Predicted % 101  68  116  112   TLC L  2.24  3.45  3.49   TLC % Predicted %  43  66  67   RV % Predicted %  50  102  90     No results found for: "NITRICOXIDE"      Assessment & Plan:   IPF (idiopathic pulmonary fibrosis) (HCC) IPF with probable UIP on high-resolution CT chest.  Patient is doing fairly well on Esbriet however does have some associated nausea and slow weight loss.  Have encouraged her on high-protein high-calorie diet.  Several helpful hints and taking Esbriet and timing during the daytime.  May use Zofran as needed for nausea.  High-resolution CT chest May 2024 showed no progression with stable IPF changes.  PFTs showed stable changes in December 08, 2022.  Check LFTs today. Will continue on current regimen.  Activity as tolerated.  Plan  Patient Instructions  Continue on Esbriet Three times a day.  Labs today  High protein /calorie diet  Activity as  tolerated.  Albuterol inhaler As needed   Follow up in 3-4 months with [name]  and As needed       Protein calorie malnutrition (HCC) Encouraged on high-protein high-calorie diet     Rubye Oaks, NP 05/17/2023

## 2023-05-17 NOTE — Patient Instructions (Addendum)
Continue on Esbriet Three times a day.  Labs today  High protein /calorie diet  Activity as tolerated.  Albuterol inhaler As needed   Follow up in 3-4 months with [name]  and As needed

## 2023-05-17 NOTE — Assessment & Plan Note (Signed)
Encouraged on high-protein high-calorie diet

## 2023-05-18 LAB — HEPATIC FUNCTION PANEL
ALT: 7 [IU]/L (ref 0–32)
AST: 18 [IU]/L (ref 0–40)
Albumin: 4.4 g/dL (ref 3.8–4.8)
Alkaline Phosphatase: 95 [IU]/L (ref 44–121)
Bilirubin Total: 0.4 mg/dL (ref 0.0–1.2)
Bilirubin, Direct: 0.15 mg/dL (ref 0.00–0.40)
Total Protein: 7.3 g/dL (ref 6.0–8.5)

## 2023-06-28 ENCOUNTER — Other Ambulatory Visit: Payer: Self-pay | Admitting: Pulmonary Disease

## 2023-07-04 ENCOUNTER — Telehealth: Payer: Self-pay | Admitting: Pharmacist

## 2023-07-04 DIAGNOSIS — J84112 Idiopathic pulmonary fibrosis: Secondary | ICD-10-CM

## 2023-07-04 NOTE — Telephone Encounter (Signed)
 Patient enrolled into PF grant through PA[employer] Award Period: 01/05/2023 - 07/03/2024 Cardholder: [id number] BIN: [id number] PCN: [id number] Group: [id number] For pharmacy inquiries, contact PDMI at [phone].  Please run pirfenidone  BIV (No longer eligible for [employer] PAP)

## 2023-07-08 NOTE — Telephone Encounter (Signed)
Submitted a Prior Authorization request to [facility] for PIRFENIDONE via CoverMyMeds. Will update once we receive a response.  Key: [id number]  Per automated response: This medication or product was previously approved on [id number] from 2023-06-21 to 2024-06-19. Providers contact us at [phone] for further assistance.

## 2023-07-14 ENCOUNTER — Other Ambulatory Visit: Payer: Self-pay | Admitting: Pharmacist

## 2023-07-14 ENCOUNTER — Other Ambulatory Visit (HOSPITAL_COMMUNITY): Payer: Self-pay

## 2023-07-14 MED ORDER — PIRFENIDONE 801 MG PO TABS
801.0000 mg | ORAL_TABLET | Freq: Three times a day (TID) | ORAL | 1 refills | Status: DC
  Filled 2023-07-17: qty 90, 30d supply, fill #0
  Filled 2023-08-04: qty 90, 30d supply, fill #1
  Filled 2023-08-29: qty 90, 30d supply, fill #2
  Filled 2023-10-31 – 2023-12-07 (×2): qty 90, 30d supply, fill #3
  Filled 2023-12-29 – 2024-03-15 (×3): qty 90, 30d supply, fill #4
  Filled 2024-04-19: qty 90, 30d supply, fill #5

## 2023-07-14 NOTE — Telephone Encounter (Addendum)
Test claim revealed that insurance covers $106.38, leaving patient with a copay of $461.37. Per plan limitations, Rx not to exceed 30 days supply.    Kennedy Bucker info has been added to [employer].

## 2023-07-14 NOTE — Progress Notes (Signed)
Patient no longer eligible for PAP for Esbriet thorugh [employer]. Enrolled into PF grant  Will continue pirfenidone 801mg  three times daily  Chesley Mires, PharmD, MPH, BCPS, CPP Clinical Pharmacist (Rheumatology and Pulmonology)

## 2023-07-14 NOTE — Telephone Encounter (Signed)
Rx for pirfenidone sent to [facility]

## 2023-07-17 ENCOUNTER — Other Ambulatory Visit: Payer: Self-pay

## 2023-07-17 ENCOUNTER — Other Ambulatory Visit (HOSPITAL_COMMUNITY): Payer: Self-pay

## 2023-07-17 NOTE — Progress Notes (Signed)
Specialty Pharmacy Initial Fill Coordination Note  Debra Jimenez is a 73 y.o. female contacted today regarding initial fill of specialty medication(s) Pirfenidone   Patient requested Delivery   Delivery date: 07/19/23   Verified address: [address]   [place] [address]   Medication will be filled on 07/18/23.   Patient has grant on file and is aware of $0 copayment.

## 2023-07-18 ENCOUNTER — Other Ambulatory Visit (HOSPITAL_COMMUNITY): Payer: Self-pay

## 2023-07-18 ENCOUNTER — Other Ambulatory Visit: Payer: Self-pay

## 2023-07-18 ENCOUNTER — Other Ambulatory Visit: Payer: Self-pay | Admitting: Pulmonary Disease

## 2023-07-18 NOTE — Telephone Encounter (Signed)
Pt is requesting Ondansetron 4mg . Pt was last seen with TP 05/17/23. Medication is not listed on last ov note with [name] or TP. Please advise [name].

## 2023-08-03 ENCOUNTER — Other Ambulatory Visit (HOSPITAL_COMMUNITY): Payer: Self-pay

## 2023-08-04 ENCOUNTER — Other Ambulatory Visit: Payer: Self-pay

## 2023-08-04 NOTE — Progress Notes (Signed)
Specialty Pharmacy Refill Coordination Note  Debra Jimenez is a 73 y.o. female contacted today regarding refills of specialty medication(s) Pirfenidone   Patient requested Delivery   Delivery date: 08/11/23   Verified address: [address]   [place] [address]   Medication will be filled on 08/10/23.

## 2023-08-10 ENCOUNTER — Other Ambulatory Visit: Payer: Self-pay

## 2023-08-28 DIAGNOSIS — E039 Hypothyroidism, unspecified: Secondary | ICD-10-CM | POA: Diagnosis not present

## 2023-08-29 ENCOUNTER — Other Ambulatory Visit: Payer: Self-pay

## 2023-08-29 NOTE — Progress Notes (Signed)
 Specialty Pharmacy Refill Coordination Note  Debra Jimenez is a 73 y.o. female contacted today regarding refills of specialty medication(s) Pirfenidone   Patient requested (Patient-Rptd) Delivery   Delivery date:  (selected delivery date is a sunday. updated delivery date 4.18. patient will be notified via mychart)   Verified address: (Patient-Rptd) [address]  [place] [address]   Medication will be filled on 04.17.25.

## 2023-09-07 ENCOUNTER — Other Ambulatory Visit: Payer: Self-pay

## 2023-09-07 ENCOUNTER — Ambulatory Visit: Payer: Medicare Other | Admitting: Nurse Practitioner

## 2023-09-07 ENCOUNTER — Encounter: Payer: Self-pay | Admitting: Nurse Practitioner

## 2023-09-07 VITALS — BP 136/78 | HR 76 | Ht 65.5 in | Wt 113.4 lb

## 2023-09-07 DIAGNOSIS — Z5181 Encounter for therapeutic drug level monitoring: Secondary | ICD-10-CM | POA: Diagnosis not present

## 2023-09-07 DIAGNOSIS — Z87891 Personal history of nicotine dependence: Secondary | ICD-10-CM

## 2023-09-07 DIAGNOSIS — J84112 Idiopathic pulmonary fibrosis: Secondary | ICD-10-CM

## 2023-09-07 DIAGNOSIS — E43 Unspecified severe protein-calorie malnutrition: Secondary | ICD-10-CM | POA: Diagnosis not present

## 2023-09-07 DIAGNOSIS — E039 Hypothyroidism, unspecified: Secondary | ICD-10-CM

## 2023-09-07 MED ORDER — DRONABINOL 2.5 MG PO CAPS
2.5000 mg | ORAL_CAPSULE | Freq: Two times a day (BID) | ORAL | 0 refills | Status: DC
Start: 2023-09-07 — End: 2023-11-02

## 2023-09-07 NOTE — Patient Instructions (Addendum)
 Continue Albuterol inhaler 2 puffs every 6 hours as needed for shortness of breath or wheezing. Notify if symptoms persist despite rescue inhaler/neb use.  Continue Esbriet Three times a day with food. Wear sunscreen when outside  Try the zofran 30 minutes before the Esbriet  Repeat lung function testing and CT scan mid to end of May   Start Marinol 2.5 mg Twice daily for appetite. Take before breakfast and dinner. If this makes you drowsy, you can take once a day to start then increase from there. Do not drive until you know how this medication will impact your alertness. If this causes any worsening nausea, stop and let me know  If we can't get a medication covered for appetite, you could try CBD gummies and see if this helps at all with the nausea/appetite   Labs today   Follow up in 4 weeks with Debra Harlie Ragle,NP via virtual visit to see how new medication is working. If you end up not starting this, you can cancel this appt and just see me 6/12 in [place]. If symptoms do not improve or worsen, please contact office for sooner follow up or seek emergency care.

## 2023-09-07 NOTE — Progress Notes (Unsigned)
 @Patient  ID: Debra Jimenez, female    DOB: 03-May-1951, 73 y.o.   MRN: [id number]  Chief Complaint  Patient presents with   Follow-up    Referring provider: Royann Shivers, *  HPI: 73 year old female, former smoker (15 pack years) followed for ILD.  She is a patient Dr. Reginia Naas and last seen in office 08/09/2021 by [facility] NP.  Past medical history significant for hypertension and anxiety.  TEST/EVENTS:  12/2017 PFTs: Moderate intraparenchymal restriction.  Ratio 88.  FVC 1.7 (56), TLC 3.21 (64), DLCO 8.7 (44) 10/2019 HRCT: Probable UIP, basilar predominance, early honeycombing, mild mediastinal lymphadenopathy 12/2019 PFTs: FVC 1.47 (46), TLC 3.49 (67), DLCO 60% 10/2020 HRCT: Consistent with UIP, increased in comparison to 2021 study.  Unchanged prominent LNs. 02/2021 PFTs: FVC 1.26 (40%), TLC 3.45 (66%), DLCO 38% 08/10/2021 CTA chest: No evidence of PE.  Atherosclerosis.  Redemonstrated multiple mildly enlarged mediastinal and bilateral hilar lymph nodes.  Slight increase in AP window node to 1.4 cm from 1.2 cm.  Moderate changes of pulmonary fibrosis throughout both lungs, overall similar in appearance to previous.  Patchy areas of increased groundglass attenuation most pronounced within the upper lobes bilaterally, concerning for superimposed infectious or inflammatory process.  07/07/2021: OV with [name] four month f/u. PFTs 2022 with 10% decrease in FVC, decrease in DLCO from 2021 to 2022, stable TLC - progressive phenotype. Significant nausea with Ofev - previously decreased to 100 mg Twice daily. Viral infection around thanksgiving - provided with z pack which gave her stomach problems. Enrolled in pulm rehab previously but had to disengage over past 2 months. Breathing stable.    07/07/2021: OV with [name]. Continued Ofev at 100 mg Twice daily; did not believe she would tolerate higher doses. LFTs stable. Advised re-enrollment in pulm rehab. Repeat PFTs April 2023 and HRCT May 2023.     08/09/2021: OV with Beatrice Sehgal NP.  Worsening shortness of breath as well as some episodes of feeling weak and panting.  Felt she had more chest congestion and postnasal drip, which was slowly improving.  Felt like her breathing had slightly improved but then began having some chest tightness with deep breathing and a very rare dry cough.  Felt like albuterol did not help as much.  Reported that her oxygen has gotten low of 72% on room air at home.  Walking oximetry in office with SPO2 low 89% on room air, recovered to 90s without oxygen therapy.  CXR showed chronic changes.  COVID symptoms a D-dimer was obtained which was borderline positive.  CTA was ordered which is negative for PE; concern for possible pneumonia.  No mention of significant progression of fibrosis.  Treated with Augmentin course and prednisone.  Potassium noted to be low on B[place].  Was placed on 20 mEq daily and advised to follow-up with PCP.  Close follow-up.  Plans for repeat PFTs in April 2023 and HRCT in May 2023  09/03/2021: Today-follow-up Patient presents today with husband for follow-up.  She reports feeling better and has not had any more episodes of particular lightheadedness.  She still continues to have some shortness of breath with exertion that she feels has been progressive over time.  Has not noticed any decreased oxygen levels at home since our last visit. Cough has mostly resolved. Denies orthopnea, PND, lower extremity swelling or hemoptysis. Rare use of albuterol. Continues on Ofev without difficulties. Does report some recent constipation which she began taking miralax for yesterday.   09/07/2023: Today -  follow up Discussed the use of AI scribe software for clinical note transcription with the patient, who gave verbal consent to proceed.  History of Present Illness   Debra Jimenez is a 73 year old female with pulmonary fibrosis who presents for follow up. She is accompanied by her husband.  She experiences shortness  of breath primarily during outdoor activities, while her breathing remains stable indoors, allowing her to perform most household chores. Minimal coughing is present, and there is no significant worsening of shortness of breath.   She is currently taking Esbriet for her pulmonary fibrosis, which causes nausea. She manages this nausea with Zofran, typically once or twice a day. The nausea is worse in the morning after breakfast, particularly when consuming dairy products. She takes Esbriet three times a day and has been trying to manage the nausea by timing her Zofran intake.  She has a history of hypothyroidism and recently had her Synthroid dose increased from 125 mcg to 137 mcg due to symptoms of cold extremities, fatigue, and constipation. Her TSH level was noted to be 10.1, indicating hypothyroidism. She has been on the increased dose for about two weeks and feels slightly better. Constipation is managed with MiraLAX and occasionally Colace.  She reports a decreased appetite and has lost weight, dropping from 116 pounds to 113 pounds. She sometimes forces herself to eat and supplements her diet with Boost or Ensure. She wants to improve her appetite.       Allergies  Allergen Reactions   Atorvastatin Other (See Comments)     Muscle aches. Has tried 2 different statins and unable to tolerate   Doxycycline Nausea And Vomiting    Intolerance due to perfenidone    Immunization History  Administered Date(s) Administered   Fluad Quad(high Dose 65+) 06/27/2018, 02/18/2022   Influenza, High Dose Seasonal PF 04/05/2017   Influenza,inj,quad, With Preservative 02/19/2018   Moderna Sars-Covid-2 Vaccination 07/11/2019, 08/16/2019   Pneumococcal Conjugate-13 05/25/2016   Respiratory Syncytial Virus Vaccine,Recomb Aduvanted(Arexvy) 02/18/2022   Tdap 01/13/2010, 03/12/2018    Past Medical History:  Diagnosis Date   Hypothyroidism    OA (osteoarthritis)    RIGHT HIP AND LEFT HIP   Wears glasses      Tobacco History: Social History   Tobacco Use  Smoking Status Former   Current packs/day: 0.00   Average packs/day: 0.5 packs/day for 15.0 years (7.5 ttl pk-yrs)   Types: Cigarettes   Start date: 20   Quit date: 1   Years since quitting: 40.2  Smokeless Tobacco Never   Counseling given: Not Answered   Outpatient Medications Prior to Visit  Medication Sig Dispense Refill   albuterol (VENTOLIN HFA) 108 (90 Base) MCG/ACT inhaler Inhale into the lungs every 6 (six) hours as needed for wheezing or shortness of breath.     ALPRAZolam (XANAX) 0.25 MG tablet Take 0.25 mg by mouth daily as needed.     ezetimibe (ZETIA) 10 MG tablet Take 10 mg by mouth daily.     levothyroxine (SYNTHROID) 125 MCG tablet Take 125 mcg by mouth every morning.     losartan (COZAAR) 50 MG tablet Take 50 mg by mouth daily.     ondansetron (ZOFRAN) 4 MG tablet TAKE 1 TABLET BY MOUTH TWICE DAILY AS NEEDED FOR NAUSEA AND VOMITING 30 tablet 1   Pirfenidone (ESBRIET) 801 MG TABS Take 1 tablet (801 mg total) by mouth 3 (three) times daily with meals. 270 tablet 1   zolpidem (AMBIEN) 5 MG tablet Take 5  mg by mouth at bedtime as needed.     valACYclovir (VALTREX) 1000 MG tablet Take 1,000 mg by mouth 3 (three) times daily.     No facility-administered medications prior to visit.     Review of Systems:   Constitutional: No weight loss or gain, night sweats, fevers, chills, fatigue, or lassitude. HEENT: No headaches, difficulty swallowing, tooth/dental problems, or sore throat. No sneezing, itching, ear ache, nasal congestion, or post nasal drip CV:  No chest pain, orthopnea, PND, swelling in lower extremities, anasarca, dizziness, palpitations, syncope Resp: +shortness of breath with exertion (improved but overall progressive decline). No excess mucus or change in color of mucus. No productive or non-productive. No hemoptysis. No wheezing.  No chest wall deformity GI:  +constipation. No heartburn,  indigestion, abdominal pain, nausea, vomiting, diarrhea, loss of appetite, bloody stools.  GU: No dysuria, change in color of urine, urgency or frequency.  No flank pain, no hematuria  Skin: No rash, lesions, ulcerations MSK:  No joint pain or swelling.  No decreased range of motion.  No back pain. Neuro: No dizziness or lightheadedness.  Psych: No depression or anxiety. Mood stable.     Physical Exam:  There were no vitals taken for this visit.  GEN: Pleasant, interactive, well-appearing; in no acute distress. HEENT:  Normocephalic and atraumatic. PERRLA. Sclera white. Nasal turbinates pink, moist and patent bilaterally. No rhinorrhea present. Oropharynx pink and moist, without exudate or edema. No lesions, ulcerations, or postnasal drip.  NECK:  Supple w/ fair ROM. No JVD present. Normal carotid impulses w/o bruits. Thyroid symmetrical with no goiter or nodules palpated. No lymphadenopathy.   CV: RRR, no m/r/g, no peripheral edema. Pulses intact, +2 bilaterally. No cyanosis, pallor or clubbing. PULMONARY:  Unlabored, regular breathing. Clear bilaterally A&P w/o wheezes/rales/rhonchi. No accessory muscle use. No dullness to percussion. GI: BS present and normoactive. Soft, non-tender to palpation. No organomegaly or masses detected. No CVA tenderness. MSK: No erythema, warmth or tenderness. Cap refil <2 sec all extrem. No deformities or joint swelling noted.  Neuro: A/Ox3. No focal deficits noted.   Skin: Warm, no lesions or rashe Psych: Normal affect and behavior. Judgement and thought content appropriate.     Lab Results:  CBC    Component Value Date/Time   WBC 5.7 02/17/2020 1205   RBC 4.23 02/17/2020 1205   HGB 12.9 02/17/2020 1205   HCT 39.7 02/17/2020 1205   PLT 227 02/17/2020 1205   MCV 93.9 02/17/2020 1205   MCH 30.5 02/17/2020 1205   MCHC 32.5 02/17/2020 1205   RDW 13.8 02/17/2020 1205   LYMPHSABS 0.9 02/17/2020 1205   MONOABS 0.4 02/17/2020 1205   EOSABS 0.3  02/17/2020 1205   BASOSABS 0.0 02/17/2020 1205    BMET    Component Value Date/Time   NA 139 01/05/2022 1509   NA 143 11/10/2021 1339   K 3.7 01/05/2022 1509   CL 107 01/05/2022 1509   CO2 26 01/05/2022 1509   GLUCOSE 183 (H) 01/05/2022 1509   BUN 21 01/05/2022 1509   BUN 16 11/10/2021 1339   CREATININE 0.66 01/05/2022 1509   CALCIUM 9.0 01/05/2022 1509   GFRNONAA >60 01/05/2022 1509   GFRAA >60 11/12/2019 1134    BNP No results found for: "BNP"   Imaging:  No results found.  Administration History     None          Latest Ref Rng & Units 11/22/2022    1:52 PM 10/07/2021    2:02 PM  02/19/2021   11:00 AM 01/13/2020    3:57 PM  PFT Results  FVC-Pre L 1.18  1.06  1.26  1.47   FVC-Predicted Pre % 38  34  40  46   FVC-Post L  1.11  1.21  1.51   FVC-Predicted Post %  35  38  47   Pre FEV1/FVC % % 89  88  89  81   Post FEV1/FCV % %  94  92  78   FEV1-Pre L 1.05  0.93  1.13  1.19   FEV1-Predicted Pre % 45  39  47  49   FEV1-Post L  1.05  1.11  1.17   DLCO uncorrected ml/min/mmHg 7.13  4.33  7.83  12.22   DLCO UNC% % 36  21  38  60   DLCO corrected ml/min/mmHg    12.22   DLCO COR %Predicted %    60   DLVA Predicted % 101  68  116  112   TLC L  2.24  3.45  3.49   TLC % Predicted %  43  66  67   RV % Predicted %  50  102  90     No results found for: "NITRICOXIDE"      Assessment & Plan:   No problem-specific Assessment & Plan notes found for this encounter.  Assessment and Plan    Idiopathic Pulmonary Fibrosis Symptom wise she is stable. Currently on Esbriet (pirfenidone), causing nausea. Advised prophylactic Zofran to manage nausea.  - Administer Zofran 30 minutes before Esbriet in AM to prevent nausea. - Recheck liver function tests today. - Schedule CT scan and pulmonary function testing for end of May.  Severe protein calorie malnutrition  Experiencing appetite loss and weight reduction from 116 lbs to 113 lbs. Using nutritional supplements like  Boost and Ensure. Discussed appetite stimulants, including Marinol and Megace, vs potential use of CBD gummies if not covered by insurance. Marinol, a synthetic cannabinoid, may aid appetite and even nausea. Side effect profile reviewed.  - Prescribe Marinol and verify insurance coverage. - Consider CBD gummies if Marinol is not covered or is too costly.  Hypothyroidism Exhibiting symptoms of cold extremities, fatigue, and constipation. TSH level at 10.1, indicating hypothyroidism. Synthroid dose increased from 125 mcg to 137 mcg two weeks ago, with slight symptom improvement. Endocrinology referral may be necessary if thyroid levels remain uncontrolled. - Maintain current Synthroid dose of 137 mcg. - Monitor thyroid levels and symptoms. - Consider endocrinology referral if thyroid levels remain uncontrolled.  Follow-up Follow-up scheduled to assess lung function and CT scan results. - Follow-up appointment on June 12 to review CT scan and pulmonary function test results.        I spent 28 minutes of dedicated to the care of this patient on the date of this encounter to include pre-visit review of records, face-to-face time with the patient discussing conditions above, post visit ordering of testing, clinical documentation with the electronic health record, making appropriate referrals as documented, and communicating necessary findings to members of the patients care team.  Noemi Chapel, NP 09/07/2023  Pt aware and understands NP's role.

## 2023-09-08 ENCOUNTER — Telehealth: Payer: Self-pay

## 2023-09-08 ENCOUNTER — Encounter: Payer: Self-pay | Admitting: Nurse Practitioner

## 2023-09-08 LAB — HEPATIC FUNCTION PANEL
ALT: 5 IU/L (ref 0–32)
AST: 16 IU/L (ref 0–40)
Albumin: 4.3 g/dL (ref 3.8–4.8)
Alkaline Phosphatase: 91 IU/L (ref 44–121)
Bilirubin Total: 0.3 mg/dL (ref 0.0–1.2)
Bilirubin, Direct: 0.12 mg/dL (ref 0.00–0.40)
Total Protein: 7.2 g/dL (ref 6.0–8.5)

## 2023-09-08 NOTE — Telephone Encounter (Signed)
*  Pulm  Pharmacy Patient Advocate Encounter   Received notification from CoverMyMeds that prior authorization for droNABinol 2.5MG  capsules  is required/requested.   Insurance verification completed.   The patient is insured through [facility] .   Per test claim: PA required; PA submitted to above mentioned insurance via CoverMyMeds Key/confirmation #/EOC [id number] Status is pending

## 2023-09-08 NOTE — Telephone Encounter (Signed)
 Pharmacy Patient Advocate Encounter  Received notification from [facility] that Prior Authorization for droNABinol 2.5MG  capsules  has been DENIED.  Full denial letter will be uploaded to the media tab. See denial reason below.   Drugs when used for anorexia, weight loss, or weight gain are excluded from coverage under Medicare rules. Please refer to your Evidence of Coverage (EOC) section that references Part D drug coverage in your pharmacy plan documents for more information

## 2023-09-11 NOTE — Telephone Encounter (Signed)
Routing to [employer] as [employer]

## 2023-09-25 ENCOUNTER — Telehealth: Payer: Self-pay | Admitting: Nurse Practitioner

## 2023-09-25 DIAGNOSIS — J84112 Idiopathic pulmonary fibrosis: Secondary | ICD-10-CM

## 2023-09-25 NOTE — Telephone Encounter (Signed)
 Copied from CRM [id number]. Topic: Appointments - Scheduling Inquiry for Clinic >> Sep 25, 2023  3:54 PM Debra Jimenez wrote: Reason for CRM: Patient was advised that she would get a call to schedule her CT Scan and Lung Function Testing - she believes these tests would be done at [facility]. Patient sees [name] and she mentioned that she wanted to have these tests done in [facility] May - she is scheduled to follow up with her on 11/30/2023. Please call the patient for further assistance.

## 2023-09-26 ENCOUNTER — Telehealth: Payer: Self-pay

## 2023-09-26 NOTE — Telephone Encounter (Signed)
 CT & PFT order is pended per lov note "Repeat lung function testing and CT scan mid to end of May".   Called and spoke with pt and she prefers scan and pft be done at [facility]. Pt also wanted to make sure Florentina Addison was aware that Authorization for Dronabinol was declined. Pt is concerned if she could take CBD gummies instead and she would like information about them. Please advise [name] for Ct order & PFT.

## 2023-09-26 NOTE — Telephone Encounter (Signed)
 Copied from CRM [id number]. Topic: Clinical - Prescription Issue >> Sep 25, 2023  3:57 PM Gaetano Hawthorne wrote: Reason for CRM: Patient was notified that prescription Marinol 2.5 mg was denied by her insurance (even with prior authorization) - Patient would like to know if there's any alternative medicine - CBD gummies were mentioned to her but she wanted to check with her provider, Debra Jimenez. Please call and discuss alternatives with the patient.  [name], please advise.

## 2023-09-27 NOTE — Telephone Encounter (Signed)
 Orders placed & pt is aware. Routing to [facility] to schedule pft & CT.  Debra Jimenez can you please advise pt has concerns if she should take CBD gummies since the Dronabinol was declined.

## 2023-09-27 NOTE — Telephone Encounter (Signed)
 All of the other appetite stimulations aren't covered by her insurance unfortunately. She can look into the alternative options but ensure she is looking at things for appetite stimulant. Thanks!

## 2023-09-27 NOTE — Telephone Encounter (Signed)
 These are technically all "over the counter" but some research has shown that CBD can help with nausea, which can help with appetite as well. These are not FDA regulated so I would recommend doing research on brands and talking to the local dispensaries for more guidance on brands if she's interested in trying them to see if they help curve her nausea/improve her appetite. There's also been some research showing multivitamins with lysine and zinc can help stimulate the appetite as well.

## 2023-09-27 NOTE — Telephone Encounter (Signed)
 ATC X1. LMTCB

## 2023-09-27 NOTE — Telephone Encounter (Signed)
 I called and spoke to pt. Pt informed of Debra Croft NP message. Pt states CBD gummy's were mentioned to her and wanted to know if this was still a good option for her/ Pt wanted to know if there were other suggestions if CBD gummy's were not approved by Debra Jimenez. Please advise.

## 2023-09-27 NOTE — Telephone Encounter (Signed)
 The PFT order will need to specify [facility]. The CT order can be scheduled anywhere by the [facility].

## 2023-09-28 NOTE — Telephone Encounter (Signed)
 ATC X2. Lmtcb. Sending message on Mychart and completing note per protocol.

## 2023-09-30 ENCOUNTER — Other Ambulatory Visit: Payer: Self-pay | Admitting: Pulmonary Disease

## 2023-10-02 NOTE — Telephone Encounter (Signed)
 Yes, [place] to refill

## 2023-10-02 NOTE — Telephone Encounter (Signed)
 [name], please advise if okay to refill. This medication is not mentioned in last OV note.  Dr. Villa Greaser is unavailable.

## 2023-10-02 NOTE — Telephone Encounter (Addendum)
 Noted.  Rx sent.  Nothing further needed.

## 2023-10-04 NOTE — Telephone Encounter (Signed)
 This was my last message regarding this. "These are technically all "over the counter" but some research has shown that CBD can help with nausea, which can help with appetite as well. These are not FDA regulated so I would recommend doing research on brands and talking to the local dispensaries for more guidance on brands if she's interested in trying them to see if they help curve her nausea/improve her appetite. There's also been some research showing multivitamins with lysine and zinc can help stimulate the appetite as well"

## 2023-10-04 NOTE — Telephone Encounter (Signed)
 Sherry with Respiratory has the patients PFT scheduled on 11/16/23 at [facility]

## 2023-10-05 ENCOUNTER — Other Ambulatory Visit: Payer: Self-pay

## 2023-10-09 ENCOUNTER — Telehealth: Admitting: Nurse Practitioner

## 2023-10-13 NOTE — Telephone Encounter (Signed)
 MyChart msg was sent regarding this. NFN

## 2023-10-26 ENCOUNTER — Telehealth (HOSPITAL_BASED_OUTPATIENT_CLINIC_OR_DEPARTMENT_OTHER): Payer: Self-pay | Admitting: Pulmonary Disease

## 2023-10-26 NOTE — Telephone Encounter (Signed)
 Per chart review-ondansetron  (ZOFRAN0 4 MG tablet was originally filled on 09/25/2023 for 30 tablets BID PRN Nausea and vomiting with 1 refill.   [facility] was called and verified that patient still has 1 refill available on ondansetron  prescription.  Patient called and updated on refill availability. Patient states she will call the pharmacy and have them fill the available refill. No other needs at this time.

## 2023-10-26 NOTE — Telephone Encounter (Signed)
 Copied from CRM [id number]. Topic: Clinical - Medication Refill >> Oct 26, 2023 10:15 AM Hilton Lucky wrote: Medication: ondansetron  (ZOFRAN ) 4 MG tablet  Has the patient contacted their pharmacy? No - Patient states bottle says no refills.   This is the patient's preferred pharmacy:   [facility] - [place], [place] - [address] [place] [address] Phone: [phone] Fax: [phone]   Is this the correct pharmacy for this prescription? Yes If no, delete pharmacy and type the correct one.   Has the prescription been filled recently? No  Is the patient out of the medication? Yes  Has the patient been seen for an appointment in the last year OR does the patient have an upcoming appointment? Yes  Can we respond through MyChart? Yes  Agent: Please be advised that Rx refills may take up to 3 business days. We ask that you follow-up with your pharmacy.

## 2023-10-30 ENCOUNTER — Other Ambulatory Visit: Payer: Self-pay

## 2023-10-31 ENCOUNTER — Other Ambulatory Visit: Payer: Self-pay | Admitting: Pharmacy Technician

## 2023-10-31 ENCOUNTER — Other Ambulatory Visit: Payer: Self-pay

## 2023-10-31 ENCOUNTER — Other Ambulatory Visit (HOSPITAL_COMMUNITY): Payer: Self-pay

## 2023-10-31 NOTE — Progress Notes (Signed)
 Specialty Pharmacy Refill Coordination Note  Debra Jimenez is a 73 y.o. female contacted today regarding refills of specialty medication(s) Pirfenidone    Patient requested Delivery   Delivery date: 12/08/23   Verified address: [address]  [place] [address]   Medication will be filled on 12/07/23.

## 2023-11-01 ENCOUNTER — Ambulatory Visit (HOSPITAL_BASED_OUTPATIENT_CLINIC_OR_DEPARTMENT_OTHER): Payer: Self-pay | Admitting: Pulmonary Disease

## 2023-11-01 ENCOUNTER — Other Ambulatory Visit: Payer: Self-pay

## 2023-11-01 NOTE — Telephone Encounter (Signed)
 Copied from CRM [id number]. Topic: Clinical - Red Word Triage >> Nov 01, 2023  1:30 PM Hilton Lucky wrote: Red Word that prompted transfer to Nurse Triage:  Sudden onset panic attacks and breathing problems (SOB, chest tightness) per patient. States it lasts for as long as she is up and trying to do anything. Attributes it to anxiety. Oxygen hovering between 85 and 87 with a maximum of 93.  Interested in getting on oxygen, would like information on the process to get that started since she know's she is eligible.    Chief Complaint: Shortness of Breath on exertion Symptoms: shortness of breath Frequency: last couple of weeks Pertinent Negatives: Patient denies coughing, chest pain, dizziness, runny nose, fever Disposition: [] ED /[] Urgent Care (no appt availability in office) / [] Appointment(In office/virtual)/ []  [facility]/ [] Home Care/ [x] Refused Recommended Disposition /[]  Mobile Bus/ []  Follow-up with PCP Additional Notes: Patient called and advised that she is used to having shortness of breath but it is happening more often now. She states that her oxygen levels are dropping down to about 85% when exerting herself but if she sits down and rests it comes back up to about 93%. Patient denies any dizziness, chest pain, coughing, fever, or runny nose.  Patient states that she is dealing with some personal family problems that she feels are making her anxiety go up as well.  Patient is due for a follow up with Girard Lam for results from recent tests.  Patient states that she has had to take a Xanax every day. Patient has also been taking CBD gummies as recommended---two a day.  Patient states her daughter is concerned and thinking that the patient needs to be on Oxygen. Patient states that she wants to get on Oxygen now and wants to know what the process for that is. Patient wants advice from her PCP about if she should increase her anxiety medication  or if she can  go ahead and get on home Oxygen.  Patient doesn't want to make an appointment at this time. Patient states that her episodes of shortness of breath/panic are off and on episodes but if she gets to a point where she cannot control it by resting she will seek immediate medical attention either at an Urgent Care or at the Emergency Room. She just wanted some advice from either Dr Villa Greaser or Girard Lam about home Oxygen and/or increasing anxiety medication until she can come in next month to follow up on the upcoming tests scheduled.  Patient is advised that if anything gets worse to go to the Emergency Room or call an ambulance to take her to the Emergency Room. Patient verbalized understanding    Reason for Disposition  [1] MILD difficulty breathing (e.g., minimal/no SOB at rest, SOB with walking, pulse <100) AND [2] NEW-onset or WORSE than normal  Answer Assessment - Initial Assessment Questions E2C2 Pulmonary Triage - Initial Assessment Questions "Chief Complaint (e.g., cough, sob, wheezing, fever, chills, sweat or additional symptoms) *Go to specific symptom protocol after initial questions. Shortness of breath on exertion, feels like panic episodes   "How long have symptoms been present?" The past couple of weeks  Have you tested for COVID or Flu? Note: If not, ask patient if a home test can be taken. If so, instruct patient to call back for positive results. No  MEDICINES:   "Have you used any OTC meds to help with symptoms?" Yes If yes, ask "What medications?" CBD gummies  "Have you  used your inhalers/maintenance medication?" Yes If yes, "What medications?" Albuterol  inhaler--states she never got any results so she doesn't really use it  If inhaler, ask "How many puffs and how often?" Note: Review instructions on medication in the chart. ----  OXYGEN: "Do you wear supplemental oxygen?" No If yes, "How many liters are you supposed to use?" No  "Do you monitor your oxygen  levels?" Yes If yes, "What is your reading (oxygen level) today?" May go to 85% but then it comes back up to 93%  "What is your usual oxygen saturation reading?"  (Note: Pulmonary O2 sats should be 90% or greater) 93-94%    1. RESPIRATORY STATUS: "Describe your breathing?" (e.g., wheezing, shortness of breath, unable to speak, severe coughing)      Shortness of breath on exertion, panic episodes 2. ONSET: "When did this breathing problem begin?"      Last couple of weeks 3. PATTERN "Does the difficult breathing come and go, or has it been constant since it started?"      Comes and goes with exertion 4. SEVERITY: "How bad is your breathing?" (e.g., mild, moderate, severe)    - MILD: No SOB at rest, mild SOB with walking, speaks normally in sentences, can lie down, no retractions, pulse < 100.    - MODERATE: SOB at rest, SOB with minimal exertion and prefers to sit, cannot lie down flat, speaks in phrases, mild retractions, audible wheezing, pulse 100-120.    - SEVERE: Very SOB at rest, speaks in single words, struggling to breathe, sitting hunched forward, retractions, pulse > 120      Mild--just on exertion but getting worse 5. RECURRENT SYMPTOM: "Have you had difficulty breathing before?" If Yes, ask: "When was the last time?" and "What happened that time?"      No 6. CARDIAC HISTORY: "Do you have any history of heart disease?" (e.g., heart attack, angina, bypass surgery, angioplasty)      No 7. LUNG HISTORY: "Do you have any history of lung disease?"  (e.g., pulmonary embolus, asthma, emphysema)     Pulmonary Fibrosis 8. CAUSE: "What do you think is causing the breathing problem?"      Unknown 9. OTHER SYMPTOMS: "Do you have any other symptoms? (e.g., dizziness, runny nose, cough, chest pain, fever)     No 12. TRAVEL: "Have you traveled out of the country in the last month?" (e.g., travel history, exposures)       No  Protocols used: Breathing Difficulty-A-AH

## 2023-11-01 NOTE — Telephone Encounter (Signed)
 We cannot order oxygen without a recent OV documenting patient's O2 sats requiring need for supplemental O2. This has not been done.   If patient is truly experiencing O2 levels in the low-mid 80's she needs urgent medical evaluation.   LVM for return call. MyChart message also sent.

## 2023-11-01 NOTE — Telephone Encounter (Signed)
 Spoke with patient at length about her triage message. She states she has only seen low-mid 80's O2 levels "maybe once or twice" in the past few weeks. She definitely attributes her shob to her anxiety and panic attacks. She states she has been under increased stress over the past few weeks with some personal family matters. She has been feeling the need to take Xanax more frequently. Advised her to contact PCP about possible daily medication for her anxiety symptoms as we cannot manage those types of medications. Also recommended she learn some breathing exercises to help with her shob during her panic attacks. Advised her to double check her O2 levels on her ring finger or her ear, if possible; and to make sure her hands are warm, she states they are frequently cold, suggested she use warm water  prior to checking O2 levels. She voiced understanding to all of the above, will keep CT/PFT appts and OV with [name]. She is aware to contact office if she has additional questions or concerns. Nothing further needed at this time.

## 2023-11-02 ENCOUNTER — Ambulatory Visit (HOSPITAL_BASED_OUTPATIENT_CLINIC_OR_DEPARTMENT_OTHER): Payer: Self-pay | Admitting: Pulmonary Disease

## 2023-11-02 ENCOUNTER — Other Ambulatory Visit: Payer: Self-pay

## 2023-11-02 ENCOUNTER — Emergency Department (HOSPITAL_COMMUNITY)

## 2023-11-02 ENCOUNTER — Encounter (HOSPITAL_COMMUNITY): Payer: Self-pay

## 2023-11-02 ENCOUNTER — Inpatient Hospital Stay (HOSPITAL_COMMUNITY)
Admission: EM | Admit: 2023-11-02 | Discharge: 2023-11-07 | DRG: 196 | Disposition: A | Discharge status: Home / Self Care | Attending: Family Medicine | Admitting: Family Medicine

## 2023-11-02 DIAGNOSIS — Z96641 Presence of right artificial hip joint: Secondary | ICD-10-CM | POA: Diagnosis not present

## 2023-11-02 DIAGNOSIS — J9621 Acute and chronic respiratory failure with hypoxia: Secondary | ICD-10-CM | POA: Diagnosis present

## 2023-11-02 DIAGNOSIS — Z79899 Other long term (current) drug therapy: Secondary | ICD-10-CM

## 2023-11-02 DIAGNOSIS — F419 Anxiety disorder, unspecified: Secondary | ICD-10-CM | POA: Diagnosis present

## 2023-11-02 DIAGNOSIS — J929 Pleural plaque without asbestos: Secondary | ICD-10-CM | POA: Diagnosis not present

## 2023-11-02 DIAGNOSIS — I10 Essential (primary) hypertension: Secondary | ICD-10-CM | POA: Diagnosis present

## 2023-11-02 DIAGNOSIS — E039 Hypothyroidism, unspecified: Secondary | ICD-10-CM | POA: Diagnosis not present

## 2023-11-02 DIAGNOSIS — Z888 Allergy status to other drugs, medicaments and biological substances status: Secondary | ICD-10-CM

## 2023-11-02 DIAGNOSIS — J849 Interstitial pulmonary disease, unspecified: Secondary | ICD-10-CM | POA: Diagnosis not present

## 2023-11-02 DIAGNOSIS — J9601 Acute respiratory failure with hypoxia: Secondary | ICD-10-CM | POA: Diagnosis not present

## 2023-11-02 DIAGNOSIS — J84112 Idiopathic pulmonary fibrosis: Principal | ICD-10-CM

## 2023-11-02 DIAGNOSIS — J9611 Chronic respiratory failure with hypoxia: Secondary | ICD-10-CM

## 2023-11-02 DIAGNOSIS — Z87891 Personal history of nicotine dependence: Secondary | ICD-10-CM | POA: Diagnosis not present

## 2023-11-02 DIAGNOSIS — F32A Depression, unspecified: Secondary | ICD-10-CM | POA: Diagnosis present

## 2023-11-02 DIAGNOSIS — Z881 Allergy status to other antibiotic agents status: Secondary | ICD-10-CM

## 2023-11-02 DIAGNOSIS — G4700 Insomnia, unspecified: Secondary | ICD-10-CM | POA: Diagnosis not present

## 2023-11-02 DIAGNOSIS — Z1152 Encounter for screening for COVID-19: Secondary | ICD-10-CM

## 2023-11-02 DIAGNOSIS — I700 Atherosclerosis of aorta: Secondary | ICD-10-CM | POA: Diagnosis not present

## 2023-11-02 DIAGNOSIS — J449 Chronic obstructive pulmonary disease, unspecified: Secondary | ICD-10-CM | POA: Diagnosis not present

## 2023-11-02 DIAGNOSIS — Z79890 Hormone replacement therapy (postmenopausal): Secondary | ICD-10-CM

## 2023-11-02 DIAGNOSIS — R0602 Shortness of breath: Secondary | ICD-10-CM | POA: Diagnosis not present

## 2023-11-02 DIAGNOSIS — R918 Other nonspecific abnormal finding of lung field: Secondary | ICD-10-CM | POA: Diagnosis not present

## 2023-11-02 DIAGNOSIS — R079 Chest pain, unspecified: Secondary | ICD-10-CM | POA: Diagnosis not present

## 2023-11-02 HISTORY — DX: Anxiety disorder, unspecified: F41.9

## 2023-11-02 HISTORY — DX: Idiopathic pulmonary fibrosis: J84.112

## 2023-11-02 LAB — CBC WITH DIFFERENTIAL/PLATELET
Abs Immature Granulocytes: 0.02 10*3/uL (ref 0.00–0.07)
Basophils Absolute: 0 10*3/uL (ref 0.0–0.1)
Basophils Relative: 0 %
Eosinophils Absolute: 0.3 10*3/uL (ref 0.0–0.5)
Eosinophils Relative: 3 %
HCT: 42.8 % (ref 36.0–46.0)
Hemoglobin: 13.6 g/dL (ref 12.0–15.0)
Immature Granulocytes: 0 %
Lymphocytes Relative: 10 %
Lymphs Abs: 0.8 10*3/uL (ref 0.7–4.0)
MCH: 30 pg (ref 26.0–34.0)
MCHC: 31.8 g/dL (ref 30.0–36.0)
MCV: 94.3 fL (ref 80.0–100.0)
Monocytes Absolute: 0.7 10*3/uL (ref 0.1–1.0)
Monocytes Relative: 9 %
Neutro Abs: 5.6 10*3/uL (ref 1.7–7.7)
Neutrophils Relative %: 78 %
Platelets: 201 10*3/uL (ref 150–400)
RBC: 4.54 MIL/uL (ref 3.87–5.11)
RDW: 12.5 % (ref 11.5–15.5)
WBC: 7.4 10*3/uL (ref 4.0–10.5)
nRBC: 0 % (ref 0.0–0.2)

## 2023-11-02 LAB — RESP PANEL BY RT-PCR (RSV, FLU A&B, COVID)  RVPGX2
Influenza A by PCR: NEGATIVE
Influenza B by PCR: NEGATIVE
Resp Syncytial Virus by PCR: NEGATIVE
SARS Coronavirus 2 by RT PCR: NEGATIVE

## 2023-11-02 LAB — COMPREHENSIVE METABOLIC PANEL WITH GFR
ALT: 10 U/L (ref 0–44)
AST: 16 U/L (ref 15–41)
Albumin: 3.9 g/dL (ref 3.5–5.0)
Alkaline Phosphatase: 71 U/L (ref 38–126)
Anion gap: 9 (ref 5–15)
BUN: 22 mg/dL (ref 8–23)
CO2: 29 mmol/L (ref 22–32)
Calcium: 9.9 mg/dL (ref 8.9–10.3)
Chloride: 99 mmol/L (ref 98–111)
Creatinine, Ser: 0.72 mg/dL (ref 0.44–1.00)
GFR, Estimated: >60 mL/min (ref >60)
Glucose, Bld: 126 mg/dL — ABNORMAL HIGH (ref 70–99)
Potassium: 3.8 mmol/L (ref 3.5–5.1)
Sodium: 137 mmol/L (ref 135–145)
Total Bilirubin: 1 mg/dL (ref 0.0–1.2)
Total Protein: 7.6 g/dL (ref 6.5–8.1)

## 2023-11-02 LAB — BLOOD GAS, VENOUS
Acid-Base Excess: 10.4 mmol/L — ABNORMAL HIGH (ref 0.0–2.0)
Bicarbonate: 36.7 mmol/L — ABNORMAL HIGH (ref 20.0–28.0)
Drawn by: 67337
O2 Saturation: 33.1 %
Patient temperature: 36.6
pCO2, Ven: 53 mmHg (ref 44–60)
pH, Ven: 7.45 — ABNORMAL HIGH (ref 7.25–7.43)
pO2, Ven: <31 mmHg — CL (ref 32–45)

## 2023-11-02 LAB — BRAIN NATRIURETIC PEPTIDE: B Natriuretic Peptide: 17 pg/mL (ref 0.0–100.0)

## 2023-11-02 LAB — TROPONIN I (HIGH SENSITIVITY)
Troponin I (High Sensitivity): 3 ng/L (ref <18)
Troponin I (High Sensitivity): 5 ng/L (ref <18)

## 2023-11-02 MED ORDER — ZOLPIDEM TARTRATE 5 MG PO TABS
5.0000 mg | ORAL_TABLET | Freq: Every day | ORAL | Status: DC
  Administered 2023-11-02: 5 mg via ORAL
  Filled 2023-11-02: qty 1

## 2023-11-02 MED ORDER — PIRFENIDONE 801 MG PO TABS
801.0000 mg | ORAL_TABLET | Freq: Three times a day (TID) | ORAL | Status: DC
  Administered 2023-11-03 – 2023-11-07 (×12): 801 mg via ORAL
  Filled 2023-11-02 (×3): qty 1

## 2023-11-02 MED ORDER — GUAIFENESIN-DM 100-10 MG/5ML PO SYRP: 15.0000 mL | ORAL_SOLUTION | Freq: Three times a day (TID) | ORAL | Status: DC | PRN

## 2023-11-02 MED ORDER — METHYLPREDNISOLONE SODIUM SUCC 125 MG IJ SOLR
60.0000 mg | Freq: Two times a day (BID) | INTRAMUSCULAR | Status: DC
  Administered 2023-11-02 – 2023-11-07 (×10): 60 mg via INTRAVENOUS
  Filled 2023-11-02 (×10): qty 2

## 2023-11-02 MED ORDER — ACETAMINOPHEN 325 MG PO TABS: 650.0000 mg | ORAL_TABLET | Freq: Four times a day (QID) | ORAL | Status: DC | PRN

## 2023-11-02 MED ORDER — ALBUTEROL SULFATE (2.5 MG/3ML) 0.083% IN NEBU: 2.5000 mg | INHALATION_SOLUTION | RESPIRATORY_TRACT | Status: DC | PRN

## 2023-11-02 MED ORDER — ALPRAZOLAM 0.25 MG PO TABS
0.2500 mg | ORAL_TABLET | Freq: Every day | ORAL | Status: DC | PRN
  Administered 2023-11-03 – 2023-11-06 (×4): 0.25 mg via ORAL
  Filled 2023-11-02 (×4): qty 1

## 2023-11-02 MED ORDER — LEVOTHYROXINE SODIUM 137 MCG PO TABS
137.0000 ug | ORAL_TABLET | Freq: Every day | ORAL | Status: DC
  Administered 2023-11-03 – 2023-11-07 (×5): 137 ug via ORAL
  Filled 2023-11-02 (×5): qty 1

## 2023-11-02 MED ORDER — IOHEXOL 350 MG/ML SOLN
75.0000 mL | Freq: Once | INTRAVENOUS | Status: AC | PRN
  Administered 2023-11-02: 75 mL via INTRAVENOUS

## 2023-11-02 MED ORDER — LABETALOL HCL 5 MG/ML IV SOLN
10.0000 mg | INTRAVENOUS | Status: DC | PRN
  Administered 2023-11-02: 10 mg via INTRAVENOUS
  Filled 2023-11-02: qty 4

## 2023-11-02 MED ORDER — ONDANSETRON HCL 4 MG/2ML IJ SOLN: 4.0000 mg | Freq: Four times a day (QID) | INTRAMUSCULAR | Status: DC | PRN

## 2023-11-02 MED ORDER — ENOXAPARIN SODIUM 40 MG/0.4ML IJ SOSY
40.0000 mg | PREFILLED_SYRINGE | INTRAMUSCULAR | Status: DC
  Administered 2023-11-03 – 2023-11-07 (×5): 40 mg via SUBCUTANEOUS
  Filled 2023-11-02 (×5): qty 0.4

## 2023-11-02 MED ORDER — POLYETHYLENE GLYCOL 3350 17 G PO PACK
17.0000 g | PACK | Freq: Every day | ORAL | Status: DC | PRN
  Administered 2023-11-06: 17 g via ORAL
  Filled 2023-11-02: qty 1

## 2023-11-02 MED ORDER — ACETAMINOPHEN 650 MG RE SUPP: 650.0000 mg | Freq: Four times a day (QID) | RECTAL | Status: DC | PRN

## 2023-11-02 MED ORDER — ONDANSETRON HCL 4 MG PO TABS
4.0000 mg | ORAL_TABLET | Freq: Four times a day (QID) | ORAL | Status: DC | PRN
  Administered 2023-11-03 – 2023-11-06 (×3): 4 mg via ORAL
  Filled 2023-11-02 (×3): qty 1

## 2023-11-02 MED ORDER — HYDRALAZINE HCL 20 MG/ML IJ SOLN
10.0000 mg | INTRAMUSCULAR | Status: DC | PRN
  Filled 2023-11-02: qty 1

## 2023-11-02 NOTE — H&P (Signed)
 History and Physical    Debra Jimenez ZOX:[id number] DOB: [birth year] DOA: 11/02/2023  PCP: Lizabeth Riggs, PA-C   Patient coming from: Home  I have personally briefly reviewed patient's old medical records in [facility]  Chief Complaint: Difficulty breathing  HPI: Debra Jimenez is a 73 y.o. female with medical history significant for idiopathic pulmonary fibrosis, hypertension, depression, hypothyroidism.   Patient presents to the ED with complaints of increasing difficulty breathing over the past 2 weeks.  She has a chronic mild cough that is unchanged.  No chest pain no lower extremity swelling.  No fevers no chills.  She is not on home O2. She reports difficulty breathing with exertion, with O2 sat dropping to 79% on room air at home.  He has symptoms were so severe after having her back in his presentation to the ED.  She also reports fatigue.  Husband has Alzheimer's and she has been under stress, she felt some of her difficulty breathing was related to anxiety.  ED Course: O2 sats down to 75% on room air.  Temperature 97.9.  Heart rate 82-111.  Respirate rate 12-4.  Blood pressure systolic 158-197. VBG shows pH of 7.45.  BNP 17. CTA chest negative for PE, shows severe fibrotic interstitial lung disease. Hospitalist to admit for acute hypoxic respiratory failure  Review of Systems: As per HPI all other systems reviewed and negative.  Past Medical History:  Diagnosis Date   Anxiety    Hypothyroidism    Idiopathic pulmonary fibrosis (HCC)    OA (osteoarthritis)    RIGHT HIP AND LEFT HIP   Wears glasses     Past Surgical History:  Procedure Laterality Date   BLADDER TACK  2014  APPROX.   NO SLING   TOTAL HIP ARTHROPLASTY Right 07/17/2018   Procedure: TOTAL HIP ARTHROPLASTY ANTERIOR APPROACH;  Surgeon: Claiborne Crew, MD;  Location: [facility];  Service: Orthopedics;  Laterality: Right;  70 mins   VAGINAL HYSTERECTOMY  2003   w/  BSO     reports that she quit  smoking about 40 years ago. Her smoking use included cigarettes. She started smoking about 55 years ago. She has a 7.5 pack-year smoking history. She has never used smokeless tobacco. She reports that she does not currently use alcohol . She reports that she does not use drugs.  Allergies  Allergen Reactions   Atorvastatin Other (See Comments)     Muscle aches. Has tried 2 different statins and unable to tolerate   Doxycycline Nausea And Vomiting    Intolerance due to perfenidone    Family History of hypertension.  Prior to Admission medications   Medication Sig Start Date End Date Taking? Authorizing Provider  ALPRAZolam (XANAX) 0.25 MG tablet Take 0.25 mg by mouth daily as needed for anxiety or sleep. 11/05/19  Yes [provider]  docusate sodium  (COLACE) 100 MG capsule Take 100 mg by mouth daily as needed for mild constipation.   Yes [provider]  ezetimibe (ZETIA) 10 MG tablet Take 10 mg by mouth daily. 11/26/22  Yes [provider]  levothyroxine  (SYNTHROID ) 137 MCG tablet Take 137 mcg by mouth every morning. 08/29/23  Yes [provider]  losartan (COZAAR) 50 MG tablet Take 50 mg by mouth daily. 12/07/19  Yes [provider]  ondansetron  (ZOFRAN ) 4 MG tablet TAKE 1 TABLET BY MOUTH TWICE DAILY AS NEEDED FOR NAUSEA AND VOMITING Patient taking differently: Take 4 mg by mouth every 8 (eight) hours as needed for nausea  or vomiting. 10/02/23  Yes Cobb, Mariah Shines, NP  OVER THE COUNTER MEDICATION Take 1-2 tablets by mouth daily. CBD gummies for appetite stimulation   Yes [provider]  Pirfenidone  (ESBRIET ) 801 MG TABS Take 1 tablet (801 mg total) by mouth 3 (three) times daily with meals. 07/14/23  Yes Alva, Rakesh V, MD  polyethylene glycol (MIRALAX  / GLYCOLAX ) 17 g packet Take 17 g by mouth daily as needed for mild constipation.   Yes [provider]  zolpidem  (AMBIEN ) 5 MG tablet Take 5 mg by mouth at bedtime. 03/16/21  Yes  [provider]    Physical Exam: Vitals:   11/02/23 1941 11/02/23 1942 11/02/23 1943 11/02/23 2015  BP:    (!) 165/103  Pulse: (!) 111 (!) 108 100 82  Resp:      Temp:      SpO2: (!) 75% (!) 79% (!) 86% 98%  Weight:      Height:        Constitutional: NAD, calm, comfortable Vitals:   11/02/23 1941 11/02/23 1942 11/02/23 1943 11/02/23 2015  BP:    (!) 165/103  Pulse: (!) 111 (!) 108 100 82  Resp:      Temp:      SpO2: (!) 75% (!) 79% (!) 86% 98%  Weight:      Height:       Eyes: PERRL, lids and conjunctivae normal ENMT: Mucous membranes are moist.  Neck: normal, supple, no masses, no thyromegaly Respiratory: Diffuse crackles, no wheezing.  Normal respiratory effort, no accessory muscle use Cardiovascular: Regular rate and rhythm, no murmurs / rubs / gallops. No extremity edema.  Extremities warm   Abdomen: no tenderness, no masses palpated. No hepatosplenomegaly. Bowel sounds positive.  Musculoskeletal: no clubbing / cyanosis. No joint deformity upper and lower extremities. Skin: no rashes, lesions, ulcers. No induration Neurologic: No facial asymmetry, extremity spontaneously, speech fluent.  Psychiatric: Normal judgment and insight. Alert and oriented x 3. Normal mood.   Labs on Admission: I have personally reviewed following labs and imaging studies  CBC: Recent Labs  Lab 11/02/23 1758  WBC 7.4  NEUTROABS 5.6  HGB 13.6  HCT 42.8  MCV 94.3  PLT 201   Basic Metabolic Panel: Recent Labs  Lab 11/02/23 1758  NA 137  K 3.8  CL 99  CO2 29  GLUCOSE 126*  BUN 22  CREATININE 0.72  CALCIUM 9.9   GFR: Estimated Creatinine Clearance: 50.8 mL/min (by C-G formula based on SCr of 0.72 mg/dL). Liver Function Tests: Recent Labs  Lab 11/02/23 1758  AST 16  ALT 10  ALKPHOS 71  BILITOT 1.0  PROT 7.6  ALBUMIN 3.9   Radiological Exams on Admission: CT Angio Chest PE W and/or Wo Contrast Result Date: 11/02/2023 CLINICAL DATA:  Pulmonary embolism (PE)  suspected, high prob. Shortness of breath EXAM: CT ANGIOGRAPHY CHEST WITH CONTRAST TECHNIQUE: Multidetector CT imaging of the chest was performed using the standard protocol during bolus administration of intravenous contrast. Multiplanar CT image reconstructions and MIPs were obtained to evaluate the vascular anatomy. RADIATION DOSE REDUCTION: This exam was performed according to the departmental dose-optimization program which includes automated exposure control, adjustment of the mA and/or kV according to patient size and/or use of iterative reconstruction technique. CONTRAST:  75mL OMNIPAQUE  IOHEXOL  350 MG/ML SOLN COMPARISON:  10/24/2022 FINDINGS: Cardiovascular: Heart is normal size. Aorta is normal caliber. Scattered aortic calcifications. No evidence of aortic dissection. No filling defects in the pulmonary arteries to suggest pulmonary emboli.  Mediastinum/Nodes: Mildly prominent mediastinal and bilateral hilar lymph nodes, likely reactive and unchanged since prior study. No axillary adenopathy. Trachea and esophagus are unremarkable. Thyroid  unremarkable. Lungs/Pleura: Severe interstitial thickening and honeycombing peripherally in the lungs compatible with fibrosis, unchanged since prior study. No acute confluent opacities, effusions or pneumothorax. Upper Abdomen: No acute findings Musculoskeletal: Chest wall soft tissues are unremarkable. No acute bony abnormality. Review of the MIP images confirms the above findings. IMPRESSION: No evidence of pulmonary embolus. Severe fibrotic interstitial lung disease, stable since prior study. No acute cardiopulmonary disease. Aortic Atherosclerosis (ICD10-I70.0). Electronically Signed   By: Janeece Mechanic M.D.   On: 11/02/2023 19:21   DG Chest 2 View Result Date: 11/02/2023 CLINICAL DATA:  chest pain, SOB EXAM: CHEST - 2 VIEW COMPARISON:  None available. FINDINGS: Biapical pleural thickening. Fairly diffuse reticulation, predominantly in a subpleural, peripheral and  bibasilar orientation. No lobar consolidation, pleural effusion, or pneumothorax. Unchanged region of subpleural nodularity in the lateral right mid lung, likely scarring along the fissure. No cardiomegaly. Aortic atherosclerosis. No acute fracture or destructive lesions. Multilevel thoracic osteophytosis. IMPRESSION: Redemonstrated findings consistent with patient's known interstitial lung disease. Otherwise, no acute cardiopulmonary abnormality. Electronically Signed   By: Rance Burrows M.D.   On: 11/02/2023 17:38   EKG: Independently reviewed.  Sinus rhythm, rate 92, QTc 413, no significant change from prior.  Assessment/Plan Principal Problem:   Acute hypoxic respiratory failure (HCC) Active Problems:   Hypertension, essential   IPF (idiopathic pulmonary fibrosis) (HCC)   Acquired hypothyroidism   Depressive disorder   Assessment and Plan: * Acute hypoxic respiratory failure (HCC) O2 sats down to 75% on room air, currently on 2 L sats 98%.  Likely secondary to her chronic idiopathic pulmonary fibrosis.  CTA chest negative for PE, shows severe fibrotic interstitial lung disease.  Troponin 3 > 5.  BNP 17.  EKG unchanged. -Check respiratory virus panel -Trial of Steroids-  IV Solu-Medrol  60 twice daily - Albuterol  nebs as needed, mucolytics as needed -Will need home O2 on discharge - CBG BID while on steroids  Depressive disorder Resume Xanax 0.25 mg daily as needed  Acquired hypothyroidism Resume Synthroid   IPF (idiopathic pulmonary fibrosis) (HCC) Follows with pulmonology. - Resume pirfenidone   Hypertension, essential Blood pressure elevated up to 190s. -As needed labetalol 10 mg for systolic greater than 180 -Hold losartan 50mg  for contrast exposure   DVT prophylaxis: Lovenox Code Status: FULL code Family Communication: None at bedside Disposition Plan: ~ 2 days Consults called: None  Admission status:  Obs tele     Author: Pati Bonine, MD 11/02/2023 9:45  PM  For on call review [email or web address].

## 2023-11-02 NOTE — Assessment & Plan Note (Addendum)
 Blood pressure elevated up to 190s. -As needed labetalol 10 mg for systolic greater than 180 -Hold losartan 50mg  for contrast exposure

## 2023-11-02 NOTE — Assessment & Plan Note (Addendum)
 Follows with pulmonology. - Resume pirfenidone 

## 2023-11-02 NOTE — ED Triage Notes (Signed)
 Pt arrived via POV from home after being unable to obtain an appointment with her pulmonologist today. Pt reports she feels like her hypoxic episodes are directly related to her anxiety, and due to her husband recently being diagnosed with Alzheimer's disease, she has been having increased stress. Pt reports taking Xanax at home w/o relief.

## 2023-11-02 NOTE — Telephone Encounter (Signed)
 E2C2 Pulmonary Triage - Initial Assessment Questions "Chief Complaint (e.g., cough, sob, wheezing, fever, chills, sweat or additional symptoms) *Go to specific symptom protocol after initial questions. Patient with hx of idiopathic pulmonary fibrosis calling with increased symptoms of shortness of breath, generalized weakness, activity intolerance, and low oxygen readings with activity. Patient states shortness of breath had been going on for a couple of weeks but patient thought her anxiety levels were to blame for some of it. Patient did call yesterday with similar symptoms and opted to try to wait for her appointment in June. Patient's pulse oximeter readings today are 79%-85% on room air with any activity. Patient states pulse oximeter readings will go back up with rest-93-94%. Discussed with patient the need to be evaluated in the ED. Per protocol, patient is recommended to be see in ED. Patient is agreeable to ED evaluation. Patient verbalized understanding and all questions answered.   "How long have symptoms been present?" Shortness of breath have been increasing for the last couple of days  Have you tested for COVID or Flu? Note: If not, ask patient if a home test can be taken. If so, instruct patient to call back for positive results. No  MEDICINES:   "Have you used any OTC meds to help with symptoms?" No If yes, ask "What medications?"  "Have you used your inhalers/maintenance medication?" No If yes, "What medications?"   If inhaler, ask "How many puffs and how often?" Note: Review instructions on medication in the chart.   OXYGEN: "Do you wear supplemental oxygen?" No If yes, "How many liters are you supposed to use?"   "Do you monitor your oxygen levels?" Yes If yes, "What is your reading (oxygen level) today?" Pulse ox reading today have been 79% at the lowest, 83-85% with any movement. Patient states she would recover with rest but still very short of breath  "What is  your usual oxygen saturation reading?"  (Note: Pulmonary O2 sats should be 90% or greater) 93-94%   Copied from CRM [id number]. Topic: Clinical - Red Word Triage >> Nov 02, 2023  2:44 PM Ilene Malling wrote: Red Word that prompted transfer to Nurse Triage: Patient [phone] states feels worse today, breathing issues a lot of shortness of breath, light headed, very weak, and oxygen level drops down to 79-80s. Patient states she cannot do daily tasks or walk around. Patient denies pain, nor fever. Patient spoke with [facility] yesterday was offered an appointment, patient declined at the time. However, patient doesn't think she'll make it until office visit 11/30/23 to get on oxygen. Please advise. Reason for Disposition  [1] MODERATE difficulty breathing (e.g., speaks in phrases, SOB even at rest, pulse 100-120) AND [2] NEW-onset or WORSE than normal  Answer Assessment - Initial Assessment Questions 1. RESPIRATORY STATUS: "Describe your breathing?" (e.g., wheezing, shortness of breath, unable to speak, severe coughing)      Shortness of breath 2. ONSET: "When did this breathing problem begin?"      Shortness of breath started to increased over the last couple of days but initially started a couple of weeks ago.  3. PATTERN "Does the difficult breathing come and go, or has it been constant since it started?"      constant 4. SEVERITY: "How bad is your breathing?" (e.g., mild, moderate, severe)    - MILD: No SOB at rest, mild SOB with walking, speaks normally in sentences, can lie down, no retractions, pulse < 100.    - MODERATE: SOB at rest, SOB  with minimal exertion and prefers to sit, cannot lie down flat, speaks in phrases, mild retractions, audible wheezing, pulse 100-120.    - SEVERE: Very SOB at rest, speaks in single words, struggling to breathe, sitting hunched forward, retractions, pulse > 120      Moderate to severe 5. RECURRENT SYMPTOM: "Have you had difficulty breathing before?" If Yes, ask:  "When was the last time?" and "What happened that time?"      yes 6. CARDIAC HISTORY: "Do you have any history of heart disease?" (e.g., heart attack, angina, bypass surgery, angioplasty)       7. LUNG HISTORY: "Do you have any history of lung disease?"  (e.g., pulmonary embolus, asthma, emphysema)     IPF 8. CAUSE: "What do you think is causing the breathing problem?"      unsure 9. OTHER SYMPTOMS: "Do you have any other symptoms? (e.g., dizziness, runny nose, cough, chest pain, fever)     Generalized weakness, activity intolerance  Protocols used: Breathing Difficulty-A-AH

## 2023-11-02 NOTE — Telephone Encounter (Signed)
 Will monitor for ED arrival.

## 2023-11-02 NOTE — Assessment & Plan Note (Addendum)
 O2 sats down to 75% on room air, currently on 2 L sats 98%.  Likely secondary to her chronic idiopathic pulmonary fibrosis.  CTA chest negative for PE, shows severe fibrotic interstitial lung disease.  Troponin 3 > 5.  BNP 17.  EKG unchanged. -Check respiratory virus panel -Trial of Steroids-  IV Solu-Medrol  60 twice daily - Albuterol  nebs as needed, mucolytics as needed -Will need home O2 on discharge - CBG BID while on steroids

## 2023-11-02 NOTE — Assessment & Plan Note (Signed)
 Resume Xanax 0.25 mg daily as needed

## 2023-11-02 NOTE — Assessment & Plan Note (Signed)
Resume Synthroid ?

## 2023-11-02 NOTE — ED Provider Notes (Signed)
 [place] EMERGENCY DEPARTMENT AT [facility] Provider Note   CSN: [id number] Arrival date & time: 11/02/23  1634     History  Chief Complaint  Patient presents with   Shortness of Breath    Debra Jimenez is a 73 y.o. female with PMH as listed below who presents from home after being unable to obtain an appointment with her pulmonologist today. Pt reports dyspnea with exertion for approximately one week that worsened today. Today in the shower she became very dyspneic and lightheaded, was "panting," and checked her oxygen when she got up and found it to be 71%.  2 additional times a day when she was walking around her oxygen dropped into the low 80s%.  No history of similar.  Does have a diagnosis of idiopathic pulmonary fibrosis and follows with a pulmonologist but has never worn oxygen at home.  She is supposed to have a CT scan later this month as well as PFTs for further workup and management.  Patient wonders if her symptoms could be due to anxiety, and her husband was recently diagnosed with Alzheimer's disease so she states she is undergoing increased stress. Pt reports taking Xanax  at home w/o relief.   He denies any fever/chills, increased cough or sputum production, nausea vomiting diarrhea, abdominal pain, chest pain, leg swelling, history of DVT or PE, hormone use, recent travel/hospitalization/surgeries.   Past Medical History:  Diagnosis Date   Anxiety    Hypothyroidism    Idiopathic pulmonary fibrosis (HCC)    OA (osteoarthritis)    RIGHT HIP AND LEFT HIP   Wears glasses        Home Medications Prior to Admission medications   Medication Sig Start Date End Date Taking? Authorizing Provider  albuterol  (VENTOLIN  HFA) 108 (90 Base) MCG/ACT inhaler Inhale into the lungs every 6 (six) hours as needed for wheezing or shortness of breath.    [provider]  ALPRAZolam  (XANAX ) 0.25 MG tablet Take 0.25 mg by mouth daily as needed for anxiety or sleep.  11/05/19   [provider]  dronabinol  (MARINOL ) 2.5 MG capsule Take 1 capsule (2.5 mg total) by mouth 2 (two) times daily before a meal. 09/07/23   Cobb, Mariah Shines, NP  ezetimibe (ZETIA) 10 MG tablet Take 10 mg by mouth daily. 11/26/22   [provider]  levothyroxine  (SYNTHROID ) 137 MCG tablet Take 137 mcg by mouth every morning. 08/29/23   [provider]  losartan (COZAAR) 50 MG tablet Take 50 mg by mouth daily. 12/07/19   [provider]  ondansetron  (ZOFRAN ) 4 MG tablet TAKE 1 TABLET BY MOUTH TWICE DAILY AS NEEDED FOR NAUSEA AND VOMITING 10/02/23   Cobb, Mariah Shines, NP  Pirfenidone  (ESBRIET ) 801 MG TABS Take 1 tablet (801 mg total) by mouth 3 (three) times daily with meals. 07/14/23   Lind Repine, MD  zolpidem  (AMBIEN ) 5 MG tablet Take 5 mg by mouth at bedtime as needed. 03/16/21   [provider]      Allergies    Atorvastatin and Doxycycline    Review of Systems   Review of Systems A 10 point review of systems was performed and is negative unless otherwise reported in HPI.  Physical Exam Updated Vital Signs BP (!) 189/126   Pulse 92   Temp 97.9 F (36.6 C)   Resp (!) 24   Ht 5' 5.5" (1.664 m)   Wt 51.4 kg   SpO2 90%   BMI 18.57 kg/m  Physical Exam  General: Normal appearing female, sitting in bed.  HEENT: PERRLA, Sclera anicteric, MMM, trachea midline.  Cardiology: RRR, no murmurs/rubs/gallops.Aaron Aas  Resp: Mild tachypnea with no respiratory distress.  CTAB, no wheezes, rhonchi, crackles.  Abd: Soft, non-tender, non-distended. No rebound tenderness or guarding.  GU: Deferred. MSK: No peripheral edema or signs of trauma. Extremities without deformity or TTP. No cyanosis or clubbing. Skin: warm, dry.  Neuro: A&Ox4, CNs II-XII grossly intact. MAEs. Sensation grossly intact.  Psych: Normal mood and affect.   ED Results / Procedures / Treatments   Labs (all labs ordered are listed, but only abnormal results are displayed) Labs  Reviewed  CBC WITH DIFFERENTIAL/PLATELET  BLOOD GAS, VENOUS  COMPREHENSIVE METABOLIC PANEL WITH GFR  BRAIN NATRIURETIC PEPTIDE  TROPONIN I (HIGH SENSITIVITY)    EKG EKG Interpretation Date/Time:  Thursday Nov 02 2023 17:00:43 EDT Ventricular Rate:  92 PR Interval:  136 QRS Duration:  98 QT Interval:  334 QTC Calculation: 413 R Axis:   -37  Text Interpretation: Normal sinus rhythm Left axis deviation Left ventricular hypertrophy with repolarization abnormality ( R in aVL , Cornell product , Romhilt-Estes ) Cannot rule out Septal infarct , age undetermined Abnormal ECG No previous ECGs available Confirmed by Florette Hurry [phone]) on 11/03/2023 11:45:24 PM  Radiology DG Chest 2 View Result Date: 11/02/2023 CLINICAL DATA:  chest pain, SOB EXAM: CHEST - 2 VIEW COMPARISON:  None available. FINDINGS: Biapical pleural thickening. Fairly diffuse reticulation, predominantly in a subpleural, peripheral and bibasilar orientation. No lobar consolidation, pleural effusion, or pneumothorax. Unchanged region of subpleural nodularity in the lateral right mid lung, likely scarring along the fissure. No cardiomegaly. Aortic atherosclerosis. No acute fracture or destructive lesions. Multilevel thoracic osteophytosis. IMPRESSION: Redemonstrated findings consistent with patient's known interstitial lung disease. Otherwise, no acute cardiopulmonary abnormality. Electronically Signed   By: Rance Burrows M.D.   On: 11/02/2023 17:38    Procedures Procedures    Medications Ordered in ED Medications  iohexol  (OMNIPAQUE ) 350 MG/ML injection 75 mL (75 mLs Intravenous Contrast Given 11/02/23 1853)    ED Course/ Medical Decision Making/ A&P                          Medical Decision Making Amount and/or Complexity of Data Reviewed Labs: ordered. Decision-making details documented in ED Course. Radiology: ordered. Decision-making details documented in ED Course.  Risk Prescription drug management. Decision  regarding hospitalization.    This patient presents to the ED for concern of dyspnea on exertion, this involves an extensive number of treatment options, and is a complaint that carries with it a high risk of complications and morbidity.  I considered the following differential and admission for this acute, potentially life threatening condition. Patient is currently satting 90 to 92% on room air at rest.  MDM:    DDX for dyspnea includes but is not limited to:  She has no fevers or chills or increased cough but consider pneumonia.  Chest x-ray does not show any focal consolidation but it is on a background of known interstitial lung disease.  Patient is already scheduled for a CT scan later this month and will obtain it today to evaluate for possible worsening of fibrosis, superimposed pneumonia, or pulmonary embolism.  She does not have any evidence of DVT on exam and is low risk for pulmonary embolism.  She does not have any chest pain or arrhythmia to indicate ACS, no increased lower extremity edema or orthopnea to indicate  pulmonary edema or heart failure exacerbation, no wheezing to indicate COPD.  Had a long discussion with the patient and her husband at bedside about the possibility of initiating home oxygen, as this is what they are wondering.  Discussed evaluating for reversible causes of her hypoxia today and ruling out other things, however if it is a worsening of her ILD she may need to be initiated on home oxygen.  She does not have any oxygen at home right now would need to be admitted to the hospital.     Clinical Course as of 11/14/23 1202  Thu Nov 02, 2023  1744 DG Chest 2 View Redemonstrated findings consistent with patient's known interstitial lung disease. Otherwise, no acute cardiopulmonary abnormality.   [HN]  2001 Desatted on RA to 72% with just walking to bathroom. On 2L [place] at 92%.  [HN]  2001 CT Angio Chest PE W and/or Wo Contrast No evidence of pulmonary  embolus.  Severe fibrotic interstitial lung disease, stable since prior study.  No acute cardiopulmonary disease.  Aortic Atherosclerosis (ICD10-I70.0).   [HN]  2034 Troponin I (High Sensitivity): 5 Neg x2 [HN]    Clinical Course User Index [HN] Merdis Stalling, MD    Labs: I Ordered, and personally interpreted labs.  The pertinent results include: Those listed above  Imaging Studies ordered: Chest x-ray ordered from triage.  I ordered imaging studies including CT PE I independently visualized and interpreted imaging. I agree with the radiologist interpretation  Additional history obtained from chart review, husband at bedside.    Cardiac Monitoring: The patient was maintained on a cardiac monitor.  I personally viewed and interpreted the cardiac monitored which showed an underlying rhythm of: NSR  Reevaluation: After the interventions noted above, I reevaluated the patient and found that they have :stayed the same  Social Determinants of Health: Lives independently  Disposition: Admit  Co morbidities that complicate the patient evaluation  Past Medical History:  Diagnosis Date   Anxiety    Hypothyroidism    Idiopathic pulmonary fibrosis (HCC)    OA (osteoarthritis)    RIGHT HIP AND LEFT HIP   Wears glasses      Medicines No orders of the defined types were placed in this encounter.   I have reviewed the patients home medicines and have made adjustments as needed  Problem List / ED Course: Problem List Items Addressed This Visit       Respiratory   IPF (idiopathic pulmonary fibrosis) (HCC)   Follows with pulmonology. - Resume pirfenidone       Relevant Orders   For home use only DME Nebulizer machine   Chronic respiratory failure with hypoxia (HCC)   Relevant Orders   For home use only DME Nebulizer machine   * (Principal) Acute hypoxic respiratory failure (HCC) - Primary   O2 sats down to 75% on room air, currently on 2 L sats 98%.  Likely  secondary to her chronic idiopathic pulmonary fibrosis.  CTA chest negative for PE, shows severe fibrotic interstitial lung disease.  Troponin 3 > 5.  BNP 17.  EKG unchanged. -Check respiratory virus panel -Trial of Steroids-  IV Solu-Medrol  60 twice daily - Albuterol  nebs as needed, mucolytics as needed -Will need home O2 on discharge - CBG BID while on steroids      Acute respiratory failure with hypoxia (HCC)   Relevant Orders   For home use only DME Nebulizer machine   COPD (chronic obstructive pulmonary disease) (HCC)   Relevant Medications  albuterol  (PROVENTIL ) (2.5 MG/3ML) 0.083% nebulizer solution   predniSONE  (DELTASONE ) 20 MG tablet   albuterol  (VENTOLIN  HFA) 108 (90 Base) MCG/ACT inhaler   guaiFENesin  (MUCINEX ) 600 MG 12 hr tablet   Other Relevant Orders   For home use only DME Nebulizer machine                This note was created using dictation software, which may contain spelling or grammatical errors.    Merdis Stalling, MD 11/14/23 [phone]

## 2023-11-03 ENCOUNTER — Encounter (HOSPITAL_COMMUNITY): Payer: Self-pay | Admitting: Internal Medicine

## 2023-11-03 DIAGNOSIS — I700 Atherosclerosis of aorta: Secondary | ICD-10-CM | POA: Diagnosis present

## 2023-11-03 DIAGNOSIS — G4700 Insomnia, unspecified: Secondary | ICD-10-CM | POA: Diagnosis present

## 2023-11-03 DIAGNOSIS — J9621 Acute and chronic respiratory failure with hypoxia: Secondary | ICD-10-CM | POA: Diagnosis present

## 2023-11-03 DIAGNOSIS — Z96641 Presence of right artificial hip joint: Secondary | ICD-10-CM | POA: Diagnosis present

## 2023-11-03 DIAGNOSIS — J449 Chronic obstructive pulmonary disease, unspecified: Secondary | ICD-10-CM | POA: Diagnosis present

## 2023-11-03 DIAGNOSIS — E039 Hypothyroidism, unspecified: Secondary | ICD-10-CM | POA: Diagnosis present

## 2023-11-03 DIAGNOSIS — Z1152 Encounter for screening for COVID-19: Secondary | ICD-10-CM | POA: Diagnosis not present

## 2023-11-03 DIAGNOSIS — Z888 Allergy status to other drugs, medicaments and biological substances status: Secondary | ICD-10-CM | POA: Diagnosis not present

## 2023-11-03 DIAGNOSIS — Z79899 Other long term (current) drug therapy: Secondary | ICD-10-CM | POA: Diagnosis not present

## 2023-11-03 DIAGNOSIS — F32A Depression, unspecified: Secondary | ICD-10-CM | POA: Diagnosis present

## 2023-11-03 DIAGNOSIS — F419 Anxiety disorder, unspecified: Secondary | ICD-10-CM | POA: Diagnosis present

## 2023-11-03 DIAGNOSIS — J9601 Acute respiratory failure with hypoxia: Secondary | ICD-10-CM | POA: Diagnosis not present

## 2023-11-03 DIAGNOSIS — Z881 Allergy status to other antibiotic agents status: Secondary | ICD-10-CM | POA: Diagnosis not present

## 2023-11-03 DIAGNOSIS — Z79890 Hormone replacement therapy (postmenopausal): Secondary | ICD-10-CM | POA: Diagnosis not present

## 2023-11-03 DIAGNOSIS — I10 Essential (primary) hypertension: Secondary | ICD-10-CM | POA: Diagnosis present

## 2023-11-03 DIAGNOSIS — J84112 Idiopathic pulmonary fibrosis: Secondary | ICD-10-CM | POA: Diagnosis present

## 2023-11-03 DIAGNOSIS — Z87891 Personal history of nicotine dependence: Secondary | ICD-10-CM | POA: Diagnosis not present

## 2023-11-03 LAB — GLUCOSE, CAPILLARY
Glucose-Capillary: 179 mg/dL — ABNORMAL HIGH (ref 70–99)
Glucose-Capillary: 148 mg/dL — ABNORMAL HIGH (ref 70–99)

## 2023-11-03 MED ORDER — TEMAZEPAM 15 MG PO CAPS
15.0000 mg | ORAL_CAPSULE | Freq: Every day | ORAL | Status: AC
  Administered 2023-11-03 – 2023-11-04 (×2): 15 mg via ORAL
  Filled 2023-11-03 (×2): qty 1

## 2023-11-03 MED ORDER — IPRATROPIUM-ALBUTEROL 0.5-2.5 (3) MG/3ML IN SOLN
3.0000 mL | Freq: Four times a day (QID) | RESPIRATORY_TRACT | Status: DC
  Administered 2023-11-03 – 2023-11-04 (×4): 3 mL via RESPIRATORY_TRACT
  Filled 2023-11-03 (×4): qty 3

## 2023-11-03 MED ORDER — DM-GUAIFENESIN ER 30-600 MG PO TB12
1.0000 | ORAL_TABLET | Freq: Two times a day (BID) | ORAL | Status: DC
  Administered 2023-11-03 – 2023-11-07 (×9): 1 via ORAL
  Filled 2023-11-03 (×9): qty 1

## 2023-11-03 MED ORDER — INSULIN ASPART 100 UNIT/ML IJ SOLN
0.0000 [IU] | Freq: Three times a day (TID) | INTRAMUSCULAR | Status: DC
  Administered 2023-11-04 (×2): 2 [IU] via SUBCUTANEOUS
  Administered 2023-11-05: 3 [IU] via SUBCUTANEOUS
  Administered 2023-11-05: 1 [IU] via SUBCUTANEOUS
  Administered 2023-11-05: 2 [IU] via SUBCUTANEOUS
  Administered 2023-11-06: 1 [IU] via SUBCUTANEOUS
  Administered 2023-11-06 (×2): 2 [IU] via SUBCUTANEOUS
  Administered 2023-11-07: 1 [IU] via SUBCUTANEOUS
  Administered 2023-11-07: 5 [IU] via SUBCUTANEOUS

## 2023-11-03 MED ORDER — AMLODIPINE BESYLATE 5 MG PO TABS
5.0000 mg | ORAL_TABLET | Freq: Every day | ORAL | Status: DC
  Administered 2023-11-03 – 2023-11-07 (×5): 5 mg via ORAL
  Filled 2023-11-03 (×5): qty 1

## 2023-11-03 MED ORDER — INSULIN ASPART 100 UNIT/ML IJ SOLN: 0.0000 [IU] | Freq: Every day | INTRAMUSCULAR | Status: DC

## 2023-11-03 NOTE — Plan of Care (Signed)

## 2023-11-03 NOTE — TOC CM/SW Note (Signed)
 Transition of Care [facility]) - Inpatient Brief Assessment   Patient Details  Name: Debra Jimenez MRN: [id number] Date of Birth: Mar 22, 1951  Transition of Care [facility]) CM/SW Contact:    Cyndie Dredge, LCSWA Phone Number: 11/03/2023, 7:07 AM   Clinical Narrative:  Transition of Care Department [facility]) has reviewed patient and no TOC needs have been identified at this time. We will continue to monitor patient advancement through interdisciplinary progression rounds. If new patient transition needs arise, please place a TOC consult.  Transition of Care Asessment: Insurance and Status: Insurance coverage has been reviewed Patient has primary care physician: Yes Home environment has been reviewed: [facility] Prior level of function:: Independent Prior/Current Home Services: No current home services Social Drivers of Health Review: SDOH reviewed no interventions necessary Readmission risk has been reviewed: Yes Transition of care needs: no transition of care needs at this time

## 2023-11-03 NOTE — Telephone Encounter (Signed)
 FYI patient admitted for Acute Hypoxic Resp Failure.   Thanks!

## 2023-11-03 NOTE — Plan of Care (Signed)

## 2023-11-03 NOTE — Progress Notes (Signed)
 PROGRESS NOTE  Debra Jimenez, is a 73 y.o. female, DOB - Oct 06, 1950, GNF:[id number]  Admit date - 11/02/2023   Admitting Physician Sharan Mcenaney Quintella Buck, MD  Outpatient Primary MD for the patient is Skillman, Katherine E, PA-C  LOS - 0  Chief Complaint  Patient presents with   Shortness of Breath      Brief Narrative:  73 y.o. female with medical history significant for idiopathic pulmonary fibrosis, hypertension, depression, hypothyroidism admitted on 11/02/2023 with acute hypoxic respiratory failure secondary to IPF flareup    -Assessment and Plan: 1)Acute hypoxic respiratory failure --due to IPF flareup - CTA chest negative for PE, shows severe fibrotic interstitial lung disease.  Troponin 3 > 5.  BNP 17.  EKG unchanged. -Continue IV Solu-Medrol , mucolytics and bronchodilators - Requiring 2 to 3 L of oxygen via nasal cannula at rest  2)Insomnia/anxiety disorder--- Restoril nightly, Xanax as needed -Hold PTA Ambien   3)Acquired hypothyroidism -Continue levothyroxine   4)IPF (idiopathic pulmonary fibrosis) (HCC) Follows with pulmonologist Dr. Celene Coins - c/n  pirfenidone  - Continue IV Solu-Medrol  for IPF flareup as above #1  5)HTN- -start amlodipine -Hold losartan 50mg  for contrast exposure - May use IV labetalol as needed elevated BP  Status is: Inpatient   Disposition: The patient is from: Home              Anticipated d/c is to: Home              Anticipated d/c date is: 2 days              Patient currently is not medically stable to d/c. Barriers: Not Clinically Stable-   Code Status :  -  Code Status: Full Code   Family Communication:    NA (patient is alert, awake and coherent)   DVT Prophylaxis  :   - SCDs  enoxaparin (LOVENOX) injection 40 mg Start: 11/03/23 1000   Lab Results  Component Value Date   PLT 201 11/02/2023    Inpatient Medications  Scheduled Meds:  dextromethorphan-guaiFENesin  1 tablet Oral BID   enoxaparin (LOVENOX) injection  40 mg  Subcutaneous Q24H   ipratropium-albuterol   3 mL Nebulization Q6H   levothyroxine   137 mcg Oral Q0600   methylPREDNISolone  (SOLU-MEDROL ) injection  60 mg Intravenous Q12H   Pirfenidone   801 mg Oral TID WC   temazepam  15 mg Oral QHS   Continuous Infusions: PRN Meds:.acetaminophen  **OR** acetaminophen , albuterol , ALPRAZolam, labetalol, ondansetron  **OR** ondansetron  (ZOFRAN ) IV, polyethylene glycol   Anti-infectives (From admission, onward)    None         Subjective: Debra Jimenez today has no fevers, no emesis,  No chest pain,   - Cough, dyspnea and hypoxia persist -Fatigue and significant dyspnea on exertion persist   Objective: Vitals:   11/03/23 0152 11/03/23 0455 11/03/23 1330 11/03/23 1345  BP: (!) 150/90 (!) 140/93 (!) 154/90   Pulse: 82 87 80   Resp: (!) 24 (!) 24    Temp: (!) 97.4 F (36.3 C) 97.7 F (36.5 C) 98.6 F (37 C)   TempSrc: Oral Oral    SpO2: 98% 97% 100% 99%  Weight:      Height:        Intake/Output Summary (Last 24 hours) at 11/03/2023 1724 Last data filed at 11/03/2023 1300 Gross per 24 hour  Intake 720 ml  Output --  Net 720 ml   Filed Weights   11/02/23 1654 11/02/23 2214  Weight: 51.4 kg 49.1 kg    Physical Exam  Gen:- Awake Alert, exertional dyspnea HEENT:- [place].AT, No sclera icterus Nose- [place] 2L/min Neck-Supple Neck,No JVD,.  Lungs-diminished sounds with Velcro type rales bilaterally CV- S1, S2 normal, regular  Abd-  +ve B.Sounds, Abd Soft, No tenderness,    Extremity/Skin:- No  edema, pedal pulses present  Psych-affect is appropriate, oriented x3 Neuro-no new focal deficits, no tremors  Data Reviewed: I have personally reviewed following labs and imaging studies  CBC: Recent Labs  Lab 11/02/23 1758  WBC 7.4  NEUTROABS 5.6  HGB 13.6  HCT 42.8  MCV 94.3  PLT 201   Basic Metabolic Panel: Recent Labs  Lab 11/02/23 1758  NA 137  K 3.8  CL 99  CO2 29  GLUCOSE 126*  BUN 22  CREATININE 0.72  CALCIUM 9.9    GFR: Estimated Creatinine Clearance: 48.5 mL/min (by C-G formula based on SCr of 0.72 mg/dL). Liver Function Tests: Recent Labs  Lab 11/02/23 1758  AST 16  ALT 10  ALKPHOS 71  BILITOT 1.0  PROT 7.6  ALBUMIN 3.9    Recent Results (from the past 240 hours)  Resp panel by RT-PCR (RSV, Flu A&B, Covid) Anterior Nasal Swab     Status: None   Collection Time: 11/02/23  9:38 PM   Specimen: Anterior Nasal Swab  Result Value Ref Range Status   SARS Coronavirus 2 by RT PCR NEGATIVE NEGATIVE Final    Comment: (NOTE) SARS-CoV-2 target nucleic acids are NOT DETECTED.  The SARS-CoV-2 RNA is generally detectable in upper respiratory specimens during the acute phase of infection. The lowest concentration of SARS-CoV-2 viral copies this assay can detect is 138 copies/mL. A negative result does not preclude SARS-Cov-2 infection and should not be used as the sole basis for treatment or other patient management decisions. A negative result may occur with  improper specimen collection/handling, submission of specimen other than nasopharyngeal swab, presence of viral mutation(s) within the areas targeted by this assay, and inadequate number of viral copies(<138 copies/mL). A negative result must be combined with clinical observations, patient history, and epidemiological information. The expected result is Negative.  Fact Sheet for Patients:  BloggerCourse.com  Fact Sheet for Healthcare Providers:  SeriousBroker.it  This test is no t yet approved or cleared by the [place]  FDA and  has been authorized for detection and/or diagnosis of SARS-CoV-2 by FDA under an Emergency Use Authorization (EUA). This EUA will remain  in effect (meaning this test can be used) for the duration of the COVID-19 declaration under Section 564(b)(1) of the Act, 21 U.S.C.section 360bbb-3(b)(1), unless the authorization is terminated  or revoked sooner.        Influenza A by PCR NEGATIVE NEGATIVE Final   Influenza B by PCR NEGATIVE NEGATIVE Final    Comment: (NOTE) The Xpert Xpress SARS-CoV-2/FLU/RSV plus assay is intended as an aid in the diagnosis of influenza from Nasopharyngeal swab specimens and should not be used as a sole basis for treatment. Nasal washings and aspirates are unacceptable for Xpert Xpress SARS-CoV-2/FLU/RSV testing.  Fact Sheet for Patients: BloggerCourse.com  Fact Sheet for Healthcare Providers: SeriousBroker.it  This test is not yet approved or cleared by the [place]  FDA and has been authorized for detection and/or diagnosis of SARS-CoV-2 by FDA under an Emergency Use Authorization (EUA). This EUA will remain in effect (meaning this test can be used) for the duration of the COVID-19 declaration under Section 564(b)(1) of the Act, 21 U.S.C. section 360bbb-3(b)(1), unless the authorization is terminated or revoked.  Resp Syncytial Virus by PCR NEGATIVE NEGATIVE Final    Comment: (NOTE) Fact Sheet for Patients: BloggerCourse.com  Fact Sheet for Healthcare Providers: SeriousBroker.it  This test is not yet approved or cleared by the [place]  FDA and has been authorized for detection and/or diagnosis of SARS-CoV-2 by FDA under an Emergency Use Authorization (EUA). This EUA will remain in effect (meaning this test can be used) for the duration of the COVID-19 declaration under Section 564(b)(1) of the Act, 21 U.S.C. section 360bbb-3(b)(1), unless the authorization is terminated or revoked.  Performed at [facility], [address] [place], [place] [address]     Radiology Studies: CT Angio Chest PE W and/or Wo Contrast Result Date: 11/02/2023 CLINICAL DATA:  Pulmonary embolism (PE) suspected, high prob. Shortness of breath EXAM: CT ANGIOGRAPHY CHEST WITH CONTRAST TECHNIQUE: Multidetector CT  imaging of the chest was performed using the standard protocol during bolus administration of intravenous contrast. Multiplanar CT image reconstructions and MIPs were obtained to evaluate the vascular anatomy. RADIATION DOSE REDUCTION: This exam was performed according to the departmental dose-optimization program which includes automated exposure control, adjustment of the mA and/or kV according to patient size and/or use of iterative reconstruction technique. CONTRAST:  75mL OMNIPAQUE  IOHEXOL  350 MG/ML SOLN COMPARISON:  10/24/2022 FINDINGS: Cardiovascular: Heart is normal size. Aorta is normal caliber. Scattered aortic calcifications. No evidence of aortic dissection. No filling defects in the pulmonary arteries to suggest pulmonary emboli. Mediastinum/Nodes: Mildly prominent mediastinal and bilateral hilar lymph nodes, likely reactive and unchanged since prior study. No axillary adenopathy. Trachea and esophagus are unremarkable. Thyroid  unremarkable. Lungs/Pleura: Severe interstitial thickening and honeycombing peripherally in the lungs compatible with fibrosis, unchanged since prior study. No acute confluent opacities, effusions or pneumothorax. Upper Abdomen: No acute findings Musculoskeletal: Chest wall soft tissues are unremarkable. No acute bony abnormality. Review of the MIP images confirms the above findings. IMPRESSION: No evidence of pulmonary embolus. Severe fibrotic interstitial lung disease, stable since prior study. No acute cardiopulmonary disease. Aortic Atherosclerosis (ICD10-I70.0). Electronically Signed   By: Janeece Mechanic M.D.   On: 11/02/2023 19:21   DG Chest 2 View Result Date: 11/02/2023 CLINICAL DATA:  chest pain, SOB EXAM: CHEST - 2 VIEW COMPARISON:  None available. FINDINGS: Biapical pleural thickening. Fairly diffuse reticulation, predominantly in a subpleural, peripheral and bibasilar orientation. No lobar consolidation, pleural effusion, or pneumothorax. Unchanged region of  subpleural nodularity in the lateral right mid lung, likely scarring along the fissure. No cardiomegaly. Aortic atherosclerosis. No acute fracture or destructive lesions. Multilevel thoracic osteophytosis. IMPRESSION: Redemonstrated findings consistent with patient's known interstitial lung disease. Otherwise, no acute cardiopulmonary abnormality. Electronically Signed   By: Rance Burrows M.D.   On: 11/02/2023 17:38    Scheduled Meds:  dextromethorphan-guaiFENesin  1 tablet Oral BID   enoxaparin (LOVENOX) injection  40 mg Subcutaneous Q24H   ipratropium-albuterol   3 mL Nebulization Q6H   levothyroxine   137 mcg Oral Q0600   methylPREDNISolone  (SOLU-MEDROL ) injection  60 mg Intravenous Q12H   Pirfenidone   801 mg Oral TID WC   temazepam  15 mg Oral QHS   Continuous Infusions:   LOS: 0 days    Colin Dawley M.D on 11/03/2023 at 5:24 PM  Go to [email or web address] - for contact info  [facility] - Office  [phone]  If 7PM-7AM, please contact night-coverage [email or web address] 11/03/2023, 5:24 PM

## 2023-11-04 DIAGNOSIS — J9601 Acute respiratory failure with hypoxia: Secondary | ICD-10-CM | POA: Diagnosis not present

## 2023-11-04 LAB — GLUCOSE, CAPILLARY
Glucose-Capillary: 148 mg/dL — ABNORMAL HIGH (ref 70–99)
Glucose-Capillary: 215 mg/dL — ABNORMAL HIGH (ref 70–99)
Glucose-Capillary: 205 mg/dL — ABNORMAL HIGH (ref 70–99)
Glucose-Capillary: 161 mg/dL — ABNORMAL HIGH (ref 70–99)

## 2023-11-04 LAB — HEMOGLOBIN A1C
Hgb A1c MFr Bld: 5.6 % (ref 4.8–5.6)
Mean Plasma Glucose: 114.02 mg/dL

## 2023-11-04 MED ORDER — IPRATROPIUM-ALBUTEROL 0.5-2.5 (3) MG/3ML IN SOLN
3.0000 mL | Freq: Three times a day (TID) | RESPIRATORY_TRACT | Status: DC
  Administered 2023-11-04 – 2023-11-06 (×6): 3 mL via RESPIRATORY_TRACT
  Filled 2023-11-04 (×6): qty 3

## 2023-11-04 MED ORDER — PHENOL 1.4 % MT LIQD: 1.0000 | OROMUCOSAL | Status: DC | PRN

## 2023-11-04 MED ORDER — IPRATROPIUM-ALBUTEROL 0.5-2.5 (3) MG/3ML IN SOLN: 3.0000 mL | Freq: Four times a day (QID) | RESPIRATORY_TRACT | Status: DC

## 2023-11-04 NOTE — Progress Notes (Signed)
 PROGRESS NOTE  Debra Jimenez, is a 73 y.o. female, DOB - [birth year], WJX:[id number]  Admit date - 11/02/2023   Admitting Physician Attie Nawabi Quintella Buck, MD  Outpatient Primary MD for the patient is Skillman, Katherine E, PA-C  LOS - 1  Chief Complaint  Patient presents with   Shortness of Breath      Brief Narrative:  73 y.o. female with medical history significant for idiopathic pulmonary fibrosis, hypertension, depression, hypothyroidism admitted on 11/02/2023 with acute hypoxic respiratory failure secondary to IPF flareup    -Assessment and Plan: 1)Acute hypoxic respiratory failure --due to IPF flareup - CTA chest negative for PE, shows severe fibrotic interstitial lung disease.  Troponin 3 > 5.  BNP 17.  EKG unchanged. -Continue IV Solu-Medrol , mucolytics and bronchodilators 11/04/23 Cough and wheezing improving - Weaned down to 2 L of oxygen via nasal cannula at this time  2)Insomnia/anxiety disorder--- Restoril  nightly,   -Hold PTA Ambien   3)Acquired hypothyroidism -Continue levothyroxine   4)IPF (idiopathic pulmonary fibrosis) (HCC) Follows with pulmonologist Dr. Celene Coins - c/n  pirfenidone  - Continue IV Solu-Medrol  for IPF flareup as above #1 -Cough and wheezing improving  5)HTN- -c/n  amlodipine  -Hold losartan 50mg  for contrast exposure - May use IV labetalol  as needed elevated BP  Status is: Inpatient   Disposition: The patient is from: Home              Anticipated d/c is to: Home              Anticipated d/c date is: 1 day              Patient currently is not medically stable to d/c. Barriers: Not Clinically Stable-   Code Status :  -  Code Status: Full Code   Family Communication:    NA (patient is alert, awake and coherent)   DVT Prophylaxis  :   - SCDs  enoxaparin  (LOVENOX ) injection 40 mg Start: 11/03/23 1000   Lab Results  Component Value Date   PLT 201 11/02/2023   Inpatient Medications  Scheduled Meds:  amLODipine   5 mg Oral Daily    dextromethorphan -guaiFENesin   1 tablet Oral BID   enoxaparin  (LOVENOX ) injection  40 mg Subcutaneous Q24H   insulin  aspart  0-5 Units Subcutaneous QHS   insulin  aspart  0-6 Units Subcutaneous TID WC   ipratropium-albuterol   3 mL Nebulization TID   levothyroxine   137 mcg Oral Q0600   methylPREDNISolone  (SOLU-MEDROL ) injection  60 mg Intravenous Q12H   Pirfenidone   801 mg Oral TID WC   temazepam   15 mg Oral QHS   Continuous Infusions: PRN Meds:.acetaminophen  **OR** acetaminophen , albuterol , ALPRAZolam , labetalol , ondansetron  **OR** ondansetron  (ZOFRAN ) IV, polyethylene glycol   Anti-infectives (From admission, onward)    None      Subjective: Debra Jimenez today has no fevers, no emesis,  No chest pain,   - Ambulated with mobility specialist--- O2 sat 93% or higher on 2 L via nasal cannula - Cough and wheezing improving  Objective: Vitals:   11/04/23 0436 11/04/23 0738 11/04/23 1256 11/04/23 1347  BP: 136/84  135/83   Pulse: 75  (!) 107   Resp: 20  18   Temp: 97.8 F (36.6 C)  99.4 F (37.4 C)   TempSrc: Oral  Oral   SpO2: 99% 97% 98% 97%  Weight:      Height:        Intake/Output Summary (Last 24 hours) at 11/04/2023 1505 Last data filed at 11/04/2023 0907 Gross per 24 hour  Intake 240 ml  Output --  Net 240 ml   Filed Weights   11/02/23 1654 11/02/23 2214  Weight: 51.4 kg 49.1 kg   Physical Exam Gen:- Awake Alert, in no apparent distress  HEENT:- [place].AT, No sclera icterus Nose- [place] 2L/min Neck-Supple Neck,No JVD,.  Lungs-diminished sounds with Velcro type rales bilaterally CV- S1, S2 normal, regular  Abd-  +ve B.Sounds, Abd Soft, No tenderness,    Extremity/Skin:- No  edema, pedal pulses present  Psych-affect is appropriate, oriented x3 Neuro-no new focal deficits, no tremors  Data Reviewed: I have personally reviewed following labs and imaging studies  CBC: Recent Labs  Lab 11/02/23 1758  WBC 7.4  NEUTROABS 5.6  HGB 13.6  HCT 42.8  MCV 94.3   PLT 201   Basic Metabolic Panel: Recent Labs  Lab 11/02/23 1758  NA 137  K 3.8  CL 99  CO2 29  GLUCOSE 126*  BUN 22  CREATININE 0.72  CALCIUM 9.9   GFR: Estimated Creatinine Clearance: 48.5 mL/min (by C-G formula based on SCr of 0.72 mg/dL). Liver Function Tests: Recent Labs  Lab 11/02/23 1758  AST 16  ALT 10  ALKPHOS 71  BILITOT 1.0  PROT 7.6  ALBUMIN 3.9    Recent Results (from the past 240 hours)  Resp panel by RT-PCR (RSV, Flu A&B, Covid) Anterior Nasal Swab     Status: None   Collection Time: 11/02/23  9:38 PM   Specimen: Anterior Nasal Swab  Result Value Ref Range Status   SARS Coronavirus 2 by RT PCR NEGATIVE NEGATIVE Final    Comment: (NOTE) SARS-CoV-2 target nucleic acids are NOT DETECTED.  The SARS-CoV-2 RNA is generally detectable in upper respiratory specimens during the acute phase of infection. The lowest concentration of SARS-CoV-2 viral copies this assay can detect is 138 copies/mL. A negative result does not preclude SARS-Cov-2 infection and should not be used as the sole basis for treatment or other patient management decisions. A negative result may occur with  improper specimen collection/handling, submission of specimen other than nasopharyngeal swab, presence of viral mutation(s) within the areas targeted by this assay, and inadequate number of viral copies(<138 copies/mL). A negative result must be combined with clinical observations, patient history, and epidemiological information. The expected result is Negative.  Fact Sheet for Patients:  BloggerCourse.com  Fact Sheet for Healthcare Providers:  SeriousBroker.it  This test is no t yet approved or cleared by the [place]  FDA and  has been authorized for detection and/or diagnosis of SARS-CoV-2 by FDA under an Emergency Use Authorization (EUA). This EUA will remain  in effect (meaning this test can be used) for the duration of  the COVID-19 declaration under Section 564(b)(1) of the Act, 21 U.S.C.section 360bbb-3(b)(1), unless the authorization is terminated  or revoked sooner.       Influenza A by PCR NEGATIVE NEGATIVE Final   Influenza B by PCR NEGATIVE NEGATIVE Final    Comment: (NOTE) The Xpert Xpress SARS-CoV-2/FLU/RSV plus assay is intended as an aid in the diagnosis of influenza from Nasopharyngeal swab specimens and should not be used as a sole basis for treatment. Nasal washings and aspirates are unacceptable for Xpert Xpress SARS-CoV-2/FLU/RSV testing.  Fact Sheet for Patients: BloggerCourse.com  Fact Sheet for Healthcare Providers: SeriousBroker.it  This test is not yet approved or cleared by the [place]  FDA and has been authorized for detection and/or diagnosis of SARS-CoV-2 by FDA under an Emergency Use Authorization (EUA). This EUA will remain in effect (meaning  this test can be used) for the duration of the COVID-19 declaration under Section 564(b)(1) of the Act, 21 U.S.C. section 360bbb-3(b)(1), unless the authorization is terminated or revoked.     Resp Syncytial Virus by PCR NEGATIVE NEGATIVE Final    Comment: (NOTE) Fact Sheet for Patients: BloggerCourse.com  Fact Sheet for Healthcare Providers: SeriousBroker.it  This test is not yet approved or cleared by the [place]  FDA and has been authorized for detection and/or diagnosis of SARS-CoV-2 by FDA under an Emergency Use Authorization (EUA). This EUA will remain in effect (meaning this test can be used) for the duration of the COVID-19 declaration under Section 564(b)(1) of the Act, 21 U.S.C. section 360bbb-3(b)(1), unless the authorization is terminated or revoked.  Performed at [facility], [address], [place], [place] [address]     Radiology Studies: CT Angio Chest PE W and/or Wo Contrast Result Date:  11/02/2023 CLINICAL DATA:  Pulmonary embolism (PE) suspected, high prob. Shortness of breath EXAM: CT ANGIOGRAPHY CHEST WITH CONTRAST TECHNIQUE: Multidetector CT imaging of the chest was performed using the standard protocol during bolus administration of intravenous contrast. Multiplanar CT image reconstructions and MIPs were obtained to evaluate the vascular anatomy. RADIATION DOSE REDUCTION: This exam was performed according to the departmental dose-optimization program which includes automated exposure control, adjustment of the mA and/or kV according to patient size and/or use of iterative reconstruction technique. CONTRAST:  75mL OMNIPAQUE  IOHEXOL  350 MG/ML SOLN COMPARISON:  10/24/2022 FINDINGS: Cardiovascular: Heart is normal size. Aorta is normal caliber. Scattered aortic calcifications. No evidence of aortic dissection. No filling defects in the pulmonary arteries to suggest pulmonary emboli. Mediastinum/Nodes: Mildly prominent mediastinal and bilateral hilar lymph nodes, likely reactive and unchanged since prior study. No axillary adenopathy. Trachea and esophagus are unremarkable. Thyroid  unremarkable. Lungs/Pleura: Severe interstitial thickening and honeycombing peripherally in the lungs compatible with fibrosis, unchanged since prior study. No acute confluent opacities, effusions or pneumothorax. Upper Abdomen: No acute findings Musculoskeletal: Chest wall soft tissues are unremarkable. No acute bony abnormality. Review of the MIP images confirms the above findings. IMPRESSION: No evidence of pulmonary embolus. Severe fibrotic interstitial lung disease, stable since prior study. No acute cardiopulmonary disease. Aortic Atherosclerosis (ICD10-I70.0). Electronically Signed   By: Janeece Mechanic M.D.   On: 11/02/2023 19:21   DG Chest 2 View Result Date: 11/02/2023 CLINICAL DATA:  chest pain, SOB EXAM: CHEST - 2 VIEW COMPARISON:  None available. FINDINGS: Biapical pleural thickening. Fairly diffuse  reticulation, predominantly in a subpleural, peripheral and bibasilar orientation. No lobar consolidation, pleural effusion, or pneumothorax. Unchanged region of subpleural nodularity in the lateral right mid lung, likely scarring along the fissure. No cardiomegaly. Aortic atherosclerosis. No acute fracture or destructive lesions. Multilevel thoracic osteophytosis. IMPRESSION: Redemonstrated findings consistent with patient's known interstitial lung disease. Otherwise, no acute cardiopulmonary abnormality. Electronically Signed   By: Rance Burrows M.D.   On: 11/02/2023 17:38   Scheduled Meds:  amLODipine   5 mg Oral Daily   dextromethorphan -guaiFENesin   1 tablet Oral BID   enoxaparin  (LOVENOX ) injection  40 mg Subcutaneous Q24H   insulin  aspart  0-5 Units Subcutaneous QHS   insulin  aspart  0-6 Units Subcutaneous TID WC   ipratropium-albuterol   3 mL Nebulization TID   levothyroxine   137 mcg Oral Q0600   methylPREDNISolone  (SOLU-MEDROL ) injection  60 mg Intravenous Q12H   Pirfenidone   801 mg Oral TID WC   temazepam   15 mg Oral QHS   Continuous Infusions:   LOS: 1 day   Woodrow Drab  M.D on 11/04/2023 at 3:05 PM  Go to [email or web address] - for contact info  [facility] - Office  [phone]  If 7PM-7AM, please contact night-coverage [email or web address] 11/04/2023, 3:05 PM

## 2023-11-04 NOTE — Progress Notes (Signed)
 Mobility Specialist Progress Note:    11/04/23 1040  Mobility  Activity Ambulated with assistance in hallway  Level of Assistance Modified independent, requires aide device or extra time  Assistive Device None  Distance Ambulated (ft) 200 ft  Range of Motion/Exercises Active;All extremities  Activity Response Tolerated well  Mobility Referral Yes  Mobility visit 1 Mobility  Mobility Specialist Start Time (ACUTE ONLY) 1040  Mobility Specialist Stop Time (ACUTE ONLY) 1100  Mobility Specialist Time Calculation (min) (ACUTE ONLY) 20 min   Pt received in chair, agreeable to mobility. ModI to stand and ambulate with no AD. Tolerated well, SpO2 99% on 2.5L at rest. SpO2 93-97% on 2L during ambulation. Returned pt to chair, all needs met.   Ajla Mcgeachy Mobility Specialist Please contact via Special educational needs teacher or  Rehab office at [phone]

## 2023-11-04 NOTE — Plan of Care (Signed)

## 2023-11-05 DIAGNOSIS — J9601 Acute respiratory failure with hypoxia: Secondary | ICD-10-CM | POA: Diagnosis not present

## 2023-11-05 LAB — GLUCOSE, CAPILLARY
Glucose-Capillary: 193 mg/dL — ABNORMAL HIGH (ref 70–99)
Glucose-Capillary: 265 mg/dL — ABNORMAL HIGH (ref 70–99)
Glucose-Capillary: 247 mg/dL — ABNORMAL HIGH (ref 70–99)
Glucose-Capillary: 158 mg/dL — ABNORMAL HIGH (ref 70–99)

## 2023-11-05 MED ORDER — TEMAZEPAM 15 MG PO CAPS
15.0000 mg | ORAL_CAPSULE | Freq: Once | ORAL | Status: AC
  Administered 2023-11-05: 15 mg via ORAL
  Filled 2023-11-05: qty 1

## 2023-11-05 NOTE — Progress Notes (Signed)
 Just walked patient in hall about 250 feet. She tolerated well subjectively, she was not lightheaded said "she felt fine." But her heart rate immediately went up when up and walking, as high as 150 bpm, oxygen did good 90% of the way, in mid 90's, then towards then end of the walk got down to 87%. She came back into her room and sat down I gave 2L o2 and within 2 min heart rate was back down to 90's and oxygen back up to 99%. Pulse ox pleth looked accurate the whole time.

## 2023-11-05 NOTE — Progress Notes (Signed)
 PROGRESS NOTE  Debra Jimenez, is a 73 y.o. female, DOB - 05-24-1951, NFA:[id number]  Admit date - 11/02/2023   Admitting Physician Chauntay Paszkiewicz Quintella Buck, MD  Outpatient Primary MD for the patient is Skillman, Katherine E, PA-C  LOS - 2  Chief Complaint  Patient presents with   Shortness of Breath      Brief Narrative:  73 y.o. female with medical history significant for idiopathic pulmonary fibrosis, hypertension, depression, hypothyroidism admitted on 11/02/2023 with acute hypoxic respiratory failure secondary to IPF flareup    -Assessment and Plan: 1)Acute hypoxic respiratory failure --due to IPF flareup - CTA chest negative for PE, shows severe fibrotic interstitial lung disease.  Troponin 3 > 5.  BNP 17.  EKG unchanged. -Continue IV Solu-Medrol , mucolytics and bronchodilators 11/05/23 Cough and wheezing improving - Became dyspneic, tachycardic (HR > 150 bpm) and O2 sats dropped with attempts to ambulate without O2 - Currently back on O2 at 2 L via nasal cannula  2)Insomnia/anxiety disorder--- Restoril  nightly,   -Hold PTA Ambien   3)Acquired hypothyroidism -Continue levothyroxine   4)IPF (idiopathic pulmonary fibrosis) (HCC) Follows with pulmonologist Dr. Celene Coins - c/n  pirfenidone  - Continue IV Solu-Medrol  for IPF flareup as above #1 -Cough and wheezing improving  5)HTN- -c/n  amlodipine  -Hold losartan 50mg  for contrast exposure - May use IV labetalol  as needed elevated BP  Status is: Inpatient   Disposition: The patient is from: Home              Anticipated d/c is to: Home              Anticipated d/c date is: 1 day              Patient currently is not medically stable to d/c. Barriers: Not Clinically Stable-   Code Status :  -  Code Status: Full Code   Family Communication:    NA (patient is alert, awake and coherent)   DVT Prophylaxis  :   - SCDs  enoxaparin  (LOVENOX ) injection 40 mg Start: 11/03/23 1000   Lab Results  Component Value Date   PLT 201  11/02/2023   Inpatient Medications  Scheduled Meds:  amLODipine   5 mg Oral Daily   dextromethorphan -guaiFENesin   1 tablet Oral BID   enoxaparin  (LOVENOX ) injection  40 mg Subcutaneous Q24H   insulin  aspart  0-5 Units Subcutaneous QHS   insulin  aspart  0-6 Units Subcutaneous TID WC   ipratropium-albuterol   3 mL Nebulization TID   levothyroxine   137 mcg Oral Q0600   methylPREDNISolone  (SOLU-MEDROL ) injection  60 mg Intravenous Q12H   Pirfenidone   801 mg Oral TID WC   Continuous Infusions: PRN Meds:.acetaminophen  **OR** acetaminophen , albuterol , ALPRAZolam , labetalol , ondansetron  **OR** ondansetron  (ZOFRAN ) IV, phenol, polyethylene glycol   Anti-infectives (From admission, onward)    None      Subjective: Debra Jimenez today has no fevers, no emesis,  No chest pain,   - -Patient's brother and sister-in-law at bedside, questions answered  Cough and wheezing improving - Became dyspneic, tachycardic (HR > 150 bpm) and O2 sats dropped with attempts to ambulate without O2 - Currently back on O2 at 2 L via nasal cannula  Objective: Vitals:   11/05/23 0919 11/05/23 0935 11/05/23 1044 11/05/23 1442  BP:      Pulse:      Resp:      Temp:      TempSrc:      SpO2: 99% 91% 94% 91%  Weight:      Height:  Intake/Output Summary (Last 24 hours) at 11/05/2023 1721 Last data filed at 11/05/2023 1716 Gross per 24 hour  Intake 960 ml  Output --  Net 960 ml   Filed Weights   11/02/23 1654 11/02/23 2214  Weight: 51.4 kg 49.1 kg   Physical Exam Gen:- Awake Alert, in no apparent distress  HEENT:- [place].AT, No sclera icterus Nose- [place] 2L/min Neck-Supple Neck,No JVD,.  Lungs-diminished sounds with Velcro type rales bilaterally CV- S1, S2 normal, regular  Abd-  +ve B.Sounds, Abd Soft, No tenderness,    Extremity/Skin:- No  edema, pedal pulses present  Psych-affect is appropriate, oriented x3 Neuro-no new focal deficits, no tremors  Data Reviewed: I have personally reviewed  following labs and imaging studies  CBC: Recent Labs  Lab 11/02/23 1758  WBC 7.4  NEUTROABS 5.6  HGB 13.6  HCT 42.8  MCV 94.3  PLT 201   Basic Metabolic Panel: Recent Labs  Lab 11/02/23 1758  NA 137  K 3.8  CL 99  CO2 29  GLUCOSE 126*  BUN 22  CREATININE 0.72  CALCIUM 9.9   GFR: Estimated Creatinine Clearance: 48.5 mL/min (by C-G formula based on SCr of 0.72 mg/dL). Liver Function Tests: Recent Labs  Lab 11/02/23 1758  AST 16  ALT 10  ALKPHOS 71  BILITOT 1.0  PROT 7.6  ALBUMIN 3.9    Recent Results (from the past 240 hours)  Resp panel by RT-PCR (RSV, Flu A&B, Covid) Anterior Nasal Swab     Status: None   Collection Time: 11/02/23  9:38 PM   Specimen: Anterior Nasal Swab  Result Value Ref Range Status   SARS Coronavirus 2 by RT PCR NEGATIVE NEGATIVE Final    Comment: (NOTE) SARS-CoV-2 target nucleic acids are NOT DETECTED.  The SARS-CoV-2 RNA is generally detectable in upper respiratory specimens during the acute phase of infection. The lowest concentration of SARS-CoV-2 viral copies this assay can detect is 138 copies/mL. A negative result does not preclude SARS-Cov-2 infection and should not be used as the sole basis for treatment or other patient management decisions. A negative result may occur with  improper specimen collection/handling, submission of specimen other than nasopharyngeal swab, presence of viral mutation(s) within the areas targeted by this assay, and inadequate number of viral copies(<138 copies/mL). A negative result must be combined with clinical observations, patient history, and epidemiological information. The expected result is Negative.  Fact Sheet for Patients:  BloggerCourse.com  Fact Sheet for Healthcare Providers:  SeriousBroker.it  This test is no t yet approved or cleared by the [place]  FDA and  has been authorized for detection and/or diagnosis of SARS-CoV-2  by FDA under an Emergency Use Authorization (EUA). This EUA will remain  in effect (meaning this test can be used) for the duration of the COVID-19 declaration under Section 564(b)(1) of the Act, 21 U.S.C.section 360bbb-3(b)(1), unless the authorization is terminated  or revoked sooner.       Influenza A by PCR NEGATIVE NEGATIVE Final   Influenza B by PCR NEGATIVE NEGATIVE Final    Comment: (NOTE) The Xpert Xpress SARS-CoV-2/FLU/RSV plus assay is intended as an aid in the diagnosis of influenza from Nasopharyngeal swab specimens and should not be used as a sole basis for treatment. Nasal washings and aspirates are unacceptable for Xpert Xpress SARS-CoV-2/FLU/RSV testing.  Fact Sheet for Patients: BloggerCourse.com  Fact Sheet for Healthcare Providers: SeriousBroker.it  This test is not yet approved or cleared by the [place]  FDA and has been authorized for detection  and/or diagnosis of SARS-CoV-2 by FDA under an Emergency Use Authorization (EUA). This EUA will remain in effect (meaning this test can be used) for the duration of the COVID-19 declaration under Section 564(b)(1) of the Act, 21 U.S.C. section 360bbb-3(b)(1), unless the authorization is terminated or revoked.     Resp Syncytial Virus by PCR NEGATIVE NEGATIVE Final    Comment: (NOTE) Fact Sheet for Patients: BloggerCourse.com  Fact Sheet for Healthcare Providers: SeriousBroker.it  This test is not yet approved or cleared by the [place]  FDA and has been authorized for detection and/or diagnosis of SARS-CoV-2 by FDA under an Emergency Use Authorization (EUA). This EUA will remain in effect (meaning this test can be used) for the duration of the COVID-19 declaration under Section 564(b)(1) of the Act, 21 U.S.C. section 360bbb-3(b)(1), unless the authorization is terminated or revoked.  Performed at  [facility], [address] [place], [place] [address]     Radiology Studies: No results found.  Scheduled Meds:  amLODipine   5 mg Oral Daily   dextromethorphan -guaiFENesin   1 tablet Oral BID   enoxaparin  (LOVENOX ) injection  40 mg Subcutaneous Q24H   insulin  aspart  0-5 Units Subcutaneous QHS   insulin  aspart  0-6 Units Subcutaneous TID WC   ipratropium-albuterol   3 mL Nebulization TID   levothyroxine   137 mcg Oral Q0600   methylPREDNISolone  (SOLU-MEDROL ) injection  60 mg Intravenous Q12H   Pirfenidone   801 mg Oral TID WC   Continuous Infusions:   LOS: 2 days   Colin Dawley M.D on 11/05/2023 at 5:21 PM  Go to [email or web address] - for contact info  [facility] - Office  [phone]  If 7PM-7AM, please contact night-coverage [email or web address] 11/05/2023, 5:21 PM

## 2023-11-05 NOTE — Plan of Care (Signed)

## 2023-11-06 DIAGNOSIS — J9601 Acute respiratory failure with hypoxia: Secondary | ICD-10-CM | POA: Diagnosis not present

## 2023-11-06 LAB — GLUCOSE, CAPILLARY
Glucose-Capillary: 152 mg/dL — ABNORMAL HIGH (ref 70–99)
Glucose-Capillary: 218 mg/dL — ABNORMAL HIGH (ref 70–99)
Glucose-Capillary: 220 mg/dL — ABNORMAL HIGH (ref 70–99)
Glucose-Capillary: 161 mg/dL — ABNORMAL HIGH (ref 70–99)

## 2023-11-06 MED ORDER — IPRATROPIUM-ALBUTEROL 0.5-2.5 (3) MG/3ML IN SOLN
3.0000 mL | Freq: Two times a day (BID) | RESPIRATORY_TRACT | Status: DC
  Administered 2023-11-06 – 2023-11-07 (×2): 3 mL via RESPIRATORY_TRACT
  Filled 2023-11-06 (×2): qty 3

## 2023-11-06 NOTE — Progress Notes (Signed)
 SATURATION QUALIFICATIONS: (This note is used to comply with regulatory documentation for home oxygen)  Patient Saturations on Room Air at Rest = 91%  Patient Saturations on Room Air while Ambulating = 88%  Patient Saturations on 2 Liters of oxygen while Ambulating = 99%  Please briefly explain why patient needs home oxygen: Patient's oxygen level drops below 90% while ambulating on room air. Patient states she would be more comfortable going home if she had oxygen as a back-up for when she feels short of breath.

## 2023-11-06 NOTE — Progress Notes (Signed)
 PROGRESS NOTE  Debra Jimenez, is a 73 y.o. female, DOB - 08-23-1950, ZOX:[id number]  Admit date - 11/02/2023   Admitting Physician Kharizma Lesnick Quintella Buck, MD  Outpatient Primary MD for the patient is Skillman, Katherine E, PA-C  LOS - 3  Chief Complaint  Patient presents with   Shortness of Breath      Brief Narrative:  73 y.o. female with medical history significant for idiopathic pulmonary fibrosis, hypertension, depression, hypothyroidism admitted on 11/02/2023 with acute hypoxic respiratory failure secondary to IPF flareup    -Assessment and Plan: 1)Acute hypoxic respiratory failure --due to IPF flareup - CTA chest negative for PE, shows severe fibrotic interstitial lung disease.  Troponin 3 > 5.  BNP 17.  EKG unchanged. -Continue IV Solu-Medrol , mucolytics and bronchodilators 11/06/23 Cough and wheezing improving = No significant dyspnea at rest - Dyspnea on exertion improving - Desaturated to 88% on room air  2)Insomnia/anxiety disorder--- Restoril  nightly,   -Hold PTA Ambien   3)Acquired hypothyroidism -Continue levothyroxine   4)IPF (idiopathic pulmonary fibrosis) (HCC) Follows with pulmonologist Dr. Celene Coins - c/n  pirfenidone  - Continue IV Solu-Medrol  for IPF flareup as above #1 -Cough and wheezing improving  5)HTN- -c/n  amlodipine  -Hold losartan 50mg  for contrast exposure - May use IV labetalol  as needed elevated BP  Status is: Inpatient   Disposition: The patient is from: Home              Anticipated d/c is to: Home              Anticipated d/c date is: 1 day              Patient currently is not medically stable to d/c. Barriers: Not Clinically Stable-   Code Status :  -  Code Status: Full Code   Family Communication:     (patient is alert, awake and coherent)  Discussed with husband at bedside  DVT Prophylaxis  :   - SCDs  enoxaparin  (LOVENOX ) injection 40 mg Start: 11/03/23 1000   Lab Results  Component Value Date   PLT 201 11/02/2023    Inpatient Medications  Scheduled Meds:  amLODipine   5 mg Oral Daily   dextromethorphan -guaiFENesin   1 tablet Oral BID   enoxaparin  (LOVENOX ) injection  40 mg Subcutaneous Q24H   insulin  aspart  0-5 Units Subcutaneous QHS   insulin  aspart  0-6 Units Subcutaneous TID WC   ipratropium-albuterol   3 mL Nebulization BID   levothyroxine   137 mcg Oral Q0600   methylPREDNISolone  (SOLU-MEDROL ) injection  60 mg Intravenous Q12H   Pirfenidone   801 mg Oral TID WC   Continuous Infusions: PRN Meds:.acetaminophen  **OR** acetaminophen , albuterol , ALPRAZolam , labetalol , ondansetron  **OR** ondansetron  (ZOFRAN ) IV, phenol, polyethylene glycol   Anti-infectives (From admission, onward)    None      Subjective: Debra Jimenez today has no fevers, no emesis,  No chest pain,   - - Husband at bedside, questions answered -Cough and wheezing improving = No significant dyspnea at rest - Dyspnea on exertion improving - Desaturated to 88% on room air  Objective: Vitals:   11/06/23 0554 11/06/23 0758 11/06/23 0953 11/06/23 1209  BP: 138/84  (!) 146/83 (!) 152/87  Pulse: 72   78  Resp: 18     Temp: 98.1 F (36.7 C)   98.3 F (36.8 C)  TempSrc: Oral   Oral  SpO2: 99% 98%  100%  Weight:      Height:       No intake or output data in the 24  hours ending 11/06/23 1939  Filed Weights   11/02/23 1654 11/02/23 2214  Weight: 51.4 kg 49.1 kg   Physical Exam Gen:- Awake Alert, in no apparent distress  HEENT:- [place].AT, No sclera icterus Nose- [place] 2L/min Neck-Supple Neck,No JVD,.  Lungs-diminished sounds with Velcro type rales bilaterally CV- S1, S2 normal, regular  Abd-  +ve B.Sounds, Abd Soft, No tenderness,    Extremity/Skin:- No  edema, pedal pulses present  Psych-affect is appropriate, oriented x3 Neuro-no new focal deficits, no tremors  Data Reviewed: I have personally reviewed following labs and imaging studies  CBC: Recent Labs  Lab 11/02/23 1758  WBC 7.4  NEUTROABS 5.6  HGB 13.6   HCT 42.8  MCV 94.3  PLT 201   Basic Metabolic Panel: Recent Labs  Lab 11/02/23 1758  NA 137  K 3.8  CL 99  CO2 29  GLUCOSE 126*  BUN 22  CREATININE 0.72  CALCIUM 9.9   GFR: Estimated Creatinine Clearance: 48.5 mL/min (by C-G formula based on SCr of 0.72 mg/dL). Liver Function Tests: Recent Labs  Lab 11/02/23 1758  AST 16  ALT 10  ALKPHOS 71  BILITOT 1.0  PROT 7.6  ALBUMIN 3.9    Recent Results (from the past 240 hours)  Resp panel by RT-PCR (RSV, Flu A&B, Covid) Anterior Nasal Swab     Status: None   Collection Time: 11/02/23  9:38 PM   Specimen: Anterior Nasal Swab  Result Value Ref Range Status   SARS Coronavirus 2 by RT PCR NEGATIVE NEGATIVE Final    Comment: (NOTE) SARS-CoV-2 target nucleic acids are NOT DETECTED.  The SARS-CoV-2 RNA is generally detectable in upper respiratory specimens during the acute phase of infection. The lowest concentration of SARS-CoV-2 viral copies this assay can detect is 138 copies/mL. A negative result does not preclude SARS-Cov-2 infection and should not be used as the sole basis for treatment or other patient management decisions. A negative result may occur with  improper specimen collection/handling, submission of specimen other than nasopharyngeal swab, presence of viral mutation(s) within the areas targeted by this assay, and inadequate number of viral copies(<138 copies/mL). A negative result must be combined with clinical observations, patient history, and epidemiological information. The expected result is Negative.  Fact Sheet for Patients:  BloggerCourse.com  Fact Sheet for Healthcare Providers:  SeriousBroker.it  This test is no t yet approved or cleared by the [place]  FDA and  has been authorized for detection and/or diagnosis of SARS-CoV-2 by FDA under an Emergency Use Authorization (EUA). This EUA will remain  in effect (meaning this test can be used)  for the duration of the COVID-19 declaration under Section 564(b)(1) of the Act, 21 U.S.C.section 360bbb-3(b)(1), unless the authorization is terminated  or revoked sooner.       Influenza A by PCR NEGATIVE NEGATIVE Final   Influenza B by PCR NEGATIVE NEGATIVE Final    Comment: (NOTE) The Xpert Xpress SARS-CoV-2/FLU/RSV plus assay is intended as an aid in the diagnosis of influenza from Nasopharyngeal swab specimens and should not be used as a sole basis for treatment. Nasal washings and aspirates are unacceptable for Xpert Xpress SARS-CoV-2/FLU/RSV testing.  Fact Sheet for Patients: BloggerCourse.com  Fact Sheet for Healthcare Providers: SeriousBroker.it  This test is not yet approved or cleared by the [place]  FDA and has been authorized for detection and/or diagnosis of SARS-CoV-2 by FDA under an Emergency Use Authorization (EUA). This EUA will remain in effect (meaning this test can be used) for the  duration of the COVID-19 declaration under Section 564(b)(1) of the Act, 21 U.S.C. section 360bbb-3(b)(1), unless the authorization is terminated or revoked.     Resp Syncytial Virus by PCR NEGATIVE NEGATIVE Final    Comment: (NOTE) Fact Sheet for Patients: BloggerCourse.com  Fact Sheet for Healthcare Providers: SeriousBroker.it  This test is not yet approved or cleared by the [place]  FDA and has been authorized for detection and/or diagnosis of SARS-CoV-2 by FDA under an Emergency Use Authorization (EUA). This EUA will remain in effect (meaning this test can be used) for the duration of the COVID-19 declaration under Section 564(b)(1) of the Act, 21 U.S.C. section 360bbb-3(b)(1), unless the authorization is terminated or revoked.  Performed at [facility], [address], [place], [place] [address]     Radiology Studies: No results found.  Scheduled Meds:   amLODipine   5 mg Oral Daily   dextromethorphan -guaiFENesin   1 tablet Oral BID   enoxaparin  (LOVENOX ) injection  40 mg Subcutaneous Q24H   insulin  aspart  0-5 Units Subcutaneous QHS   insulin  aspart  0-6 Units Subcutaneous TID WC   ipratropium-albuterol   3 mL Nebulization BID   levothyroxine   137 mcg Oral Q0600   methylPREDNISolone  (SOLU-MEDROL ) injection  60 mg Intravenous Q12H   Pirfenidone   801 mg Oral TID WC   Continuous Infusions:   LOS: 3 days   Colin Dawley M.D on 11/06/2023 at 7:39 PM  Go to [email or web address] - for contact info  [facility] - Office  [phone]  If 7PM-7AM, please contact night-coverage [email or web address] 11/06/2023, 7:39 PM

## 2023-11-07 DIAGNOSIS — J9601 Acute respiratory failure with hypoxia: Secondary | ICD-10-CM | POA: Diagnosis not present

## 2023-11-07 DIAGNOSIS — J449 Chronic obstructive pulmonary disease, unspecified: Secondary | ICD-10-CM | POA: Diagnosis present

## 2023-11-07 LAB — GLUCOSE, CAPILLARY
Glucose-Capillary: 186 mg/dL — ABNORMAL HIGH (ref 70–99)
Glucose-Capillary: 363 mg/dL — ABNORMAL HIGH (ref 70–99)

## 2023-11-07 MED ORDER — PREDNISONE 20 MG PO TABS
40.0000 mg | ORAL_TABLET | Freq: Every day | ORAL | 0 refills | Status: AC
Start: 2023-11-07 — End: 2023-11-14

## 2023-11-07 MED ORDER — COMPRESSOR/NEBULIZER MISC: 1.0000 [IU] | Freq: Every day | 0 refills | Status: AC | PRN

## 2023-11-07 MED ORDER — ALBUTEROL SULFATE HFA 108 (90 BASE) MCG/ACT IN AERS: 2.0000 | INHALATION_SPRAY | Freq: Four times a day (QID) | RESPIRATORY_TRACT | 2 refills | Status: AC | PRN

## 2023-11-07 MED ORDER — ALBUTEROL SULFATE (2.5 MG/3ML) 0.083% IN NEBU: 2.5000 mg | INHALATION_SOLUTION | RESPIRATORY_TRACT | 12 refills | Status: DC | PRN

## 2023-11-07 MED ORDER — COMPRESSOR/NEBULIZER MISC: 1.0000 [IU] | Freq: Every day | 0 refills | Status: DC | PRN

## 2023-11-07 MED ORDER — PANTOPRAZOLE SODIUM 40 MG PO TBEC: 40.0000 mg | DELAYED_RELEASE_TABLET | Freq: Every day | ORAL | 0 refills | Status: AC

## 2023-11-07 MED ORDER — GUAIFENESIN ER 600 MG PO TB12: 600.0000 mg | ORAL_TABLET | Freq: Two times a day (BID) | ORAL | 2 refills | Status: AC

## 2023-11-07 NOTE — Discharge Instructions (Signed)
 1) please take medications as prescribed 2) follow-up with Dr.Alva and pulmonary team for recheck and reevaluation as advised

## 2023-11-07 NOTE — Discharge Summary (Signed)
 Debra Jimenez, is a 73 y.o. female  DOB 03-Dec-1950  MRN [id number].  Admission date:  11/02/2023  Admitting Physician  Colin Dawley, MD  Discharge Date:  11/07/2023   Primary MD  Skillman, Katherine E, PA-C  Recommendations for primary care physician for things to follow:   1) please take medications as prescribed 2) follow-up with [facility] and pulmonary team for recheck and reevaluation as advised  Admission Diagnosis  Acute hypoxic respiratory failure (HCC) [J96.01] Acute respiratory failure with hypoxia (HCC) [J96.01]   Discharge Diagnosis  Acute hypoxic respiratory failure (HCC) [J96.01] Acute respiratory failure with hypoxia (HCC) [J96.01]    Principal Problem:   Acute hypoxic respiratory failure (HCC) Active Problems:   Hypertension, essential   IPF (idiopathic pulmonary fibrosis) (HCC)   Acquired hypothyroidism   Depressive disorder   Acute respiratory failure with hypoxia (HCC)      Past Medical History:  Diagnosis Date   Anxiety    Hypothyroidism    Idiopathic pulmonary fibrosis (HCC)    OA (osteoarthritis)    RIGHT HIP AND LEFT HIP   Wears glasses     Past Surgical History:  Procedure Laterality Date   BLADDER TACK  2014  APPROX.   NO SLING   TOTAL HIP ARTHROPLASTY Right 07/17/2018   Procedure: TOTAL HIP ARTHROPLASTY ANTERIOR APPROACH;  Surgeon: Claiborne Crew, MD;  Location: [facility] ORS;  Service: Orthopedics;  Laterality: Right;  70 mins   VAGINAL HYSTERECTOMY  2003   w/  BSO     HPI  from the history and physical done on the day of admission:   Chief Complaint: Difficulty breathing   HPI: Debra Jimenez is a 73 y.o. female with medical history significant for idiopathic pulmonary fibrosis, hypertension, depression, hypothyroidism.   Patient presents to the ED with complaints of increasing difficulty breathing over the past 2 weeks.  She has a chronic mild cough that is  unchanged.  No chest pain no lower extremity swelling.  No fevers no chills.  She is not on home O2. She reports difficulty breathing with exertion, with O2 sat dropping to 79% on room air at home.  He has symptoms were so severe after having her back in his presentation to the ED.  She also reports fatigue.  Husband has Alzheimer's and she has been under stress, she felt some of her difficulty breathing was related to anxiety.   ED Course: O2 sats down to 75% on room air.  Temperature 97.9.  Heart rate 82-111.  Respirate rate 12-4.  Blood pressure systolic 158-197. VBG shows pH of 7.45.  BNP 17. CTA chest negative for PE, shows severe fibrotic interstitial lung disease. Hospitalist to admit for acute hypoxic respiratory failure   Review of Systems: As per HPI all other systems reviewed and negative.   Hospital Course:     Brief Narrative:  73 y.o. female with medical history significant for idiopathic pulmonary fibrosis, hypertension, depression, hypothyroidism admitted on 11/02/2023 with acute hypoxic respiratory failure secondary to IPF flareup     -Assessment and  Plan: 1)Acute hypoxic respiratory failure --due to IPF flareup - CTA chest negative for PE, shows severe fibrotic interstitial lung disease.  Troponin 3 > 5.  BNP 17.  EKG unchanged. - Much improved after treatment with IV Solu-Medrol , mucolytics and bronchodilators Cough and wheezing improved = No significant dyspnea at rest Hypoxia resolved -O2 sats on room air at rest 96%, -- post ambulation O2 sat on room air 93 to 94%   2)Insomnia/anxiety disorder--- ,   -c/n PTA Ambien    3)Acquired hypothyroidism -Continue levothyroxine    4)IPF (idiopathic pulmonary fibrosis) (HCC) Follows with pulmonologist Dr. Celene Coins - c/n  pirfenidone  - Much improved after treatment with IV Solu-Medrol  for IPF flareup as above #1 -Cough and wheezing improved -- Hypoxia resolved as above   5)HTN- -c/n  amlodipine  -c/n  losartan 50mg      Disposition: The patient is from: Home              Anticipated d/c is to: Home   Discharge Condition: Stable without hypoxia  Follow UP--- pulmonologist Dr. Villa Greaser   Diet and Activity recommendation:  As advised  Discharge Instructions    Discharge Instructions     Call MD for:  difficulty breathing, headache or visual disturbances   Complete by: As directed    Call MD for:  persistant dizziness or light-headedness   Complete by: As directed    Call MD for:  persistant nausea and vomiting   Complete by: As directed    Call MD for:  temperature >100.4   Complete by: As directed    Diet - low sodium heart healthy   Complete by: As directed    Discharge instructions   Complete by: As directed    1) please take medications as prescribed 2) follow-up with Dr.Alva and pulmonary team for recheck and reevaluation as advised   For home use only DME Nebulizer machine   Complete by: As directed    Patient needs a nebulizer to treat with the following condition: Pulmonary fibrosis (HCC)   Length of Need: Lifetime   Additional equipment included:  Administration kit Filter     Increase activity slowly   Complete by: As directed          Discharge Medications     Allergies as of 11/07/2023       Reactions   Atorvastatin Other (See Comments)    Muscle aches. Has tried 2 different statins and unable to tolerate   Doxycycline Nausea And Vomiting   Intolerance due to perfenidone        Medication List     STOP taking these medications    docusate sodium  100 MG capsule Commonly known as: COLACE       TAKE these medications    albuterol  (2.5 MG/3ML) 0.083% nebulizer solution Commonly known as: PROVENTIL  Take 3 mLs (2.5 mg total) by nebulization every 4 (four) hours as needed for wheezing or shortness of breath.   albuterol  108 (90 Base) MCG/ACT inhaler Commonly known as: VENTOLIN  HFA Inhale 2 puffs into the lungs every 6 (six) hours as needed for wheezing or  shortness of breath.   ALPRAZolam  0.25 MG tablet Commonly known as: XANAX  Take 0.25 mg by mouth daily as needed for anxiety or sleep.   Compressor/Nebulizer Misc 1 Units by Does not apply route daily as needed.   ezetimibe 10 MG tablet Commonly known as: ZETIA Take 10 mg by mouth daily.   guaiFENesin  600 MG 12 hr tablet Commonly known as: Mucinex  Take  1 tablet (600 mg total) by mouth 2 (two) times daily.   levothyroxine  137 MCG tablet Commonly known as: SYNTHROID  Take 137 mcg by mouth every morning.   losartan 50 MG tablet Commonly known as: COZAAR Take 50 mg by mouth daily.   ondansetron  4 MG tablet Commonly known as: ZOFRAN  TAKE 1 TABLET BY MOUTH TWICE DAILY AS NEEDED FOR NAUSEA AND VOMITING What changed: See the new instructions.   OVER THE COUNTER MEDICATION Take 1-2 tablets by mouth daily. CBD gummies for appetite stimulation   pantoprazole  40 MG tablet Commonly known as: Protonix  Take 1 tablet (40 mg total) by mouth daily.   Pirfenidone  801 MG Tabs Commonly known as: Esbriet  Take 1 tablet (801 mg total) by mouth 3 (three) times daily with meals.   polyethylene glycol 17 g packet Commonly known as: MIRALAX  / GLYCOLAX  Take 17 g by mouth daily as needed for mild constipation.   predniSONE  20 MG tablet Commonly known as: DELTASONE  Take 2 tablets (40 mg total) by mouth daily with breakfast for 7 days.   zolpidem  5 MG tablet Commonly known as: AMBIEN  Take 5 mg by mouth at bedtime.               Durable Medical Equipment  (From admission, onward)           Start     Ordered   11/07/23 0000  For home use only DME Nebulizer machine       Question Answer Comment  Patient needs a nebulizer to treat with the following condition Pulmonary fibrosis (HCC)   Length of Need Lifetime   Additional equipment included Administration kit   Additional equipment included Filter      11/07/23 1223            Major procedures and Radiology Reports -  PLEASE review detailed and final reports for all details, in brief -   CT Angio Chest PE W and/or Wo Contrast Result Date: 11/02/2023 CLINICAL DATA:  Pulmonary embolism (PE) suspected, high prob. Shortness of breath EXAM: CT ANGIOGRAPHY CHEST WITH CONTRAST TECHNIQUE: Multidetector CT imaging of the chest was performed using the standard protocol during bolus administration of intravenous contrast. Multiplanar CT image reconstructions and MIPs were obtained to evaluate the vascular anatomy. RADIATION DOSE REDUCTION: This exam was performed according to the departmental dose-optimization program which includes automated exposure control, adjustment of the mA and/or kV according to patient size and/or use of iterative reconstruction technique. CONTRAST:  75mL OMNIPAQUE  IOHEXOL  350 MG/ML SOLN COMPARISON:  10/24/2022 FINDINGS: Cardiovascular: Heart is normal size. Aorta is normal caliber. Scattered aortic calcifications. No evidence of aortic dissection. No filling defects in the pulmonary arteries to suggest pulmonary emboli. Mediastinum/Nodes: Mildly prominent mediastinal and bilateral hilar lymph nodes, likely reactive and unchanged since prior study. No axillary adenopathy. Trachea and esophagus are unremarkable. Thyroid  unremarkable. Lungs/Pleura: Severe interstitial thickening and honeycombing peripherally in the lungs compatible with fibrosis, unchanged since prior study. No acute confluent opacities, effusions or pneumothorax. Upper Abdomen: No acute findings Musculoskeletal: Chest wall soft tissues are unremarkable. No acute bony abnormality. Review of the MIP images confirms the above findings. IMPRESSION: No evidence of pulmonary embolus. Severe fibrotic interstitial lung disease, stable since prior study. No acute cardiopulmonary disease. Aortic Atherosclerosis (ICD10-I70.0). Electronically Signed   By: Janeece Mechanic M.D.   On: 11/02/2023 19:21   DG Chest 2 View Result Date: 11/02/2023 CLINICAL DATA:   chest pain, SOB EXAM: CHEST - 2 VIEW COMPARISON:  None available. FINDINGS:  Biapical pleural thickening. Fairly diffuse reticulation, predominantly in a subpleural, peripheral and bibasilar orientation. No lobar consolidation, pleural effusion, or pneumothorax. Unchanged region of subpleural nodularity in the lateral right mid lung, likely scarring along the fissure. No cardiomegaly. Aortic atherosclerosis. No acute fracture or destructive lesions. Multilevel thoracic osteophytosis. IMPRESSION: Redemonstrated findings consistent with patient's known interstitial lung disease. Otherwise, no acute cardiopulmonary abnormality. Electronically Signed   By: Rance Burrows M.D.   On: 11/02/2023 17:38    Micro Results  Recent Results (from the past 240 hours)  Resp panel by RT-PCR (RSV, Flu A&B, Covid) Anterior Nasal Swab     Status: None   Collection Time: 11/02/23  9:38 PM   Specimen: Anterior Nasal Swab  Result Value Ref Range Status   SARS Coronavirus 2 by RT PCR NEGATIVE NEGATIVE Final    Comment: (NOTE) SARS-CoV-2 target nucleic acids are NOT DETECTED.  The SARS-CoV-2 RNA is generally detectable in upper respiratory specimens during the acute phase of infection. The lowest concentration of SARS-CoV-2 viral copies this assay can detect is 138 copies/mL. A negative result does not preclude SARS-Cov-2 infection and should not be used as the sole basis for treatment or other patient management decisions. A negative result may occur with  improper specimen collection/handling, submission of specimen other than nasopharyngeal swab, presence of viral mutation(s) within the areas targeted by this assay, and inadequate number of viral copies(<138 copies/mL). A negative result must be combined with clinical observations, patient history, and epidemiological information. The expected result is Negative.  Fact Sheet for Patients:  BloggerCourse.com  Fact Sheet for Healthcare  Providers:  SeriousBroker.it  This test is no t yet approved or cleared by the [place]  FDA and  has been authorized for detection and/or diagnosis of SARS-CoV-2 by FDA under an Emergency Use Authorization (EUA). This EUA will remain  in effect (meaning this test can be used) for the duration of the COVID-19 declaration under Section 564(b)(1) of the Act, 21 U.S.C.section 360bbb-3(b)(1), unless the authorization is terminated  or revoked sooner.       Influenza A by PCR NEGATIVE NEGATIVE Final   Influenza B by PCR NEGATIVE NEGATIVE Final    Comment: (NOTE) The Xpert Xpress SARS-CoV-2/FLU/RSV plus assay is intended as an aid in the diagnosis of influenza from Nasopharyngeal swab specimens and should not be used as a sole basis for treatment. Nasal washings and aspirates are unacceptable for Xpert Xpress SARS-CoV-2/FLU/RSV testing.  Fact Sheet for Patients: BloggerCourse.com  Fact Sheet for Healthcare Providers: SeriousBroker.it  This test is not yet approved or cleared by the [place]  FDA and has been authorized for detection and/or diagnosis of SARS-CoV-2 by FDA under an Emergency Use Authorization (EUA). This EUA will remain in effect (meaning this test can be used) for the duration of the COVID-19 declaration under Section 564(b)(1) of the Act, 21 U.S.C. section 360bbb-3(b)(1), unless the authorization is terminated or revoked.     Resp Syncytial Virus by PCR NEGATIVE NEGATIVE Final    Comment: (NOTE) Fact Sheet for Patients: BloggerCourse.com  Fact Sheet for Healthcare Providers: SeriousBroker.it  This test is not yet approved or cleared by the [place]  FDA and has been authorized for detection and/or diagnosis of SARS-CoV-2 by FDA under an Emergency Use Authorization (EUA). This EUA will remain in effect (meaning this test can be  used) for the duration of the COVID-19 declaration under Section 564(b)(1) of the Act, 21 U.S.C. section 360bbb-3(b)(1), unless the authorization is terminated or revoked.  Performed  at [facility], [address], [place], [place] [address]     Today   Subjective    Nikki Glanzer today has no new complaints - Cough and dyspnea improved significantly - Hypoxia resolved - O2 sats on room air at rest 96% - Post ambulation O2 sats 93 to 94% on room air   Patient has been seen and examined prior to discharge   Objective   Blood pressure (!) 144/91, pulse 74, temperature 97.9 F (36.6 C), temperature source Oral, resp. rate 20, height 5' 5.5" (1.664 m), weight 49.1 kg, SpO2 99%.   Intake/Output Summary (Last 24 hours) at 11/07/2023 1226 Last data filed at 11/07/2023 0930 Gross per 24 hour  Intake 240 ml  Output --  Net 240 ml    Exam Gen:- Awake Alert, no acute distress , no conversational dyspnea HEENT:- [place].AT, No sclera icterus Neck-Supple Neck,No JVD,.  Lungs-improved air movement, no wheezing, Velcro type rales bilaterally CV- S1, S2 normal, regular Abd-  +ve B.Sounds, Abd Soft, No tenderness,    Extremity/Skin:- No  edema,   good pulses Psych-affect is appropriate, oriented x3 Neuro-no new focal deficits, no tremors    Data Review   CBC w Diff:  Lab Results  Component Value Date   WBC 7.4 11/02/2023   HGB 13.6 11/02/2023   HCT 42.8 11/02/2023   PLT 201 11/02/2023   LYMPHOPCT 10 11/02/2023   MONOPCT 9 11/02/2023   EOSPCT 3 11/02/2023   BASOPCT 0 11/02/2023   CMP:  Lab Results  Component Value Date   NA 137 11/02/2023   NA 143 11/10/2021   K 3.8 11/02/2023   CL 99 11/02/2023   CO2 29 11/02/2023   BUN 22 11/02/2023   BUN 16 11/10/2021   CREATININE 0.72 11/02/2023   PROT 7.6 11/02/2023   PROT 7.2 09/07/2023   ALBUMIN 3.9 11/02/2023   ALBUMIN 4.3 09/07/2023   BILITOT 1.0 11/02/2023   BILITOT 0.3 09/07/2023   ALKPHOS 71 11/02/2023   AST 16  11/02/2023   ALT 10 11/02/2023  .  Total Discharge time is about 33 minutes  Colin Dawley M.D on 11/07/2023 at 12:26 PM  Go to [email or web address] -  for contact info  [facility] - Office  [phone]

## 2023-11-07 NOTE — Progress Notes (Signed)
SATURATION QUALIFICATIONS: (This note is used to comply with regulatory documentation for home oxygen)  Patient Saturations on Room Air at Rest = 96%  Patient Saturations on Room Air while Ambulating = 93%  

## 2023-11-07 NOTE — Plan of Care (Signed)

## 2023-11-07 NOTE — TOC Transition Note (Signed)
 Transition of Care [facility]) - Discharge Note   Patient Details  Name: Debra Jimenez MRN: [id number] Date of Birth: 12-21-1950  Transition of Care [facility]) CM/SW Contact:  Ander Katos, LCSW Phone Number: 11/07/2023, 12:34 PM   Clinical Narrative:  Pt will need nebulizer. Discussed with pt who has no preference on agency. Referred to Zach with [employer] to deliver to room prior to d/c today.        Barriers to Discharge: Barriers Resolved   Patient Goals and CMS Choice            Discharge Placement                       Discharge Plan and Services Additional resources added to the After Visit Summary for                  DME Arranged: Nebulizer machine DME Agency: [employer] Date DME Agency Contacted: 11/07/23 Time DME Agency Contacted: 1234 Representative spoke with at DME Agency: Gladys Lamp            Social Drivers of Health (SDOH) Interventions SDOH Screenings   Food Insecurity: No Food Insecurity (11/02/2023)  Housing: Low Risk  (11/02/2023)  Transportation Needs: No Transportation Needs (11/02/2023)  Utilities: Not At Risk (11/02/2023)  Depression (PHQ2-9): High Risk (03/22/2021)  Social Connections: Moderately Integrated (11/02/2023)  Tobacco Use: Medium Risk (11/03/2023)     Readmission Risk Interventions     No data to display

## 2023-11-07 NOTE — Inpatient Diabetes Management (Signed)
 Inpatient Diabetes Program Recommendations  AACE/ADA: New Consensus Statement on Inpatient Glycemic Control  Target Ranges:  Prepandial:   less than 140 mg/dL      Peak postprandial:   less than 180 mg/dL (1-2 hours)      Critically ill patients:  140 - 180 mg/dL    Latest Reference Range & Units 11/06/23 08:05 11/06/23 11:30 11/06/23 16:25 11/06/23 21:43 11/07/23 07:58 11/07/23 11:05  Glucose-Capillary 70 - 99 mg/dL 161 (H) 096 (H) 045 (H) 161 (H) 186 (H) 363 (H)   Review of Glycemic Control  Diabetes history: No Outpatient Diabetes medications: NA Current orders for Inpatient glycemic control: Novolog  0-6 units TID with meals, Novolog  0-5 unit at bedtime; Solumedrol 60 mg Q12H  Inpatient Diabetes Program Recommendations:    Insulin : If steroids are continued, please consider ordering Novolog  3 units TID with meals for meal coverage if patient eats at least 50% of meals.  Thanks, Beacher Limerick, RN, MSN, CDCES Diabetes Coordinator Inpatient Diabetes Program [phone] (Team Pager from 8am to 5pm)

## 2023-11-07 NOTE — Care Management Important Message (Signed)
 Important Message  Patient Details  Name: Debra Jimenez MRN: [id number] Date of Birth: 30-Sep-1950   Important Message Given:  Yes - Medicare IM     Ravinder Hofland L Justice Aguirre 11/07/2023, 11:24 AM

## 2023-11-08 ENCOUNTER — Ambulatory Visit: Payer: Self-pay

## 2023-11-08 NOTE — Telephone Encounter (Signed)
 Pt has already been advised We cannot order oxygen without a recent OV documenting patient's O2 sats requiring need for supplemental O2. This has not been done. Pt has an appt with [name] on 11/30/2023 for this. Any other recommendations for this pt?

## 2023-11-08 NOTE — Telephone Encounter (Signed)
 Pt called in frustrated about not being able to get home oxygen ordered. Pt is audibly short of breath and unable to speak more than 2 words without stopping for air. Pt advises this is due to her anxiety/frustration about this situation and she is not in "distress" although she reports her Sp02 dropping into the 70s today with activity. Pt reports her Sp02  is 90% currently as she is "borrowing" a portable O2 concentrator from her sister and is currently wearing 2 LPM. This RN advised 911, repeated education provided, pt continues to decline. Pt requests a message sent to Dr. Villa Greaser as she would like him to review her recent imaging/admissions and advise. CAL notified. This RN educated pt on home care, new-worsening symptoms, when to call back/seek emergent care. Pt verbalized understanding and agrees to plan.   E2C2 Pulmonary Triage - Initial Assessment Questions "Chief Complaint (e.g., cough, sob, wheezing, fever, chills, sweat or additional symptoms) *Go to specific symptom protocol after initial questions. Difficulty breathing, hypoxia  "How long have symptoms been present?" Ongoing  Have you tested for COVID or Flu? Note: If not, ask patient if a home test can be taken. If so, instruct patient to call back for positive results. No  MEDICINES:   "Have you used any OTC meds to help with symptoms?" No If yes, ask "What medications?" NA  "Have you used your inhalers/maintenance medication?" No If yes, "What medications?" Pt reports her sister has been unable to pick up refills  If inhaler, ask "How many puffs and how often?" Note: Review instructions on medication in the chart. NA  OXYGEN: "Do you wear supplemental oxygen?" No If yes, "How many liters are you supposed to use?" Pt was on 02 at the hospital but does not yet have home O2  "Do you monitor your oxygen levels?" Yes If yes, "What is your reading (oxygen level) today?" Has fallen into the 70s today  "What is your usual  oxygen saturation reading?"  (Note: Pulmonary O2 sats should be 90% or greater) 93%   Copied from CRM [id number]. Topic: Clinical - Red Word Triage >> Nov 08, 2023  1:00 PM Hilton Lucky wrote: Red Word that prompted transfer to Nurse Triage: Patient states fresh out of hospital where she was on oxygen. She states she is not being provided oxygen at home and wants it today.   States O2 levels are getting into the 70s numerous times today. Having audible trouble breathing, taking sharp quick breaths after speaking. Reason for Disposition  SEVERE difficulty breathing (e.g., struggling for each breath, speaks in single words)  Answer Assessment - Initial Assessment Questions 1. RESPIRATORY STATUS: "Describe your breathing?" (e.g., wheezing, shortness of breath, unable to speak, severe coughing)      SOB 2. ONSET: "When did this breathing problem begin?"      Ongoing 3. PATTERN "Does the difficult breathing come and go, or has it been constant since it started?"      Constant 4. SEVERITY: "How bad is your breathing?" (e.g., mild, moderate, severe)    - MILD: No SOB at rest, mild SOB with walking, speaks normally in sentences, can lie down, no retractions, pulse < 100.    - MODERATE: SOB at rest, SOB with minimal exertion and prefers to sit, cannot lie down flat, speaks in phrases, mild retractions, audible wheezing, pulse 100-120.    - SEVERE: Very SOB at rest, speaks in single words, struggling to breathe, sitting hunched forward, retractions, pulse > 120  Moderate-Severe 5. RECURRENT SYMPTOM: "Have you had difficulty breathing before?" If Yes, ask: "When was the last time?" and "What happened that time?"      Yes  8. CAUSE: "What do you think is causing the breathing problem?"      Chronic  9. OTHER SYMPTOMS: "Do you have any other symptoms? (e.g., dizziness, runny nose, cough, chest pain, fever)     Pt audibly having difficulty  10. O2 SATURATION MONITOR:  "Do you use an oxygen saturation  monitor (pulse oximeter) at home?" If Yes, ask: "What is your reading (oxygen level) today?" "What is your usual oxygen saturation reading?" (e.g., 95%)       Into the 70s today  Protocols used: Breathing Difficulty-A-AH

## 2023-11-09 ENCOUNTER — Telehealth: Payer: Self-pay | Admitting: Nurse Practitioner

## 2023-11-09 ENCOUNTER — Ambulatory Visit (HOSPITAL_BASED_OUTPATIENT_CLINIC_OR_DEPARTMENT_OTHER): Payer: Self-pay | Admitting: Pulmonary Disease

## 2023-11-09 NOTE — Telephone Encounter (Signed)
 Spoke with pt she is aware of the process now for oxygen qualification. She will try to make the appt on 11/20/2023 and use her sisters oxygen when she needs it. Per Dr Villa Greaser if she has this it is okay for her to use it to maintain her Sats above 90% when active until we see her in clinic and can get her own oxygen. She has calmed down tremendously on the phone with me and I did remind her to take nice deep breaths when using the oxygen.

## 2023-11-09 NOTE — Telephone Encounter (Signed)
 First avaiable is 11/30/2023 with Alston Jerry and pt is already in that slot. Per Sammi Crick we can't do just a walk it has to have a OV associated with it. Will pt be okay to wait until then?

## 2023-11-09 NOTE — Telephone Encounter (Signed)
 See other telephone encounter.

## 2023-11-09 NOTE — Telephone Encounter (Signed)
 Copied from CRM [id number]. Topic: Clinical - Red Word Triage >> Nov 09, 2023  3:34 PM Juliana Ocean wrote: Red Word that prompted transfer to Nurse Triage: pt states she is confused why she cannot get O2.  She says she really needs. Does want to wait until June 12. She states she is scared to wait until 6/12. She said Dr Villa Greaser already told her she needs oxygen, and her her anxiety is increasing.  Per agent, using her sister's portable tank in the meantime.  TRIAGE SUMMARY NOTE: Pt reporting that she is still having some SOB and weakness after hospital, reporting she was on 2L oxygen for 5 days in hospital, expected to have oxygen therapy installed asap after hospital since per pt Dr. Villa Greaser had told her at last office visit that pt was already eligible for oxygen therapy and "said it was up to me to decide," stating that "if I had known" that the process in obtaining oxygen therapy were this way, she states she would have said yes to oxygen therapy at that office visit. Pt reporting she doesn't understand why she should not have the oxygen therapy before this upcoming visit (scheduled by office this morning for 6/12), pt stating she thinks it is "not safe" for her to be without it and she is very anxious. Pt reporting she has been using her sister's portable oxygen tank at 2L as needed. Pt confirms she is not worsening but not better, but states that she has not needed oxygen or breathing treatments today, does not use inhaler, reporting her pulse ox was 83% upon waking this morning, maintaining at 90% for the day. Pt requesting oxygen therapy be installed asap. Advised pt go to ED if any worsening, warm transferred to CAL to see about earlier appt options and alert CAL to situation. Pt speaking to Tannessa at CAL. Please advise.  E2C2 Pulmonary Triage - Initial Assessment Questions "Chief Complaint (e.g., cough, sob, wheezing, fever, chills, sweat or additional symptoms) *Go to specific symptom protocol after  initial questions. My opinion is I want to have oxygen therapy brought to house now with all the info hospital has given Dr. Villa Greaser told me that I was eligible for oxygen therapy already [facility] it was up to me to decide Had said no that day because didn't want to be restricted to it but stating if she had known she would've said yes Anxiety of not having that here not helping my condition Wouldn't use unless needed it Should not have to have me come back over there after all the testing Using sister's, not used it today, been holding at 90% 2 breathing treatments yesterday Oxygen levels 83% this morning right after woke up, will get right back to 90% Staying at rest out of fear for SOB, get very scared when oxygen levels drop On 2L oxygen for 5 days in hospital so been using 2L on portable one from sister Don't think worse not whole lot better, weak still after laying in hospital bed, but when drop do get little lightheaded and heart rate goes up, chest tightening, with moving around just going up the hall and back Chest tightening when get into 70s% Dry nagging cough not all day Lost so much weight too, see in records per pt Her visitors are all saying that she's breathing and talking better today  "Have you used your inhalers/maintenance medication?" Yes If yes, "What medications?" Nebulizer albuterol , do feel improvement each time use it Not had to use inhaler,  didn't use before hospital because didn't know about spacer Have hard time taking deep breath, now with spacer will be much easier to use steroids  Reason for Disposition . Caller has already spoken with another triager and has no further questions.  Protocols used: No Contact or Duplicate Contact Call-A-AH

## 2023-11-09 NOTE — Telephone Encounter (Signed)
 Per Dr Villa Greaser he would like her seen sooner at any location if available. As we cannot just do a walk she has to have a OV with a provider

## 2023-11-09 NOTE — Telephone Encounter (Signed)
 Went to [facility] 09/07/23.  Needs to requal for O2 but can not wait for June appt. Nothing avail (so sorry) Please call to advise if we can double book?  [phone]  Can we get her on O2 as needed before appt time? She has been borrowing her sisters O2. States we have called her back and told her no can do.

## 2023-11-10 NOTE — Telephone Encounter (Signed)
 Spoke with pt again today she is going to keep her appt on 11/20/2023 with Dr Villa Greaser at [facility], she sounds and feels much better after our talk yesterday she has not required O2 at all today but will use her sisters if needed over the weekend. NFN

## 2023-11-10 NOTE — Telephone Encounter (Signed)
 Pt has an upcoming appointment on 11-20-23 with Dr Villa Greaser at the [facility] location. Please see if Canary Ceo, NP or Dr Villa Greaser is able to see pt sooner.

## 2023-11-10 NOTE — Telephone Encounter (Signed)
 She was hospitalized and just discharged 5/20. She should've been sent home with oxygen; there is documentation in the discharge summary that she desaturated to 88%. Did they not send her home with oxygen?

## 2023-11-14 ENCOUNTER — Telehealth: Payer: Self-pay | Admitting: Nurse Practitioner

## 2023-11-14 NOTE — Telephone Encounter (Signed)
 Cmn received from [facility] for O2 supplies.

## 2023-11-15 ENCOUNTER — Ambulatory Visit (HOSPITAL_COMMUNITY)

## 2023-11-16 ENCOUNTER — Ambulatory Visit (HOSPITAL_COMMUNITY)
Admission: RE | Admit: 2023-11-16 | Discharge: 2023-11-16 | Disposition: A | Discharge status: Home / Self Care | Source: Ambulatory Visit | Attending: Nurse Practitioner | Admitting: Nurse Practitioner

## 2023-11-17 NOTE — Telephone Encounter (Signed)
 Oxygen has never been ordered by our office. Denying, sent back to [facility].

## 2023-11-20 ENCOUNTER — Ambulatory Visit (HOSPITAL_BASED_OUTPATIENT_CLINIC_OR_DEPARTMENT_OTHER): Admitting: Pulmonary Disease

## 2023-11-20 ENCOUNTER — Encounter (HOSPITAL_BASED_OUTPATIENT_CLINIC_OR_DEPARTMENT_OTHER): Payer: Self-pay | Admitting: Pulmonary Disease

## 2023-11-20 DIAGNOSIS — J84112 Idiopathic pulmonary fibrosis: Secondary | ICD-10-CM | POA: Diagnosis not present

## 2023-11-20 DIAGNOSIS — Z7722 Contact with and (suspected) exposure to environmental tobacco smoke (acute) (chronic): Secondary | ICD-10-CM

## 2023-11-20 DIAGNOSIS — J9611 Chronic respiratory failure with hypoxia: Secondary | ICD-10-CM | POA: Diagnosis not present

## 2023-11-20 NOTE — Patient Instructions (Addendum)
 X O2 Rx to DME  X Prednisone  10 mg tabs  Take 2 tabs daily with food x 10ds, then 1 tab daily with food x 10ds then STOP   YOUR PLAN:  -IDIOPATHIC PULMONARY FIBROSIS (IPF): Idiopathic pulmonary fibrosis is a lung disease that causes scarring of the lungs, leading to progressive difficulty in breathing. You will continue taking pirfenidone  to help slow the progression of the disease, although the exact timeline of its effectiveness is uncertain.  -CHRONIC RESPIRATORY FAILURE WITH HYPOXIA: Chronic respiratory failure with hypoxia means your lungs are not getting enough oxygen into your blood, especially when you move around. You will start using continuous oxygen therapy at home, including a large tank for use around the house and a portable tank for when you are walking. Please use the oxygen when sleeping and during ambulation.  -WEIGHT LOSS: You have experienced significant weight loss and decreased appetite. To help stimulate your appetite, you will start taking a low-dose prednisone . Additionally, please continue to drink protein supplements like Boost or Ensure, aiming for at least one can per day.

## 2023-11-20 NOTE — Progress Notes (Signed)
 Subjective:    Patient ID: Debra Jimenez, female    DOB: May 06, 1951, 73 y.o.   MRN: [id number]  HPI   73 yo remote smoker for follow-up of ILD, symptom onset since 2019 -New onset hypertension on lisinopril   She had 10% drop in FVC from 20 19-20 21 >> progressive phenotype.   she had disease progression on 100 mg twice daily on HRCT and drop in lung function.  Unfortunately did not tolerate full dose of Ofev  12/2021 Switched to Esbriet   She was hospitalized 5/15- 5/20 with O2 sat dropping to 79% on room air at home.  CTA chest negative for PE, shows severe fibrotic interstitial lung disease.  11/06/23 - Desaturated to 88% on room air, but dc'd without O2     presents with progressive respiratory symptoms and oxygen desaturation. She is accompanied by her sister.  She experiences oxygen desaturation to 79% upon ambulation, which improves with supplemental oxygen. At discharge, her saturation was 92% during a walk, but previously dropped to 82-83%. She has been on pirfenidone  for over a year, with progressive worsening of symptoms. Breathlessness now occurs with daily activities, whereas it was previously only with heavy exertion. Anxiety exacerbates her symptoms, particularly due to caregiving responsibilities for her husband with Alzheimer's disease.  Significant weight loss and decreased appetite are present. She uses protein drinks like Boost to supplement her nutrition   Significant tests/ events reviewed     HRCT  10/2022 >>  no progression HRCT 10/2021 >> Slightly worsened moderate pulmonary fibrosis , c/w UIP HRCT 10/2020 >> c/w UIP, increased compared to 2021, unchanged prominent LNs     HRCT 10/2019 "probable" UIP , basilar predominance, early honeycombing , mild mediastinal lymphadenopathy     PFTs 11/2022 >> FVC 38%, DLCO 7.1/36% PFTs 09/2021  FVC 1.11/35%, TLC 44%, DLCO 4.33/21%   PFTs 02/2021 -FVC 1.26/40%, TLC 3.45/66%, DLCO 7.83/38%   PFTs 12/2019 -FVC 1.47/46%, TLC  3.49/67%, DLCO 12.2/60%   PFTs 12/2017  -[facility] -moderate intraparenchymal restriction, ratio 88, FVC 56%/1.70, DLCO 8.7/44%, TLC 3.21/64%  Review of Systems neg for any significant sore throat, dysphagia, itching, sneezing, nasal congestion or excess/ purulent secretions, fever, chills, sweats, unintended wt loss, pleuritic or exertional cp, hempoptysis, orthopnea pnd or change in chronic leg swelling. Also denies presyncope, palpitations, heartburn, abdominal pain, nausea, vomiting, diarrhea or change in bowel or urinary habits, dysuria,hematuria, rash, arthralgias, visual complaints, headache, numbness weakness or ataxia.     Objective:   Physical Exam  Gen. Pleasant, thin, frail, in no distress ENT - no thrush, no pallor/icterus,no post nasal drip Neck: No JVD, no thyromegaly, no carotid bruits Lungs: no use of accessory muscles, no dullness to percussion, BB rales no rhonchi  Cardiovascular: Rhythm regular, heart sounds  normal, no murmurs or gallops, no peripheral edema Musculoskeletal: No deformities, no cyanosis or clubbing        Assessment & Plan:    Idiopathic pulmonary fibrosis (IPF) Progressive idiopathic pulmonary fibrosis on pirfenidone  (Esbriet ) with increased symptoms during daily activities over the past year. Current treatment with pirfenidone  aims to slow disease progression, though the exact timeline is uncertain.  Chronic respiratory failure with hypoxia Chronic respiratory failure with hypoxia, exacerbated by ambulation. Desaturation to 79% on ambulation, improved with oxygen. Disease progression requires continuous oxygen therapy as portable tanks may not suffice for future needs. - Order continuous oxygen therapy for home use, including a large tank for use around the house and a portable tank for ambulation. - Coordinate  with oxygen supply company to deliver oxygen equipment within 1-2 days. - Instruct to use oxygen when sleeping and during ambulation>>  3L POC with walking or 2L cont @ rest/sleep  Weight loss Significant weight loss with decreased appetite. Recent prednisone  use in the hospital improved appetite. Considering trial of low-dose prednisone  to stimulate appetite. - Prescribe low-dose prednisone  to stimulate appetite. - Encourage intake of protein drinks like Boost or Ensure, at least one can per day.

## 2023-11-21 ENCOUNTER — Telehealth (HOSPITAL_BASED_OUTPATIENT_CLINIC_OR_DEPARTMENT_OTHER): Payer: Self-pay

## 2023-11-21 DIAGNOSIS — J84112 Idiopathic pulmonary fibrosis: Secondary | ICD-10-CM

## 2023-11-21 DIAGNOSIS — J9611 Chronic respiratory failure with hypoxia: Secondary | ICD-10-CM

## 2023-11-21 MED ORDER — PREDNISONE 10 MG PO TABS: ORAL_TABLET | ORAL | 0 refills | Status: AC

## 2023-11-21 NOTE — Telephone Encounter (Signed)
 [place], Debra Jimenez  Bell, [name]; [place], Debra Jimenez; Darnella Elder; Able, Jada Hello Will need corrected 02 order stating POC @ a setting of 3 without the "L" due to unit not given continuous flow.  Thank you!  Please advise on message from DME

## 2023-11-21 NOTE — Addendum Note (Signed)
 Addended by: [place] L on: 11/21/2023 04:16 PM   Modules accepted: Orders

## 2023-11-21 NOTE — Telephone Encounter (Signed)
 DME order corrected

## 2023-11-21 NOTE — Telephone Encounter (Signed)
 Copied from CRM [id number]. Topic: Clinical - Medical Advice >> Nov 21, 2023 10:15 AM Hilton Lucky wrote: Reason for CRM: Patient is calling with questions for Dr. Villa Greaser she failed to bring up at recent appointment. Patient would like to know if she needs to continue nebulizer treatments after being placed on oxygen. Patient would also like to know if she needs to continue her inhaler after using oxygen.  Cancelled appointment for 06/12 for patient. >> Nov 21, 2023 11:36 AM Ilene Malling wrote: Patient [phone] states saw Dr. Villa Greaser yesterday and forget to ask some questions, patient called earlier today. Patient would like to know if she needs to continue nebulizer treatments and inhaler after being placed on oxygen? Also, patient states spoke with [facility] pharmacist today and Prednisone  was not sent. Please send meds and call back.   [facility] [place], [place] - [address] [place] [address] Phone: [phone] Fax: [phone]

## 2023-11-23 DIAGNOSIS — E039 Hypothyroidism, unspecified: Secondary | ICD-10-CM | POA: Diagnosis not present

## 2023-11-24 NOTE — Telephone Encounter (Signed)
 Pt has already received her oxygen and I notified her of neb use

## 2023-11-28 ENCOUNTER — Telehealth (HOSPITAL_BASED_OUTPATIENT_CLINIC_OR_DEPARTMENT_OTHER): Payer: Self-pay

## 2023-11-28 NOTE — Telephone Encounter (Signed)
 Copied from CRM [id number]. Topic: Clinical - Prescription Issue >> Nov 28, 2023  9:16 AM Debra Jimenez O wrote: Reason for EAV:[id number] is calling   was in to see dr Debra Jimenez on the 2nd and when i saw him he was ordering my oxygen and they came last week and install oxygen and i didnt receive a portable and need to talk to dr Debra Jimenez nurse  cause i need a portable oxygen and i cant not use the tanks . its much easier for us  to use a portable and i received the home unit and tanks but I am needing to get the portable oxygen also . Please give patient a call concerning this issue with the oxygen \ [phone] [employer] is the company patient got oxygen from

## 2023-11-29 ENCOUNTER — Telehealth (HOSPITAL_BASED_OUTPATIENT_CLINIC_OR_DEPARTMENT_OTHER): Payer: Self-pay

## 2023-11-29 NOTE — Telephone Encounter (Signed)
 Copied from CRM [id number]. Topic: Clinical - Order For Equipment >> Nov 29, 2023  3:04 PM Juliana Ocean wrote: Reason for CRM: Isobel Marie w/ [employer] calling to ask for POC. They need the documents for the titration testing. Cb  [phone] Fax [phone]

## 2023-11-30 ENCOUNTER — Ambulatory Visit: Admitting: Nurse Practitioner

## 2023-11-30 NOTE — Telephone Encounter (Signed)
 Spoke with Ivette Marks from [facility] regarding Mrs. Debra Jimenez. She is sending the authorization to [facility] and will include correct prescription, for the setting of 3. She will reach out to patient to schedule the delivery of Debra Jimenez POC. Ivette Marks confirmed there is nothing further needed from our end at this time. If anything else is required, she will reach back out to us .

## 2023-12-04 ENCOUNTER — Telehealth (HOSPITAL_BASED_OUTPATIENT_CLINIC_OR_DEPARTMENT_OTHER): Payer: Self-pay

## 2023-12-04 NOTE — Telephone Encounter (Signed)
 Copied from CRM [id number]. Topic: Clinical - Medication Question >> Dec 04, 2023  8:10 AM Hilton Lucky wrote: Reason for CRM: Patient has two questions she would like to have addressed this morning.   Patient states she is on prednisone  and is not sleeping at all on it. Patient would like to know if she can stop taking this or if there is an alternative for this. States has about 8 tablet left.   Patient states has also been rx'ed Zofran  and states it is not doing well. Patient would like an alternative to this one if possible for just nausea.

## 2023-12-04 NOTE — Telephone Encounter (Signed)
 Please advise on something different for Nausea as Zofran  is not working for pt

## 2023-12-07 ENCOUNTER — Other Ambulatory Visit: Payer: Self-pay

## 2023-12-07 DIAGNOSIS — J84112 Idiopathic pulmonary fibrosis: Secondary | ICD-10-CM | POA: Diagnosis not present

## 2023-12-07 DIAGNOSIS — E039 Hypothyroidism, unspecified: Secondary | ICD-10-CM | POA: Diagnosis not present

## 2023-12-07 DIAGNOSIS — J9601 Acute respiratory failure with hypoxia: Secondary | ICD-10-CM | POA: Diagnosis not present

## 2023-12-11 ENCOUNTER — Other Ambulatory Visit: Payer: Self-pay | Admitting: Nurse Practitioner

## 2023-12-13 ENCOUNTER — Telehealth (HOSPITAL_BASED_OUTPATIENT_CLINIC_OR_DEPARTMENT_OTHER): Payer: Self-pay

## 2023-12-13 NOTE — Telephone Encounter (Signed)
 Agree with reaching out to PCP.  Would resume the pantoprazole  if having worsening reflux symptoms. Thanks.

## 2023-12-13 NOTE — Telephone Encounter (Signed)
 Copied from CRM [id number]. Topic: General - Other >> Dec 13, 2023  9:55 AM Shona S wrote: Reason for CRM: patient would like to speak to a nurse in regards to issues she is having.

## 2023-12-19 DIAGNOSIS — E039 Hypothyroidism, unspecified: Secondary | ICD-10-CM | POA: Diagnosis not present

## 2023-12-19 DIAGNOSIS — Z681 Body mass index (BMI) 19 or less, adult: Secondary | ICD-10-CM | POA: Diagnosis not present

## 2023-12-19 DIAGNOSIS — I10 Essential (primary) hypertension: Secondary | ICD-10-CM | POA: Diagnosis not present

## 2023-12-19 DIAGNOSIS — J84112 Idiopathic pulmonary fibrosis: Secondary | ICD-10-CM | POA: Diagnosis not present

## 2023-12-21 ENCOUNTER — Other Ambulatory Visit (HOSPITAL_COMMUNITY): Payer: Self-pay

## 2023-12-27 ENCOUNTER — Other Ambulatory Visit: Payer: Self-pay

## 2023-12-29 ENCOUNTER — Other Ambulatory Visit: Payer: Self-pay

## 2024-01-01 ENCOUNTER — Other Ambulatory Visit: Payer: Self-pay | Admitting: Pharmacy Technician

## 2024-01-01 ENCOUNTER — Other Ambulatory Visit: Payer: Self-pay

## 2024-01-01 NOTE — Progress Notes (Signed)
 Specialty Pharmacy Refill Coordination Note  Debra Jimenez is a 73 y.o. female contacted today regarding refills of specialty medication(s) Pirfenidone   Patient reported surplus of medications after outreach have started. Patient stated she have 2 and half months of meds and request to reach out in the beginning of Oct for refills.  Patient will callback if needed sooner. Re-time patient to Oct.

## 2024-01-22 ENCOUNTER — Ambulatory Visit (HOSPITAL_BASED_OUTPATIENT_CLINIC_OR_DEPARTMENT_OTHER): Admitting: Primary Care

## 2024-01-22 ENCOUNTER — Encounter (HOSPITAL_BASED_OUTPATIENT_CLINIC_OR_DEPARTMENT_OTHER): Payer: Self-pay | Admitting: Primary Care

## 2024-01-22 VITALS — BP 155/86 | HR 87 | Ht 65.5 in | Wt 102.4 lb

## 2024-01-22 DIAGNOSIS — J9611 Chronic respiratory failure with hypoxia: Secondary | ICD-10-CM

## 2024-01-22 DIAGNOSIS — J84112 Idiopathic pulmonary fibrosis: Secondary | ICD-10-CM

## 2024-01-22 NOTE — Patient Instructions (Addendum)
  VISIT SUMMARY: Debra Jimenez, a 73 year old female with idiopathic pulmonary fibrosis, visited today due to increased shortness of breath. She requires more frequent use of supplemental oxygen, especially during physical activities, but manages well at rest. She is currently taking pirfenidone  for her condition. Additionally, she experienced significant insomnia from a recent trial of prednisone  for weight gain, which has since been discontinued.  YOUR PLAN: -IDIOPATHIC PULMONARY FIBROSIS: Idiopathic pulmonary fibrosis is a lung disease that causes scarring of the lungs, leading to difficulty in breathing. You should continue taking pirfenidone  801 mg three times a day with meals to help manage your condition. Your increased need for supplemental oxygen, especially during physical activities, is noted, but you are managing well at rest.  -INSOMNIA DUE TO PREDNISONE : Insomnia is difficulty falling or staying asleep. Your recent use of prednisone  for weight gain caused significant insomnia and was not effective for weight gain. Therefore, you should discontinue prednisone .  INSTRUCTIONS: >> Please continue taking pirfenidone  as prescribed and discontinue prednisone  >> I will ask Debra Jimenez if we need to do echocardiogram vs right heart cath to assess for pulmonary HTN related to ILD. >> We need to re-schedule breathing test. >> Continue to use 2L at rest/bedtime and 3L on pulsed concentrator.  >>Would like you to go pulmonary rehab exercise 3 times a week with your oxygen.  >> If your shortness of breath worsens or you have any concerns, please follow up with Debra Jimenez, your primary pulmonologist.  Follow-up Needs PFTs scheduled first available - ordered in April

## 2024-01-22 NOTE — Progress Notes (Signed)
 @Patient  ID: Debra Jimenez, female    DOB: 1951-04-18, 73 y.o.   MRN: [id number]  Chief Complaint  Patient presents with   Follow-up    IPF    Referring provider: Jeanette Comer BRAVO, *  HPI:  73 yo remote smoker for follow-up of ILD, symptom onset since 2019 -New onset hypertension on lisinopril   She had 10% drop in FVC from 20 19-20 21 >> progressive phenotype.   she had disease progression on 100 mg twice daily on HRCT and drop in lung function.  Unfortunately did not tolerate full dose of Ofev  12/2021 Switched to Esbriet   She was hospitalized 5/15- 5/20 with O2 sat dropping to 79% on room air at home.  CTA chest negative for PE, shows severe fibrotic interstitial lung disease.  11/06/23 - Desaturated to 88% on room air, but dc'd without O2    01/22/2024- Interim hx  Discussed the use of AI scribe software for clinical note transcription with the patient, who gave verbal consent to proceed.  History of Present Illness Debra Jimenez is a 73 year old female with fibrotic interstitial lung disease who presents for follow-up of her condition.  She has a history of fibrotic interstitial lung disease, with her most recent CT scan on Nov 02, 2023, showing severe fibrotic interstitial lung disease that was stable since the prior study on Oct 24, 2022. The CT scan was initially performed to rule out a blood clot. Her CT scans from May 2023 showed slight worsening of moderate pulmonary fibrosis, but since then, there has been no evidence of progression.  She started using oxygen therapy on November 20, 2023, following a prescription for continuous oxygen for home use. She uses a portable concentrator set at three liters when walking and two liters at rest or while sleeping. She sometimes struggles in the shower and has a setup to use oxygen during this activity.  She experiences coughing, particularly in the mornings, with clear mucus production. She has not used her nebulizer frequently as  she has not experienced significant symptoms beyond her baseline.  Her last breathing test was in 2024, and she was due for another in April 2025, which was not completed due to hospitalization. She has not had an echocardiogram in a couple of years.  She has previously participated in pulmonary rehabilitation at [facility] and continues to perform some exercises at home, primarily through housework. She does not attend classes but maintains some level of physical activity when the weather permits. She reports not doing much walking due to weather constraints.  She has experienced increased shortness of breath since her last visit, necessitating more frequent use of supplemental oxygen, particularly during physical activities. She can manage without oxygen for one to two hours while at rest, such as watching TV or conversing, but her oxygen levels drop with movement.  She is currently taking pirfenidone , 801 mg three times a day with meals, for her idiopathic pulmonary fibrosis. She previously tried prednisone  to aid in weight gain, taking two tablets for ten days with food, then one tablet, but discontinued after four doses due to significant insomnia and lack of efficacy in weight gain.  She has a history of smoking but has quit for several years.   Allergies  Allergen Reactions   Atorvastatin Other (See Comments)     Muscle aches. Has tried 2 different statins and unable to tolerate   Doxycycline Nausea And Vomiting    Intolerance due to perfenidone    Immunization  History  Administered Date(s) Administered   Fluad Quad(high Dose 65+) 06/27/2018, 02/18/2022   Influenza, High Dose Seasonal PF 04/05/2017   Influenza,inj,quad, With Preservative 02/19/2018   Moderna Sars-Covid-2 Vaccination 07/11/2019, 08/16/2019   Pneumococcal Conjugate-13 05/25/2016   Respiratory Syncytial Virus Vaccine,Recomb Aduvanted(Arexvy) 02/18/2022   Tdap 01/13/2010, 03/12/2018    Past Medical History:   Diagnosis Date   Anxiety    Hypothyroidism    Idiopathic pulmonary fibrosis (HCC)    OA (osteoarthritis)    RIGHT HIP AND LEFT HIP   Wears glasses     Tobacco History: Social History   Tobacco Use  Smoking Status Former   Current packs/day: 0.00   Average packs/day: 0.5 packs/day for 15.0 years (7.5 ttl pk-yrs)   Types: Cigarettes   Start date: 39   Quit date: 74   Years since quitting: 40.6  Smokeless Tobacco Never   Counseling given: Not Answered   Outpatient Medications Prior to Visit  Medication Sig Dispense Refill   albuterol  (PROVENTIL ) (2.5 MG/3ML) 0.083% nebulizer solution Take 3 mLs (2.5 mg total) by nebulization every 4 (four) hours as needed for wheezing or shortness of breath. 75 mL 12   albuterol  (VENTOLIN  HFA) 108 (90 Base) MCG/ACT inhaler Inhale 2 puffs into the lungs every 6 (six) hours as needed for wheezing or shortness of breath. 8 g 2   ALPRAZolam  (XANAX ) 0.25 MG tablet Take 0.25 mg by mouth daily as needed for anxiety or sleep.     ezetimibe (ZETIA) 10 MG tablet Take 10 mg by mouth daily.     guaiFENesin  (MUCINEX ) 600 MG 12 hr tablet Take 1 tablet (600 mg total) by mouth 2 (two) times daily. 60 tablet 2   levothyroxine  (SYNTHROID ) 137 MCG tablet Take 137 mcg by mouth every morning.     losartan (COZAAR) 50 MG tablet Take 50 mg by mouth daily.     Nebulizers (COMPRESSOR/NEBULIZER) MISC 1 Units by Does not apply route daily as needed. 1 each 0   ondansetron  (ZOFRAN ) 4 MG tablet TAKE 1 TABLET BY MOUTH TWICE DAILY AS NEEDED FOR NAUSEA AND VOMITING 30 tablet 1   OVER THE COUNTER MEDICATION Take 1-2 tablets by mouth daily. CBD gummies for appetite stimulation     pantoprazole  (PROTONIX ) 40 MG tablet Take 1 tablet (40 mg total) by mouth daily. 30 tablet 0   Pirfenidone  (ESBRIET ) 801 MG TABS Take 1 tablet (801 mg total) by mouth 3 (three) times daily with meals. 270 tablet 1   polyethylene glycol (MIRALAX  / GLYCOLAX ) 17 g packet Take 17 g by mouth daily as  needed for mild constipation.     predniSONE  (DELTASONE ) 10 MG tablet 2 tabs daily x 10 days with food then 1 tab daily x 10 days then STOP 30 tablet 0   zolpidem  (AMBIEN ) 5 MG tablet Take 5 mg by mouth at bedtime.     No facility-administered medications prior to visit.    Review of Systems  Review of Systems  Constitutional:  Positive for fatigue.  HENT: Negative.    Respiratory:  Positive for cough and shortness of breath. Negative for chest tightness and wheezing.   Cardiovascular: Negative.      Physical Exam  BP (!) 155/86   Pulse 87   Ht 5' 5.5 (1.664 m)   Wt 102 lb 6.4 oz (46.4 kg)   SpO2 95%   BMI 16.78 kg/m  Physical Exam Constitutional:      General: She is not in acute distress.  Appearance: Normal appearance. She is well-developed.     Comments: Underweight  HENT:     Head: Normocephalic and atraumatic.     Mouth/Throat:     Mouth: Mucous membranes are moist.     Pharynx: Oropharynx is clear.  Eyes:     Pupils: Pupils are equal, round, and reactive to light.  Cardiovascular:     Rate and Rhythm: Normal rate and regular rhythm.     Heart sounds: Normal heart sounds. No murmur heard. Pulmonary:     Effort: Pulmonary effort is normal. No respiratory distress.     Breath sounds: Rales present. No rhonchi.     Comments: 3L POC Abdominal:     General: Bowel sounds are normal.     Palpations: Abdomen is soft.     Tenderness: There is no abdominal tenderness.  Musculoskeletal:        General: Normal range of motion.     Cervical back: Normal range of motion and neck supple.  Skin:    General: Skin is warm and dry.     Findings: No erythema or rash.  Neurological:     General: No focal deficit present.     Mental Status: She is alert and oriented to person, place, and time. Mental status is at baseline.  Psychiatric:        Mood and Affect: Mood normal.        Behavior: Behavior normal.        Thought Content: Thought content normal.         Judgment: Judgment normal.      Lab Results:  CBC    Component Value Date/Time   WBC 7.4 11/02/2023 1758   RBC 4.54 11/02/2023 1758   HGB 13.6 11/02/2023 1758   HCT 42.8 11/02/2023 1758   PLT 201 11/02/2023 1758   MCV 94.3 11/02/2023 1758   MCH 30.0 11/02/2023 1758   MCHC 31.8 11/02/2023 1758   RDW 12.5 11/02/2023 1758   LYMPHSABS 0.8 11/02/2023 1758   MONOABS 0.7 11/02/2023 1758   EOSABS 0.3 11/02/2023 1758   BASOSABS 0.0 11/02/2023 1758    BMET    Component Value Date/Time   NA 137 11/02/2023 1758   NA 143 11/10/2021 1339   K 3.8 11/02/2023 1758   CL 99 11/02/2023 1758   CO2 29 11/02/2023 1758   GLUCOSE 126 (H) 11/02/2023 1758   BUN 22 11/02/2023 1758   BUN 16 11/10/2021 1339   CREATININE 0.72 11/02/2023 1758   CALCIUM 9.9 11/02/2023 1758   GFRNONAA >60 11/02/2023 1758   GFRAA >60 11/12/2019 1134    BNP    Component Value Date/Time   BNP 17.0 11/02/2023 1800    ProBNP No results found for: PROBNP  Imaging: No results found.   Assessment & Plan:    Assessment & Plan Idiopathic pulmonary fibrosis Fibrotic interstitial lung disease (pulmonary fibrosis) Severe fibrotic interstitial lung disease with interstitial thickening and honeycombing in the peripheral lungs. No progression on CT scans from May 2024 and May 2025, with slight worsening in May 2023. Pulmonary fibrosis increases the risk of pulmonary hypertension, potentially causing dyspnea and increased oxygen requirements. - Order echocardiogram if dyspnea worsens to assess for pulmonary hypertension. - Consider cardiology referral for right heart catheterization if echocardiogram is inconclusive. - Order pulmonary function test to assess lung volumes and diffusion capacity. - Encourage continuation of home exercises and consider virtual pulmonary rehab if needed. - Continue pirfenidone  801 mg three times a day with meals.  Chronic hypoxemic respiratory failure requiring supplemental  oxygen Chronic hypoxemic respiratory failure managed with supplemental oxygen. Continuous oxygen prescribed for home use with a portable concentrator. Recommended oxygen use at three liters on the portable concentrator when ambulating and two liters at rest or during sleep.  - Use three liters of oxygen on the portable concentrator when ambulating and two liters at rest or during sleep. - Use oxygen during showering and consider a shower chair to prevent hypoxemia. - Monitor for signs of hypoxemia and check tubing for kinks or disconnections if oxygen levels are low. - Use nebulizer as needed for dyspnea not relieved by rest or inhaler, or during respiratory infections.  Insomnia due to prednisone  Insomnia attributed to recent prednisone  use for weight gain. Prednisone  was not effective for weight gain and caused significant insomnia despite morning dosing. Discontinued prednisone  after four doses due to these side effects. - Discontinue prednisone  due to insomnia and lack of efficacy for weight gain.    Debra LELON Ferrari, NP 01/22/2024

## 2024-01-22 NOTE — Progress Notes (Signed)
 DME: SATURATION QUALIFICATIONS: (This note is used to comply with regulatory documentation for home oxygen)   Patient Saturations on Room Air at Rest = 95% 2 L continuous    Patient Saturations on 2 Liters of continuous oxygen while Ambulating = 97%   Please briefly explain why patient needs home oxygen: Pt can not maintain O2 levels above 90 % at rest without 2L of O2.

## 2024-01-31 ENCOUNTER — Other Ambulatory Visit: Payer: Self-pay

## 2024-02-14 ENCOUNTER — Telehealth: Payer: Self-pay | Admitting: Primary Care

## 2024-02-14 DIAGNOSIS — R0602 Shortness of breath: Secondary | ICD-10-CM

## 2024-02-14 DIAGNOSIS — J84112 Idiopathic pulmonary fibrosis: Secondary | ICD-10-CM

## 2024-02-14 DIAGNOSIS — J9611 Chronic respiratory failure with hypoxia: Secondary | ICD-10-CM

## 2024-02-14 NOTE — Telephone Encounter (Signed)
 Please let patient know I spoke with Dr. Jude nad he recommends getting echocardiogram rechecked. I will order

## 2024-02-14 NOTE — Telephone Encounter (Signed)
 Called the pt and there was no answer- LMTCB

## 2024-02-14 NOTE — Telephone Encounter (Signed)
-----   Message from [facility] sent at 02/04/2024  8:05 PM EDT ----- Regarding: RE: ILD Ok to recheck echo - was ok in 2023 ----- Message ----- From: Hope Almarie ORN, NP Sent: 01/22/2024   2:39 PM EDT To: Harden Jude GAILS, MD Subject: ILD                                            Patient of yours with progressive ILD. She feels more dyspneic, you started her on oxygen in June. Fibrosis appears stable on imaging since 2023. She is on Esbriet . Overdue for PFT, I will get these scheduled.   Do you want to consider echo or right heart cath to assess for PH-ILD?

## 2024-02-15 NOTE — Telephone Encounter (Signed)
 Called and spoke with patient, advised of response from [facility].  She verbalized understanding.  She stated she would like to have it done at [facility] as that is the closest hospital to her.  I let her know I would let the PCCs know as they would be scheduling.  Nothing further needed.

## 2024-02-22 ENCOUNTER — Ambulatory Visit (HOSPITAL_COMMUNITY)
Admission: RE | Admit: 2024-02-22 | Discharge: 2024-02-22 | Disposition: A | Discharge status: Home / Self Care | Source: Ambulatory Visit | Attending: Nurse Practitioner | Admitting: Nurse Practitioner

## 2024-02-22 ENCOUNTER — Ambulatory Visit (HOSPITAL_BASED_OUTPATIENT_CLINIC_OR_DEPARTMENT_OTHER)
Admission: RE | Admit: 2024-02-22 | Discharge: 2024-02-22 | Disposition: A | Discharge status: Home / Self Care | Source: Ambulatory Visit | Attending: Physician Assistant | Admitting: Physician Assistant

## 2024-02-22 DIAGNOSIS — I10 Essential (primary) hypertension: Secondary | ICD-10-CM

## 2024-02-22 DIAGNOSIS — J9611 Chronic respiratory failure with hypoxia: Secondary | ICD-10-CM

## 2024-02-22 DIAGNOSIS — R0602 Shortness of breath: Secondary | ICD-10-CM | POA: Diagnosis not present

## 2024-02-22 DIAGNOSIS — J84112 Idiopathic pulmonary fibrosis: Secondary | ICD-10-CM

## 2024-02-22 DIAGNOSIS — J8410 Pulmonary fibrosis, unspecified: Secondary | ICD-10-CM | POA: Diagnosis not present

## 2024-02-22 DIAGNOSIS — I700 Atherosclerosis of aorta: Secondary | ICD-10-CM | POA: Diagnosis not present

## 2024-02-22 LAB — ECHOCARDIOGRAM COMPLETE
Area-P 1/2: 1.5 cm2
S' Lateral: 1.8 cm

## 2024-02-22 NOTE — Progress Notes (Signed)
*  PRELIMINARY RESULTS* Echocardiogram 2D Echocardiogram has been performed.  Teresa Aida PARAS 02/22/2024, 11:28 AM

## 2024-03-06 ENCOUNTER — Other Ambulatory Visit: Payer: Self-pay | Admitting: Nurse Practitioner

## 2024-03-15 ENCOUNTER — Other Ambulatory Visit: Payer: Self-pay

## 2024-03-19 ENCOUNTER — Other Ambulatory Visit (HOSPITAL_COMMUNITY)

## 2024-03-19 ENCOUNTER — Other Ambulatory Visit: Payer: Self-pay

## 2024-03-19 NOTE — Progress Notes (Signed)
 Specialty Pharmacy Refill Coordination Note  Debra Jimenez is a 73 y.o. female contacted today regarding refills of specialty medication(s) Pirfenidone    Patient requested Delivery   Delivery date: 03/27/24   Verified address: [address]  [place] [address]   Medication will be filled on 03/26/24.

## 2024-03-26 ENCOUNTER — Ambulatory Visit (HOSPITAL_COMMUNITY)
Admission: RE | Admit: 2024-03-26 | Discharge: 2024-03-26 | Disposition: A | Discharge status: Home / Self Care | Source: Ambulatory Visit | Attending: Nurse Practitioner | Admitting: Nurse Practitioner

## 2024-03-26 ENCOUNTER — Other Ambulatory Visit: Payer: Self-pay

## 2024-03-26 DIAGNOSIS — J84112 Idiopathic pulmonary fibrosis: Secondary | ICD-10-CM | POA: Diagnosis not present

## 2024-03-26 LAB — PULMONARY FUNCTION TEST
DL/VA % pred: 68 %
DL/VA: 2.82 ml/min/mmHg/L
DLCO unc % pred: 21 %
DLCO unc: 4.24 ml/min/mmHg
FEF 25-75 Pre: 1.08 L/s
FEF2575-%Pred-Pre: 59 %
FEV1-%Pred-Pre: 37 %
FEV1-Pre: 0.86 L
FEV1FVC-%Pred-Pre: 117 %
FEV6-%Pred-Pre: 33 %
FEV6-Pre: 0.97 L
FEV6FVC-%Pred-Pre: 104 %
FVC-%Pred-Pre: 32 %
FVC-Pre: 0.97 L
Pre FEV1/FVC ratio: 89 %
Pre FEV6/FVC Ratio: 100 %

## 2024-03-28 ENCOUNTER — Ambulatory Visit: Payer: Self-pay | Admitting: Nurse Practitioner

## 2024-03-28 NOTE — Progress Notes (Signed)
 ATCx1, LVMTCB. Sent MyChart message. NFN.

## 2024-03-28 NOTE — Progress Notes (Signed)
 Decline in lung function and diffusion capacity compared to 1 year ago but relatively stable compared to 2 years ago. Follow up as scheduled

## 2024-04-04 ENCOUNTER — Ambulatory Visit (HOSPITAL_BASED_OUTPATIENT_CLINIC_OR_DEPARTMENT_OTHER): Admitting: Pulmonary Disease

## 2024-04-04 ENCOUNTER — Encounter (HOSPITAL_BASED_OUTPATIENT_CLINIC_OR_DEPARTMENT_OTHER): Payer: Self-pay | Admitting: Pulmonary Disease

## 2024-04-04 VITALS — BP 135/82 | HR 77 | Ht 65.5 in | Wt 101.6 lb

## 2024-04-04 DIAGNOSIS — E440 Moderate protein-calorie malnutrition: Secondary | ICD-10-CM | POA: Diagnosis not present

## 2024-04-04 DIAGNOSIS — K5909 Other constipation: Secondary | ICD-10-CM

## 2024-04-04 DIAGNOSIS — J9611 Chronic respiratory failure with hypoxia: Secondary | ICD-10-CM | POA: Diagnosis not present

## 2024-04-04 DIAGNOSIS — R110 Nausea: Secondary | ICD-10-CM

## 2024-04-04 DIAGNOSIS — Z9981 Dependence on supplemental oxygen: Secondary | ICD-10-CM

## 2024-04-04 DIAGNOSIS — J84112 Idiopathic pulmonary fibrosis: Secondary | ICD-10-CM | POA: Diagnosis not present

## 2024-04-04 DIAGNOSIS — J849 Interstitial pulmonary disease, unspecified: Secondary | ICD-10-CM

## 2024-04-04 DIAGNOSIS — K219 Gastro-esophageal reflux disease without esophagitis: Secondary | ICD-10-CM | POA: Diagnosis not present

## 2024-04-04 NOTE — Patient Instructions (Signed)
  VISIT SUMMARY: During your visit, we discussed the progression of your idiopathic pulmonary fibrosis and the associated symptoms you are experiencing. We reviewed your current medications and made some adjustments to better manage your condition. We also talked about your weight loss, appetite issues, and other symptoms like mucus production, constipation, and nausea. Additionally, we discussed the importance of advance care planning and the potential move to [place]  to be closer to your family.  YOUR PLAN: -IDIOPATHIC PULMONARY FIBROSIS WITH PROGRESSIVE DECLINE: Idiopathic pulmonary fibrosis is a lung disease that causes scarring of the lungs, leading to a decline in lung function. Your lung function has decreased over the past year. We discussed a new medication, JASCAYD, which has shown promising results in clinical trials but has a potential side effect of diarrhea. We will start the approval process for JASCAYD and provide you with more information to review.  -CHRONIC RESPIRATORY FAILURE WITH HYPOXIA ON SUPPLEMENTAL OXYGEN: Chronic respiratory failure with hypoxia means your lungs are not getting enough oxygen into your blood. You are currently using supplemental oxygen at 3 liters per minute, but you may need to increase it to 4 liters during exertion to keep your oxygen levels above 90%. Please continue to monitor your oxygen saturation levels regularly.  -WEIGHT LOSS AND POOR APPETITE: You have experienced significant weight loss and poor appetite. We recommend starting a multivitamin to help improve your appetite and continuing with nutritional supplements like Boost.  -CHRONIC MUCUS PRODUCTION: You have increased mucus production, which you manage with Mucinex . Please continue taking Mucinex  twice daily.  -CHRONIC CONSTIPATION: You have chronic constipation, which you manage effectively with daily Miralax . Please continue taking Miralax  daily.  -GASTROESOPHAGEAL REFLUX DISEASE WITHOUT  ESOPHAGITIS: Gastroesophageal reflux disease (GERD) is a condition where stomach acid frequently flows back into the tube connecting your mouth and stomach. You are managing this with Protonix , which is effective. Please continue taking Protonix  daily.  -CHRONIC NAUSEA: You experience chronic nausea, which you manage with Zofran  as needed. Please continue using Zofran  as needed for nausea.  -ADVANCE CARE PLANNING AND PALLIATIVE CARE REFERRAL: Advance care planning involves making decisions about the care you would want to receive if you become unable to speak for yourself. We discussed your wishes regarding life support measures and agreed to refer you to palliative care for further discussions and planning.  INSTRUCTIONS: We will start the approval process for JASCAYD and provide you with more information to review. Please continue monitoring your oxygen saturation levels and adjust your oxygen use as needed. Start taking a multivitamin to help with your appetite and continue using nutritional supplements like Boost. Continue with y our current medications: Mucinex  twice daily, Miralax  daily, Protonix  daily, and Zofran  as needed. We will refer you to palliative care for advance care planning discussions.                      Contains text generated by [employer]bridge.                                 Contains text generated by Abridge.

## 2024-04-04 NOTE — Progress Notes (Signed)
 Subjective:    Patient ID: Debra Jimenez, female    DOB: 01/14/1951, 73 y.o.   MRN: [id number]   73 yo remote smoker for follow-up of ILD, symptom onset since 2019 -New onset hypertension on lisinopril   She had 10% drop in FVC from 20 19-20 21 >> progressive phenotype.   she had disease progression on 100 mg twice daily on HRCT and drop in lung function.  Unfortunately did not tolerate full dose of Ofev  12/2021 Switched to Esbriet    She was hospitalized 5/15- 5/20 with O2 sat dropping to 79% on room air at home.  CTA chest negative for PE, shows severe fibrotic interstitial lung disease.  11/06/23 - Desaturated to 88% on room air, but dc'd without O2    Discussed the use of AI scribe software for clinical note transcription with the patient, who gave verbal consent to proceed.  History of Present Illness Debra Jimenez is a 73 year old female with idiopathic pulmonary fibrosis who presents for follow-up.  She has been on pirfenidone  for two years. Over the past year, her forced vital capacity has decreased from 38% to 32%, while her diffusing capacity of the lungs for carbon monoxide remains stable at 4.24. She experiences increased mucus production in her throat and mouth, leading to frequent spitting, and manages this with Mucinex  twice daily.  Her oxygen saturation drops to 87% when walking, despite using oxygen at a setting of three liters per minute. She feels comfortable using three liters per minute and adjusts her oxygen use based on activity level.  She has a decrease in appetite and difficulty gaining weight despite consuming three small meals a day and using nutritional supplements like Boost. Her current weight is 101 pounds. She does not feel like she has acid reflux but experiences indigestion. She takes Protonix  daily for acid reflux and Zofran  as needed for nausea.  She experiences constipation, which she manages with daily Miralax . She recalls that a previous medication  caused diarrhea, leading to its discontinuation.  She manages household tasks and has groceries delivered. She is considering moving to [place]  to live with her daughter.     Significant tests/ events reviewed     HRCT 02/2024 >> severe pulm fibrosis, UIP worse  HRCT  10/2022 >>  no progression HRCT 10/2021 >> Slightly worsened moderate pulmonary fibrosis , c/w UIP HRCT 10/2020 >> c/w UIP, increased compared to 2021, unchanged prominent LNs     HRCT 10/2019 probable UIP , basilar predominance, early honeycombing , mild mediastinal lymphadenopathy     PFTs 11/2022 >> FVC 38%, DLCO 7.1/36% PFTs 09/2021  FVC 1.11/35%, TLC 44%, DLCO 4.33/21%   PFTs 02/2021 -FVC 1.26/40%, TLC 3.45/66%, DLCO 7.83/38%   PFTs 12/2019 -FVC 1.47/46%, TLC 3.49/67%, DLCO 12.2/60%   PFTs 12/2017  -[facility] -moderate intraparenchymal restriction, ratio 88, FVC 56%/1.70, DLCO 8.7/44%, TLC 3.21/64%  Review of Systems  neg for any significant sore throat, dysphagia, itching, sneezing, nasal congestion or excess/ purulent secretions, fever, chills, sweats, unintended wt loss, pleuritic or exertional cp, hempoptysis, orthopnea pnd or change in chronic leg swelling. Also denies presyncope, palpitations, heartburn, abdominal pain, nausea, vomiting, diarrhea or change in bowel or urinary habits, dysuria,hematuria, rash, arthralgias, visual complaints, headache, numbness weakness or ataxia.      Objective:   Physical Exam  Gen. Pleasant, thin woman in no distress, on [place] [facility] - no thrush, no pallor/icterus,no post nasal drip Neck: No JVD, no thyromegaly, no carotid bruits Lungs: no use of accessory  muscles, no dullness to percussion, BB rales no rhonchi  Cardiovascular: Rhythm regular, heart sounds  normal, no murmurs or gallops, no peripheral edema Musculoskeletal: No deformities, no cyanosis or clubbing        Assessment & Plan:   Assessment and Plan Assessment & Plan Idiopathic pulmonary fibrosis with  progressive decline Progressive decline in lung function with a drop in FVC from 38% to 32% over the past year. DLCO remains steady. Currently on pirfenidone , which is well-tolerated. Discussed JASCAYD, which shows promising results in clinical trials when added to existing treatment. Main side effect is diarrhea, with a 40% incidence, but improved survival rates. She is hesitant due to potential side effects but is considering the option. - Provide information on JASCAYD for her to review - Start approval process for JASCAYD  Chronic respiratory failure with hypoxia on supplemental oxygen Chronic respiratory failure with hypoxia managed with supplemental oxygen. Oxygen saturation drops to 87% with exertion, recovering with rest. Currently using 3L of oxygen, with potential need to increase to 4L during exertion. Importance of maintaining oxygen saturation above 90% and potential need for continuous oxygen discussed. - Continue supplemental oxygen at 3L, increase to 4L during exertion if needed - Monitor oxygen saturation levels regularly  Weight loss and poor appetite Significant weight loss with current weight at 101 lbs. Poor appetite despite efforts to eat three meals a day and use of nutritional supplements like Boost. No clear link between pirfenidone  and weight loss, though nausea and bloating are known side effects. Discussed potential benefit of a multivitamin to improve appetite. - Start multivitamin - Continue nutritional supplements like Boost  Chronic mucus production Increased mucus production with frequent spitting up, managed with Mucinex  twice daily. - Continue Mucinex  twice daily  Chronic constipation Chronic constipation managed with daily Miralax , which is effective. No direct link to pirfenidone , which is more commonly associated with nausea and bloating. - Continue daily Miralax   Gastroesophageal reflux disease without esophagitis GERD managed with Protonix , which is  effective. Previous attempt to discontinue Protonix  led to significant symptoms, indicating the need for continued use. - Continue Protonix  daily  Chronic nausea Chronic nausea managed with Zofran  as needed. No current nausea reported, so Zofran  use is minimal. - Use Zofran  as needed for nausea  Advance care planning and palliative care referral Discussed the importance of advance care planning given the progressive nature of her condition. She has expressed wishes regarding life support measures and discussed these with her family. Considering a move to [place]  to be closer to family. Agreed to a referral to palliative care for further discussion and planning. - Refer to palliative care for advance care planning discussions   Total time x 46m

## 2024-04-09 DIAGNOSIS — G4700 Insomnia, unspecified: Secondary | ICD-10-CM | POA: Diagnosis not present

## 2024-04-09 DIAGNOSIS — Z23 Encounter for immunization: Secondary | ICD-10-CM | POA: Diagnosis not present

## 2024-04-09 DIAGNOSIS — J84112 Idiopathic pulmonary fibrosis: Secondary | ICD-10-CM | POA: Diagnosis not present

## 2024-04-09 DIAGNOSIS — E785 Hyperlipidemia, unspecified: Secondary | ICD-10-CM | POA: Diagnosis not present

## 2024-04-09 DIAGNOSIS — Z681 Body mass index (BMI) 19 or less, adult: Secondary | ICD-10-CM | POA: Diagnosis not present

## 2024-04-09 DIAGNOSIS — E039 Hypothyroidism, unspecified: Secondary | ICD-10-CM | POA: Diagnosis not present

## 2024-04-09 DIAGNOSIS — I10 Essential (primary) hypertension: Secondary | ICD-10-CM | POA: Diagnosis not present

## 2024-04-11 ENCOUNTER — Other Ambulatory Visit: Payer: Self-pay

## 2024-04-15 ENCOUNTER — Telehealth: Payer: Self-pay

## 2024-04-15 DIAGNOSIS — J84112 Idiopathic pulmonary fibrosis: Secondary | ICD-10-CM

## 2024-04-15 DIAGNOSIS — J849 Interstitial pulmonary disease, unspecified: Secondary | ICD-10-CM

## 2024-04-15 NOTE — Telephone Encounter (Signed)
 Received notification from [employer] Teams message that Dr. Jude entered referral to pharmacotherapy clinic for this patient on 04/04/24. Referral was not visible due to queue not yet live at time of referral. Referral re-entered.  Will start benefits investigation in this encounter for Debra Jimenez.

## 2024-04-15 NOTE — Telephone Encounter (Signed)
 Copied from CRM #[id number]. Topic: Clinical - Prescription Issue >> Apr 15, 2024 12:25 PM Rilla B wrote: Reason for CRM: Patient has a couple boxes of medication, one expired 12/2023 and one 12/2023. Patient wants to know if it's okay to still use.  Please call patient @ [phone].

## 2024-04-15 NOTE — Telephone Encounter (Signed)
 Submitted a Prior Authorization request to [employer] for JASCAYD via CoverMyMeds. Will update once we receive a response.  Key: [id number]

## 2024-04-15 NOTE — Telephone Encounter (Signed)
 Patient advised not to take any expired medications. She has been advised to take bottles to drop-off box at local pharmacy. She verbalized understanding. NFN  Jeremiyah Cullens, PharmD, MPH, BCPS, CPP Clinical Pharmacist [facility])

## 2024-04-17 NOTE — Telephone Encounter (Signed)
 Received notification from [facility] regarding a prior authorization for JASCAYD. Authorization has been APPROVED from 04/15/24 to 06/19/25. Approval letter sent to scan center.  Unable to run test claim because medication is not built into [facility] yet.   Patient can fill through [facility]: [phone]   Authorization # [id number] Phone # [phone]   Submitted Jascayd enrollment form online through [employer] portal. Their team will reach out to the pt to obtain signature and triage rx.

## 2024-04-18 ENCOUNTER — Other Ambulatory Visit: Payer: Self-pay

## 2024-04-19 ENCOUNTER — Other Ambulatory Visit: Payer: Self-pay

## 2024-04-19 ENCOUNTER — Other Ambulatory Visit: Payer: Self-pay | Admitting: Pharmacy Technician

## 2024-04-19 NOTE — Progress Notes (Signed)
 Specialty Pharmacy Ongoing Clinical Assessment Note  Debra Jimenez is a 73 y.o. female who is being followed by the specialty pharmacy service for RxSp Interstitial Lung Disease   Patient's specialty medication(s) reviewed today: Pirfenidone    Missed doses in the last 4 weeks: 0   Patient/Caregiver did not have any additional questions or concerns.   Therapeutic benefit summary: Patient is achieving benefit   Adverse events/side effects summary: No adverse events/side effects   Patient's therapy is appropriate to: Continue    Goals Addressed             This Visit's Progress    Maintain optimal adherence to therapy   On track    Patient is on track. Patient will maintain adherence and adhere to provider and/or lab appointments         Follow up: 12 months  [facility] Specialty Pharmacist

## 2024-04-19 NOTE — Progress Notes (Signed)
 Specialty Pharmacy Refill Coordination Note  Debra Jimenez is a 73 y.o. female contacted today regarding refills of specialty medication(s) Pirfenidone    Patient requested Delivery   Delivery date: 04/24/24   Verified address: [address]  [place]   Medication will be filled on: 04/23/24

## 2024-04-22 ENCOUNTER — Other Ambulatory Visit: Payer: Self-pay

## 2024-04-25 ENCOUNTER — Other Ambulatory Visit (HOSPITAL_COMMUNITY): Payer: Self-pay

## 2024-04-25 NOTE — Telephone Encounter (Signed)
 Received fax from [facility] stating rx has been triaged to [facility]. Now able to run test claim, per claim copay for 30 day supply is $423.06. Pt is already enrolled in Pulmonary Fibrosis Grant through [employer]. I was advised by [facility] drug reps that [employer] has added Jascayd to their list to be covered under this grant but test claim was unsuccessful saying drug is not covered.   Called [employer] at [phone] and was advised they do not have the drug on their list as of yet, at least for this pt. Unsure if it's because she was already enrolled in the grant maybe it wasn't updated. The rep said she will fax a TCS form to request the drug be added. Completed form and faxed back to [employer]. Rep stated it may take a few weeks to process.  Fax #: [phone]   Called pt to advise her of copay and that I'm trying to get drug covered under her grant. She stated she has struggled with the side effects of pirfenidone  and is not sure if she even wants to start this medication. I advised her I would notify Dr. Jude and Aleck who may reach out to her to discuss further.

## 2024-04-30 NOTE — Telephone Encounter (Signed)
 ATC patient to discuss her concerns about side effects - LVM at [phone] with my direct line to return call.

## 2024-05-02 NOTE — Telephone Encounter (Signed)
 Second ATC patient to discuss her concerns. LVMTCB

## 2024-05-02 NOTE — Telephone Encounter (Signed)
 Patient returned my call.   She reports she has decreased appetite, no appetite and has to force herself to eat, and nausea on pirfenidone . She is hesitant to start Jascayd due to she does not want to add another medication and also concerned about additional side effects.   After discussion, she ultimately declines to try Jascayd at this time. She can follow-up with Dr. Jude or APP to discuss alternative treatment options, such as switch to Jascayd, if she and provider feel a treatment change is warranted and if she is unwilling to add Jascayd to regimen in January. She said she would re-consider Jascayd after the holidays.   She also asks about pantoprazole  and what it is used for. She is unsure why she is taking pantoprazole . Reports it was started during a hospitalization, then renewed afterwards but she isn't sure why she is taking it. Denies acid reflux. Denies other GI-related diagnoses. Discussed possible risk of worsened acid reflux with abrupt discontinuation, so she may discuss with her PCP if she desires to taper off with a lower dose before trialing off therapy.   Plan to follow-up with APP in January regarding IPF and antifibrotic regimen. Patient ultimately declines any changes at this time. Routing to team for FYI.

## 2024-05-15 ENCOUNTER — Other Ambulatory Visit: Payer: Self-pay

## 2024-05-15 ENCOUNTER — Other Ambulatory Visit: Payer: Self-pay | Admitting: Pulmonary Disease

## 2024-05-15 ENCOUNTER — Other Ambulatory Visit (HOSPITAL_COMMUNITY): Payer: Self-pay

## 2024-05-15 DIAGNOSIS — J84112 Idiopathic pulmonary fibrosis: Secondary | ICD-10-CM

## 2024-05-15 DIAGNOSIS — Z5181 Encounter for therapeutic drug level monitoring: Secondary | ICD-10-CM

## 2024-05-15 MED ORDER — PIRFENIDONE 801 MG PO TABS
801.0000 mg | ORAL_TABLET | Freq: Three times a day (TID) | ORAL | 3 refills | Status: AC
  Filled 2024-05-15 – 2024-06-27 (×5): qty 90, 30d supply, fill #0
  Filled 2024-07-24: qty 90, 30d supply, fill #1

## 2024-05-15 NOTE — Telephone Encounter (Signed)
 Refill sent for ESBRIET  to [facility]: [phone]   Dose: 801mg  by mouth three times daily   Last OV: 04/04/24 Provider: Dr. Jude Pertinent labs: LFTs wnl 09/07/2023  Next OV: 07/03/2024  Debra Jimenez, PharmD, BCPS Clinical Pharmacist  [facility]

## 2024-05-15 NOTE — Telephone Encounter (Signed)
 Pirfenidone  refill

## 2024-05-17 ENCOUNTER — Other Ambulatory Visit: Payer: Self-pay

## 2024-05-20 ENCOUNTER — Other Ambulatory Visit: Payer: Self-pay

## 2024-05-20 ENCOUNTER — Other Ambulatory Visit (HOSPITAL_COMMUNITY): Payer: Self-pay

## 2024-06-04 ENCOUNTER — Telehealth: Payer: Self-pay

## 2024-06-04 NOTE — Telephone Encounter (Signed)
 Called Debra Jimenez back and confirmed pt has declined to start therapy at this time. NFN

## 2024-06-04 NOTE — Telephone Encounter (Signed)
 Copied from CRM #[id number]. Topic: Clinical - Medication Question >> Jun 04, 2024  1:23 PM Rozanna MATSU wrote: Reason for CRM: Lauraine with [facility] [phone] would like Deidre to return her call, stated she has questions regarding this pt. Thanks  Please advise, I believe she is referring to Debra Jimenez.

## 2024-06-21 ENCOUNTER — Other Ambulatory Visit: Payer: Self-pay

## 2024-06-25 ENCOUNTER — Other Ambulatory Visit: Payer: Self-pay

## 2024-06-27 ENCOUNTER — Other Ambulatory Visit: Payer: Self-pay

## 2024-06-27 NOTE — Progress Notes (Signed)
 Specialty Pharmacy Refill Coordination Note  Debra Jimenez is a 74 y.o. female contacted today regarding refills of specialty medication(s) Pirfenidone    Patient requested Delivery   Delivery date: 07/02/24   Verified address: [address]  [place]   Medication will be filled on: 07/01/24   Filling before Grant expiration on 07/03/24.

## 2024-06-28 ENCOUNTER — Other Ambulatory Visit (HOSPITAL_COMMUNITY): Payer: Self-pay

## 2024-06-28 NOTE — Progress Notes (Signed)
 Pt's grant will expire on 07/03/24. Will attempt to reenroll her once it expires. Sent message to pt notifying her

## 2024-07-01 ENCOUNTER — Other Ambulatory Visit: Payer: Self-pay

## 2024-07-03 ENCOUNTER — Ambulatory Visit (HOSPITAL_BASED_OUTPATIENT_CLINIC_OR_DEPARTMENT_OTHER)

## 2024-07-03 ENCOUNTER — Encounter (HOSPITAL_BASED_OUTPATIENT_CLINIC_OR_DEPARTMENT_OTHER): Payer: Self-pay

## 2024-07-03 VITALS — BP 137/70 | HR 73 | Ht 65.5 in | Wt 102.6 lb

## 2024-07-03 DIAGNOSIS — Z87891 Personal history of nicotine dependence: Secondary | ICD-10-CM

## 2024-07-03 DIAGNOSIS — J84112 Idiopathic pulmonary fibrosis: Secondary | ICD-10-CM

## 2024-07-03 DIAGNOSIS — R110 Nausea: Secondary | ICD-10-CM | POA: Diagnosis not present

## 2024-07-03 DIAGNOSIS — J449 Chronic obstructive pulmonary disease, unspecified: Secondary | ICD-10-CM | POA: Diagnosis not present

## 2024-07-03 DIAGNOSIS — T50905A Adverse effect of unspecified drugs, medicaments and biological substances, initial encounter: Secondary | ICD-10-CM | POA: Diagnosis not present

## 2024-07-03 DIAGNOSIS — J9611 Chronic respiratory failure with hypoxia: Secondary | ICD-10-CM

## 2024-07-03 DIAGNOSIS — R0602 Shortness of breath: Secondary | ICD-10-CM

## 2024-07-03 DIAGNOSIS — Z5181 Encounter for therapeutic drug level monitoring: Secondary | ICD-10-CM

## 2024-07-03 LAB — HEPATIC FUNCTION PANEL
ALT: 7 IU/L (ref 0–32)
AST: 14 IU/L (ref 0–40)
Albumin: 4.5 g/dL (ref 3.8–4.8)
Alkaline Phosphatase: 79 IU/L (ref 49–135)
Bilirubin Total: 0.5 mg/dL (ref 0.0–1.2)
Bilirubin, Direct: 0.16 mg/dL (ref 0.00–0.40)
Total Protein: 7.3 g/dL (ref 6.0–8.5)

## 2024-07-03 MED ORDER — ARFORMOTEROL TARTRATE 15 MCG/2ML IN NEBU: 15.0000 ug | INHALATION_SOLUTION | Freq: Two times a day (BID) | RESPIRATORY_TRACT | 6 refills | Status: AC

## 2024-07-03 MED ORDER — ALBUTEROL SULFATE (2.5 MG/3ML) 0.083% IN NEBU: 2.5000 mg | INHALATION_SOLUTION | RESPIRATORY_TRACT | 12 refills | Status: AC | PRN

## 2024-07-03 NOTE — Progress Notes (Signed)
 "  @Patient  ID: Debra Jimenez, female    DOB: 26-Apr-1951, 74 y.o.   MRN: [id number]  Chief Complaint  Patient presents with   Interstitial Lung Disease    Referring provider: Skillman, Katherine E, PA-C  HPI: Discussed the use of AI scribe software for clinical note transcription with the patient, who gave verbal consent to proceed.  History of Present Illness Debra Jimenez is a 74 year old female with idiopathic pulmonary fibrosis who presents with increased shortness of breath and weakness.  Over the past two to three months, she has experienced increased weakness and shortness of breath, significantly restricting her activities despite using supplemental oxygen. Tasks that were previously manageable, such as making a bed, now induce shortness of breath even with oxygen.  She has both a puffer and a nebulizer for albuterol  but has not been using them consistently due to confusion and difficulty with the devices. She attempted to use the inhaler once but felt she was not receiving the mist, especially when using a spacer. She has not tried the inhaler or neb for the dyspnea.  She has been on Esbriet  since 2023 and experiences side effects such as nausea, which she manages with Odansetron, leading to constipation. These side effects impact her appetite and energy levels, making weight gain difficult. She consumes Boost and attempts to eat two good meals a day, though she often lacks appetite.  She is cautious about adding other medications to her regimen.  She is a former smoker, having smoked for a few years, and her husband also smoked, though they quit together. She has not been on any other inhalers for COPD and has not used prednisone  recently due to side effects. Prior lung function tests showed 12% improvement with bronchodilator.  Her last imaging was in September, which was compared to a CT scan in May 2024. No recent liver function tests, though one was ordered in  November.   Last OV 04/04/2024: 74 yo remote smoker for follow-up of ILD, symptom onset since 2019 -New onset hypertension on lisinopril   She had 10% drop in FVC from 20 19-20 21 >> progressive phenotype.   she had disease progression on 100 mg twice daily on HRCT and drop in lung function.  Unfortunately did not tolerate full dose of Ofev  12/2021 Switched to Esbriet    She was hospitalized 5/15- 5/20 with O2 sat dropping to 79% on room air at home.  CTA chest negative for PE, shows severe fibrotic interstitial lung disease.  11/06/23 - Desaturated to 88% on room air, but dc'd without O2  Debra Jimenez is a 74 year old female with idiopathic pulmonary fibrosis who presents for follow-up.   She has been on pirfenidone  for two years. Over the past year, her forced vital capacity has decreased from 38% to 32%, while her diffusing capacity of the lungs for carbon monoxide remains stable at 4.24. She experiences increased mucus production in her throat and mouth, leading to frequent spitting, and manages this with Mucinex  twice daily.   Her oxygen saturation drops to 87% when walking, despite using oxygen at a setting of three liters per minute. She feels comfortable using three liters per minute and adjusts her oxygen use based on activity level.   She has a decrease in appetite and difficulty gaining weight despite consuming three small meals a day and using nutritional supplements like Boost. Her current weight is 101 pounds. She does not feel like she has acid reflux but experiences indigestion. She takes  Protonix  daily for acid reflux and Zofran  as needed for nausea.   She experiences constipation, which she manages with daily Miralax . She recalls that a previous medication caused diarrhea, leading to its discontinuation.   She manages household tasks and has groceries delivered. She is considering moving to [place]  to live with her daughter.   Significant tests/ events reviewed      HRCT 02/2024 >> severe pulm fibrosis, UIP worse  HRCT  10/2022 >>  no progression HRCT 10/2021 >> Slightly worsened moderate pulmonary fibrosis , c/w UIP HRCT 10/2020 >> c/w UIP, increased compared to 2021, unchanged prominent LNs     HRCT 10/2019 probable UIP , basilar predominance, early honeycombing , mild mediastinal lymphadenopathy     PFTs 11/2022 >> FVC 38%, DLCO 7.1/36% PFTs 09/2021  FVC 1.11/35%, TLC 44%, DLCO 4.33/21%   PFTs 02/2021 -FVC 1.26/40%, TLC 3.45/66%, DLCO 7.83/38%   PFTs 12/2019 -FVC 1.47/46%, TLC 3.49/67%, DLCO 12.2/60%   PFTs 12/2017  -[facility] -moderate intraparenchymal restriction, ratio 88, FVC 56%/1.70, DLCO 8.7/44%, TLC 3.21/64% TEST/EVENTS :   Allergies[1]  Immunization History  Administered Date(s) Administered   Fluad Quad(high Dose 65+) 06/27/2018, 02/18/2022   INFLUENZA, HIGH DOSE SEASONAL PF 04/05/2017   Influenza,inj,quad, With Preservative 02/19/2018   Moderna Sars-Covid-2 Vaccination 07/11/2019, 08/16/2019   Pneumococcal Conjugate-13 05/25/2016   Respiratory Syncytial Virus Vaccine,Recomb Aduvanted(Arexvy) 02/18/2022   Tdap 01/13/2010, 03/12/2018    Past Medical History:  Diagnosis Date   Anxiety    Hypothyroidism    Idiopathic pulmonary fibrosis (HCC)    OA (osteoarthritis)    RIGHT HIP AND LEFT HIP   Wears glasses     Tobacco History: Tobacco Use History[2] Counseling given: Not Answered   Outpatient Medications Prior to Visit  Medication Sig Dispense Refill   albuterol  (VENTOLIN  HFA) 108 (90 Base) MCG/ACT inhaler Inhale 2 puffs into the lungs every 6 (six) hours as needed for wheezing or shortness of breath. 8 g 2   clonazePAM (KLONOPIN) 0.5 MG tablet Take 0.5 mg by mouth 2 (two) times daily.     ezetimibe (ZETIA) 10 MG tablet Take 10 mg by mouth daily.     FLUoxetine (PROZAC) 20 MG capsule Take 20 mg by mouth daily.     guaiFENesin  (MUCINEX ) 600 MG 12 hr tablet Take 1 tablet (600 mg total) by mouth 2 (two) times daily. 60  tablet 2   levothyroxine  (SYNTHROID ) 137 MCG tablet Take 137 mcg by mouth every morning.     losartan (COZAAR) 50 MG tablet Take 50 mg by mouth daily.     Nebulizers (COMPRESSOR/NEBULIZER) MISC 1 Units by Does not apply route daily as needed. 1 each 0   ondansetron  (ZOFRAN ) 4 MG tablet TAKE 1 TABLET BY MOUTH TWICE DAILY AS NEEDED FOR NAUSEA AND VOMITING 30 tablet 1   pantoprazole  (PROTONIX ) 40 MG tablet Take 1 tablet (40 mg total) by mouth daily. 30 tablet 0   Pirfenidone  (ESBRIET ) 801 MG TABS Take 1 tablet (801 mg total) by mouth 3 (three) times daily with meals. 90 tablet 3   polyethylene glycol (MIRALAX  / GLYCOLAX ) 17 g packet Take 17 g by mouth daily as needed for mild constipation.     zolpidem  (AMBIEN ) 5 MG tablet Take 5 mg by mouth at bedtime.     albuterol  (PROVENTIL ) (2.5 MG/3ML) 0.083% nebulizer solution Take 3 mLs (2.5 mg total) by nebulization every 4 (four) hours as needed for wheezing or shortness of breath. 75 mL 12   OVER THE COUNTER MEDICATION  Take 1-2 tablets by mouth daily. CBD gummies for appetite stimulation     predniSONE  (DELTASONE ) 10 MG tablet 2 tabs daily x 10 days with food then 1 tab daily x 10 days then STOP 30 tablet 0   No facility-administered medications prior to visit.     Review of Systems: as per hpi  Constitutional:   No  weight loss, night sweats,  Fevers, chills, fatigue, or  lassitude.  HEENT:   No headaches,  Difficulty swallowing,  Tooth/dental problems, or  Sore throat,                No sneezing, itching, ear ache, nasal congestion, post nasal drip,   CV:  No chest pain,  Orthopnea, PND, swelling in lower extremities, anasarca, dizziness, palpitations, syncope.   GI  No heartburn, indigestion, abdominal pain, nausea, vomiting, diarrhea, change in bowel habits, loss of appetite, bloody stools.   Resp: No shortness of breath with exertion or at rest.  No excess mucus, no productive cough,  No non-productive cough,  No coughing up of blood.  No  change in color of mucus.  No wheezing.  No chest wall deformity  Skin: no rash or lesions.  GU: no dysuria, change in color of urine, no urgency or frequency.  No flank pain, no hematuria   MS:  No joint pain or swelling.  No decreased range of motion.  No back pain.    Physical Exam  BP 137/70   Pulse 73   Ht 5' 5.5 (1.664 m)   Wt 102 lb 9.6 oz (46.5 kg)   SpO2 94%   BMI 16.81 kg/m   GEN: A/Ox3; pleasant , NAD, thin, cachectic.  Speaks in full sentences.   HEENT:  [place]/AT,  EACs-clear, TMs-wnl, NOSE-clear, THROAT-clear, no lesions, no postnasal drip or exudate noted.   NECK:  Supple w/ fair ROM; no JVD; normal carotid impulses w/o bruits; no thyromegaly or nodules palpated; no lymphadenopathy.    RESP  Clear but diminished througout P & A; w/o, wheezes/ rales/ or rhonchi. no accessory muscle use, no dullness to percussion  CARD:  RRR, no m/r/g, no peripheral edema, pulses intact, no cyanosis or clubbing.  GI:   Soft & nt; nml bowel sounds; no organomegaly or masses detected.   Musco: Warm bil, no deformities or joint swelling noted.   Neuro: alert, no focal deficits noted.    Skin: Warm, no lesions or rashes    Lab Results:  CBC    Component Value Date/Time   WBC 7.4 11/02/2023 1758   RBC 4.54 11/02/2023 1758   HGB 13.6 11/02/2023 1758   HCT 42.8 11/02/2023 1758   PLT 201 11/02/2023 1758   MCV 94.3 11/02/2023 1758   MCH 30.0 11/02/2023 1758   MCHC 31.8 11/02/2023 1758   RDW 12.5 11/02/2023 1758   LYMPHSABS 0.8 11/02/2023 1758   MONOABS 0.7 11/02/2023 1758   EOSABS 0.3 11/02/2023 1758   BASOSABS 0.0 11/02/2023 1758    BMET    Component Value Date/Time   NA 137 11/02/2023 1758   NA 143 11/10/2021 1339   K 3.8 11/02/2023 1758   CL 99 11/02/2023 1758   CO2 29 11/02/2023 1758   GLUCOSE 126 (H) 11/02/2023 1758   BUN 22 11/02/2023 1758   BUN 16 11/10/2021 1339   CREATININE 0.72 11/02/2023 1758   CALCIUM 9.9 11/02/2023 1758   GFRNONAA >60 11/02/2023  1758   GFRAA >60 11/12/2019 1134    BNP  Component Value Date/Time   BNP 17.0 11/02/2023 1800    ProBNP No results found for: PROBNP  Imaging: No results found.  Administration History     None          Latest Ref Rng & Units 03/26/2024    2:11 PM 11/22/2022    1:52 PM 10/07/2021    2:02 PM 02/19/2021   11:00 AM 01/13/2020    3:57 PM  PFT Results  FVC-Pre L 0.97  1.18  1.06  1.26  1.47   FVC-Predicted Pre % 32  38  34  40  46   FVC-Post L   1.11  1.21  1.51   FVC-Predicted Post %   35  38  47   Pre FEV1/FVC % % 89  89  88  89  81   Post FEV1/FCV % %   94  92  78   FEV1-Pre L 0.86  1.05  0.93  1.13  1.19   FEV1-Predicted Pre % 37  45  39  47  49   FEV1-Post L   1.05  1.11  1.17   DLCO uncorrected ml/min/mmHg 4.24  7.13  4.33  7.83  12.22   DLCO UNC% % 21  36  21  38  60   DLCO corrected ml/min/mmHg     12.22   DLCO COR %Predicted %     60   DLVA Predicted % 68  101  68  116  112   TLC L   2.24  3.45  3.49   TLC % Predicted %   43  66  67   RV % Predicted %   50  102  90     No results found for: NITRICOXIDE   Assessment & Plan:   Assessment & Plan IPF (idiopathic pulmonary fibrosis) (HCC)  Chronic respiratory failure with hypoxia (HCC)  Shortness of breath  Medication monitoring encounter  Assessment and Plan Assessment & Plan Idiopathic pulmonary fibrosis Progressive disease with worsening dyspnea despite supplemental oxygen. Imaging shows progression. Esbriet  used since 2023 with side effects. Discussed Jascayd as adjunct therapy, but she is hesitant due to cost and side effects.  - Ordered follow up liver function tests. - Continue Esbriet . - Discussed potential addition of Jascayd for prevention of fibrosis progression. Informational packet provided with AVS.  Chronic obstructive pulmonary disease COPD with increased dyspnea and difficulty using albuterol  inhaler. Nebulizer may be more effective. Previous tests showed airway responsiveness to  albuterol . Discussed Brovana  for maintenance therapy via nebulizer. - Refilled albuterol  for nebulizer use. - Prescribed Brovana  for nebulizer use twice daily. - Ordered new nebulizer parts.  Adverse effects of Esbriet  (nausea, decreased appetite) Esbriet  causing nausea and decreased appetite, leading to weight loss and fatigue. Odansetron used for nausea but causes constipation. She is hesitant to add new medications due to side effects and cost. - Continue current management with Esbriet . - Discussed potential use of megestrol for appetite stimulation.    Return in about 3 months (around 10/01/2024).  Candis Dandy, PA-C 07/03/2024      [1]  Allergies Allergen Reactions   Atorvastatin Other (See Comments)     Muscle aches. Has tried 2 different statins and unable to tolerate   Doxycycline Nausea And Vomiting    Intolerance due to perfenidone  [2]  Social History Tobacco Use  Smoking Status Former   Current packs/day: 0.00   Average packs/day: 0.5 packs/day for 15.0 years (7.5 ttl pk-yrs)   Types: Cigarettes  Start date: 31   Quit date: 42   Years since quitting: 41.0  Smokeless Tobacco Never   "

## 2024-07-03 NOTE — Patient Instructions (Signed)
 Start Brovana  nebs:  use twice daily.  May use once in a.m. if preferred to help with shortness of breath.  Continue Albuterol  nebs every 4-6 hours as needed.  Continue Esbriet .  Review Jascayd information.  Continue to supplement diet with multivitamin and Boost drinks.  Complete monitoring lab work as ordered.  Follow up with Dr. Jude in 3 months.

## 2024-07-04 ENCOUNTER — Ambulatory Visit: Payer: Self-pay

## 2024-07-09 ENCOUNTER — Telehealth (HOSPITAL_BASED_OUTPATIENT_CLINIC_OR_DEPARTMENT_OTHER): Payer: Self-pay

## 2024-07-09 NOTE — Telephone Encounter (Unsigned)
 Copied from CRM [id number]. Topic: Clinical - Prescription Issue >> Jul 09, 2024  2:34 PM Corean SAUNDERS wrote: Reason for CRM: Patient is requesting a call back from nurse or specialty pharmacy as she has questions about Pirfenidone  (ESBRIET ) 801 MG TABS

## 2024-07-12 ENCOUNTER — Other Ambulatory Visit (HOSPITAL_COMMUNITY): Payer: Self-pay

## 2024-07-12 ENCOUNTER — Telehealth: Payer: Self-pay

## 2024-07-12 NOTE — Progress Notes (Signed)
 Debra Jimenez was renewed and added in [facility]

## 2024-07-12 NOTE — Telephone Encounter (Signed)
 Received notification via specialty pharmacy encounter that pt's grant expired on 1/14. Pt reenrolled in Pulmonary Fibrosis grant through PAF:  Amount: $5000 Award Period: 07/04/24 - 07/12/25 BIN: [id number] PCN: [id number] Group: [id number] ID: [id number]  For pharmacy inquiries, contact PDMI at [phone]. For patient inquiries, contact PAF at [phone].

## 2024-07-12 NOTE — Telephone Encounter (Signed)
 Spoke to patient - she didn't recall her question, but she asks about the grant. Advised she was re-enrolled in a grant for pirfenidone . She verbalizes understanding. NFN.

## 2024-07-24 ENCOUNTER — Other Ambulatory Visit (HOSPITAL_COMMUNITY): Payer: Self-pay

## 2024-07-26 ENCOUNTER — Other Ambulatory Visit (HOSPITAL_COMMUNITY): Payer: Self-pay

## 2024-10-01 ENCOUNTER — Ambulatory Visit (HOSPITAL_BASED_OUTPATIENT_CLINIC_OR_DEPARTMENT_OTHER)
